# Patient Record
Sex: Female | Born: 1995 | Race: White | Hispanic: No | Marital: Married | Smoking: Former smoker
Health system: Southern US, Community
[De-identification: ages and names within clinical notes are randomized; demographics above are authoritative.]

## PROBLEM LIST (undated history)

## (undated) ENCOUNTER — Inpatient Hospital Stay (HOSPITAL_COMMUNITY): Payer: Self-pay

## (undated) DIAGNOSIS — K8020 Calculus of gallbladder without cholecystitis without obstruction: Secondary | ICD-10-CM

## (undated) DIAGNOSIS — Z8489 Family history of other specified conditions: Secondary | ICD-10-CM

## (undated) DIAGNOSIS — J40 Bronchitis, not specified as acute or chronic: Secondary | ICD-10-CM

## (undated) DIAGNOSIS — J189 Pneumonia, unspecified organism: Secondary | ICD-10-CM

---
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
## 2013-01-11 ENCOUNTER — Ambulatory Visit (INDEPENDENT_AMBULATORY_CARE_PROVIDER_SITE_OTHER): Payer: BC Managed Care – PPO | Admitting: Obstetrics & Gynecology

## 2013-01-11 VITALS — BP 126/78 | Wt 204.0 lb

## 2013-01-11 DIAGNOSIS — Z3402 Encounter for supervision of normal first pregnancy, second trimester: Secondary | ICD-10-CM

## 2013-01-11 DIAGNOSIS — Z3400 Encounter for supervision of normal first pregnancy, unspecified trimester: Secondary | ICD-10-CM

## 2013-01-11 DIAGNOSIS — O4442 Low lying placenta NOS or without hemorrhage, second trimester: Secondary | ICD-10-CM | POA: Insufficient documentation

## 2013-01-11 DIAGNOSIS — O4410 Placenta previa with hemorrhage, unspecified trimester: Secondary | ICD-10-CM

## 2013-01-11 NOTE — Patient Instructions (Signed)
Second Trimester of Pregnancy The second trimester is from week 13 through week 28, months 4 through 6. The second trimester is often a time when you feel your best. Your body has also adjusted to being pregnant, and you begin to feel better physically. Usually, morning sickness has lessened or quit completely, you may have more energy, and you may have an increase in appetite. The second trimester is also a time when the fetus is growing rapidly. At the end of the sixth month, the fetus is about 9 inches long and weighs about 1 pounds. You will likely begin to feel the baby move (quickening) between 18 and 20 weeks of the pregnancy. BODY CHANGES Your body goes through many changes during pregnancy. The changes vary from woman to woman.   Your weight will continue to increase. You will notice your lower abdomen bulging out.  You may begin to get stretch marks on your hips, abdomen, and breasts.  You may develop headaches that can be relieved by medicines approved by your caregiver.  You may urinate more often because the fetus is pressing on your bladder.  You may develop or continue to have heartburn as a result of your pregnancy.  You may develop constipation because certain hormones are causing the muscles that push waste through your intestines to slow down.  You may develop hemorrhoids or swollen, bulging veins (varicose veins).  You may have back pain because of the weight gain and pregnancy hormones relaxing your joints between the bones in your pelvis and as a result of a shift in weight and the muscles that support your balance.  Your breasts will continue to grow and be tender.  Your gums may bleed and may be sensitive to brushing and flossing.  Dark spots or blotches (chloasma, mask of pregnancy) may develop on your face. This will likely fade after the baby is born.  A dark line from your belly button to the pubic area (linea nigra) may appear. This will likely fade after the  baby is born. WHAT TO EXPECT AT YOUR PRENATAL VISITS During a routine prenatal visit:  You will be weighed to make sure you and the fetus are growing normally.  Your blood pressure will be taken.  Your abdomen will be measured to track your baby's growth.  The fetal heartbeat will be listened to.  Any test results from the previous visit will be discussed. Your caregiver may ask you:  How you are feeling.  If you are feeling the baby move.  If you have had any abnormal symptoms, such as leaking fluid, bleeding, severe headaches, or abdominal cramping.  If you have any questions. Other tests that may be performed during your second trimester include:  Blood tests that check for:  Low iron levels (anemia).  Gestational diabetes (between 24 and 28 weeks).  Rh antibodies.  Urine tests to check for infections, diabetes, or protein in the urine.  An ultrasound to confirm the proper growth and development of the baby.  An amniocentesis to check for possible genetic problems.  Fetal screens for spina bifida and Down syndrome. HOME CARE INSTRUCTIONS   Avoid all smoking, herbs, alcohol, and unprescribed drugs. These chemicals affect the formation and growth of the baby.  Follow your caregiver's instructions regarding medicine use. There are medicines that are either safe or unsafe to take during pregnancy.  Exercise only as directed by your caregiver. Experiencing uterine cramps is a good sign to stop exercising.  Continue to eat regular,   healthy meals.  Wear a good support bra for breast tenderness.  Do not use hot tubs, steam rooms, or saunas.  Wear your seat belt at all times when driving.  Avoid raw meat, uncooked cheese, cat litter boxes, and soil used by cats. These carry germs that can cause birth defects in the baby.  Take your prenatal vitamins.  Try taking a stool softener (if your caregiver approves) if you develop constipation. Eat more high-fiber foods,  such as fresh vegetables or fruit and whole grains. Drink plenty of fluids to keep your urine clear or pale yellow.  Take warm sitz baths to soothe any pain or discomfort caused by hemorrhoids. Use hemorrhoid cream if your caregiver approves.  If you develop varicose veins, wear support hose. Elevate your feet for 15 minutes, 3 4 times a day. Limit salt in your diet.  Avoid heavy lifting, wear low heel shoes, and practice good posture.  Rest with your legs elevated if you have leg cramps or low back pain.  Visit your dentist if you have not gone yet during your pregnancy. Use a soft toothbrush to brush your teeth and be gentle when you floss.  A sexual relationship may be continued unless your caregiver directs you otherwise.  Continue to go to all your prenatal visits as directed by your caregiver. SEEK MEDICAL CARE IF:   You have dizziness.  You have mild pelvic cramps, pelvic pressure, or nagging pain in the abdominal area.  You have persistent nausea, vomiting, or diarrhea.  You have a bad smelling vaginal discharge.  You have pain with urination. SEEK IMMEDIATE MEDICAL CARE IF:   You have a fever.  You are leaking fluid from your vagina.  You have spotting or bleeding from your vagina.  You have severe abdominal cramping or pain.  You have rapid weight gain or loss.  You have shortness of breath with chest pain.  You notice sudden or extreme swelling of your face, hands, ankles, feet, or legs.  You have not felt your baby move in over an hour.  You have severe headaches that do not go away with medicine.  You have vision changes. Document Released: 01/22/2001 Document Revised: 09/30/2012 Document Reviewed: 03/31/2012 [employer] Patient Information 2014 [employer].  

## 2013-01-11 NOTE — Progress Notes (Signed)
P-89 

## 2013-01-11 NOTE — Progress Notes (Signed)
[place] in 2 weeks f/u low-lying placenta. 1 hr GTT 3 weeks.

## 2013-01-19 ENCOUNTER — Telehealth: Payer: Self-pay | Admitting: *Deleted

## 2013-01-19 NOTE — Telephone Encounter (Signed)
Pt's mother called in to adv pt had "white bumps in her throat" 2 Friday's ago and a few days after noticing the bumps the pt began having diarrhea. Pt's mother adv she didn't think she could be seen by PCP since she was pregnant. I adv pt's mother that I would call PCP to see if they would see her since Dr. Debroah Loop wouldn't be in [place] for another week. I called Nicola Girt at [facility] and they agreed to see pt for sore throat and diarrhea. I called Meriam Sprague (Pt's mother) back to adv to call [facility] to make appt. She expressed understanding.

## 2013-01-29 ENCOUNTER — Ambulatory Visit (HOSPITAL_COMMUNITY)
Admission: RE | Admit: 2013-01-29 | Discharge: 2013-01-29 | Disposition: A | Discharge status: Home / Self Care | Payer: BC Managed Care – PPO | Source: Ambulatory Visit | Attending: Obstetrics & Gynecology | Admitting: Obstetrics & Gynecology

## 2013-01-29 DIAGNOSIS — Z3689 Encounter for other specified antenatal screening: Secondary | ICD-10-CM | POA: Insufficient documentation

## 2013-01-29 DIAGNOSIS — O4400 Placenta previa specified as without hemorrhage, unspecified trimester: Secondary | ICD-10-CM | POA: Insufficient documentation

## 2013-01-29 DIAGNOSIS — Z3402 Encounter for supervision of normal first pregnancy, second trimester: Secondary | ICD-10-CM

## 2013-02-02 ENCOUNTER — Ambulatory Visit (INDEPENDENT_AMBULATORY_CARE_PROVIDER_SITE_OTHER): Payer: BC Managed Care – PPO | Admitting: Obstetrics & Gynecology

## 2013-02-02 VITALS — BP 121/76 | Wt 201.0 lb

## 2013-02-02 DIAGNOSIS — Z3482 Encounter for supervision of other normal pregnancy, second trimester: Secondary | ICD-10-CM

## 2013-02-02 DIAGNOSIS — Z3480 Encounter for supervision of other normal pregnancy, unspecified trimester: Secondary | ICD-10-CM

## 2013-02-02 DIAGNOSIS — N898 Other specified noninflammatory disorders of vagina: Secondary | ICD-10-CM

## 2013-02-02 LAB — CBC
HCT: 37 % (ref 36.0–46.0)
Hemoglobin: 12.6 g/dL (ref 12.0–15.0)
MCH: 27.8 pg (ref 26.0–34.0)
MCV: 81.5 fL (ref 78.0–100.0)
Platelets: 215 10*3/uL (ref 150–400)
RBC: 4.54 MIL/uL (ref 3.87–5.11)
RDW: 13.8 % (ref 11.5–15.5)
WBC: 7.9 10*3/uL (ref 4.0–10.5)

## 2013-02-02 NOTE — Progress Notes (Signed)
P - 92 - Pt states she has heavy discharge and some vaginal itching - 1Hr GTT today

## 2013-02-02 NOTE — Addendum Note (Signed)
Addended by: Arne Cleveland on: 02/02/2013 01:27 PM   Modules accepted: Orders

## 2013-02-02 NOTE — Progress Notes (Signed)
Itch and vaginal discharge, no odor. Nasal congestion, no fever. No discharge on exam, no ROM, wet prep done. [place] nl no previa

## 2013-02-02 NOTE — Patient Instructions (Signed)
Third Trimester of Pregnancy  The third trimester is from week 29 through week 42, months 7 through 9. The third trimester is a time when the fetus is growing rapidly. At the end of the ninth month, the fetus is about 20 inches in length and weighs 6 10 pounds.   BODY CHANGES  Your body goes through many changes during pregnancy. The changes vary from woman to woman.    Your weight will continue to increase. You can expect to gain 25 35 pounds (11 16 kg) by the end of the pregnancy.   You may begin to get stretch marks on your hips, abdomen, and breasts.   You may urinate more often because the fetus is moving lower into your pelvis and pressing on your bladder.   You may develop or continue to have heartburn as a result of your pregnancy.   You may develop constipation because certain hormones are causing the muscles that push waste through your intestines to slow down.   You may develop hemorrhoids or swollen, bulging veins (varicose veins).   You may have pelvic pain because of the weight gain and pregnancy hormones relaxing your joints between the bones in your pelvis. Back aches may result from over exertion of the muscles supporting your posture.   Your breasts will continue to grow and be tender. A yellow discharge may leak from your breasts called colostrum.   Your belly button may stick out.   You may feel short of breath because of your expanding uterus.   You may notice the fetus "dropping," or moving lower in your abdomen.   You may have a bloody mucus discharge. This usually occurs a few days to a week before labor begins.   Your cervix becomes thin and soft (effaced) near your due date.  WHAT TO EXPECT AT YOUR PRENATAL EXAMS   You will have prenatal exams every 2 weeks until week 36. Then, you will have weekly prenatal exams. During a routine prenatal visit:   You will be weighed to make sure you and the fetus are growing normally.   Your blood pressure is taken.   Your abdomen will be  measured to track your baby's growth.   The fetal heartbeat will be listened to.   Any test results from the previous visit will be discussed.   You may have a cervical check near your due date to see if you have effaced.  At around 36 weeks, your caregiver will check your cervix. At the same time, your caregiver will also perform a test on the secretions of the vaginal tissue. This test is to determine if a type of bacteria, Group B streptococcus, is present. Your caregiver will explain this further.  Your caregiver may ask you:   What your birth plan is.   How you are feeling.   If you are feeling the baby move.   If you have had any abnormal symptoms, such as leaking fluid, bleeding, severe headaches, or abdominal cramping.   If you have any questions.  Other tests or screenings that may be performed during your third trimester include:   Blood tests that check for low iron levels (anemia).   Fetal testing to check the health, activity level, and growth of the fetus. Testing is done if you have certain medical conditions or if there are problems during the pregnancy.  FALSE LABOR  You may feel small, irregular contractions that eventually go away. These are called Braxton Hicks contractions, or   false labor. Contractions may last for hours, days, or even weeks before true labor sets in. If contractions come at regular intervals, intensify, or become painful, it is best to be seen by your caregiver.   SIGNS OF LABOR    Menstrual-like cramps.   Contractions that are 5 minutes apart or less.   Contractions that start on the top of the uterus and spread down to the lower abdomen and back.   A sense of increased pelvic pressure or back pain.   A watery or bloody mucus discharge that comes from the vagina.  If you have any of these signs before the 37th week of pregnancy, call your caregiver right away. You need to go to the hospital to get checked immediately.  HOME CARE INSTRUCTIONS    Avoid all  smoking, herbs, alcohol, and unprescribed drugs. These chemicals affect the formation and growth of the baby.   Follow your caregiver's instructions regarding medicine use. There are medicines that are either safe or unsafe to take during pregnancy.   Exercise only as directed by your caregiver. Experiencing uterine cramps is a good sign to stop exercising.   Continue to eat regular, healthy meals.   Wear a good support bra for breast tenderness.   Do not use hot tubs, steam rooms, or saunas.   Wear your seat belt at all times when driving.   Avoid raw meat, uncooked cheese, cat litter boxes, and soil used by cats. These carry germs that can cause birth defects in the baby.   Take your prenatal vitamins.   Try taking a stool softener (if your caregiver approves) if you develop constipation. Eat more high-fiber foods, such as fresh vegetables or fruit and whole grains. Drink plenty of fluids to keep your urine clear or pale yellow.   Take warm sitz baths to soothe any pain or discomfort caused by hemorrhoids. Use hemorrhoid cream if your caregiver approves.   If you develop varicose veins, wear support hose. Elevate your feet for 15 minutes, 3 4 times a day. Limit salt in your diet.   Avoid heavy lifting, wear low heal shoes, and practice good posture.   Rest a lot with your legs elevated if you have leg cramps or low back pain.   Visit your dentist if you have not gone during your pregnancy. Use a soft toothbrush to brush your teeth and be gentle when you floss.   A sexual relationship may be continued unless your caregiver directs you otherwise.   Do not travel far distances unless it is absolutely necessary and only with the approval of your caregiver.   Take prenatal classes to understand, practice, and ask questions about the labor and delivery.   Make a trial run to the hospital.   Pack your hospital bag.   Prepare the baby's nursery.   Continue to go to all your prenatal visits as directed  by your caregiver.  SEEK MEDICAL CARE IF:   You are unsure if you are in labor or if your water has broken.   You have dizziness.   You have mild pelvic cramps, pelvic pressure, or nagging pain in your abdominal area.   You have persistent nausea, vomiting, or diarrhea.   You have a bad smelling vaginal discharge.   You have pain with urination.  SEEK IMMEDIATE MEDICAL CARE IF:    You have a fever.   You are leaking fluid from your vagina.   You have spotting or bleeding from your vagina.     You have severe abdominal cramping or pain.   You have rapid weight loss or gain.   You have shortness of breath with chest pain.   You notice sudden or extreme swelling of your face, hands, ankles, feet, or legs.   You have not felt your baby move in over an hour.   You have severe headaches that do not go away with medicine.   You have vision changes.  Document Released: 01/22/2001 Document Revised: 09/30/2012 Document Reviewed: 03/31/2012  [employer] Patient Information 2014 [employer].

## 2013-02-03 LAB — HIV ANTIBODY (ROUTINE TESTING W REFLEX): HIV: NONREACTIVE

## 2013-02-11 NOTE — L&D Delivery Note (Signed)
Delivery Note At 7:40 AM a healthy female was delivered via Vaginal, Spontaneous Delivery (Presentation: Left Occiput Anterior).  APGAR: 8, 9; weight .   Placenta status: Intact, Spontaneous.  Cord: 3 vessels with the following complications: None.  Cord pH: obtained and pending  Anesthesia: Epidural  Episiotomy: None Lacerations: none Suture Repair: na Est. Blood Loss (350mL):   Mom to postpartum.  Baby to Couplet care / Skin to Skin.  Wenda LowJoyner, James 04/13/2013, 7:52 AM  I have seen and examined this patient and agree with above documentation in the resident's note. Pt pushed with good maternal effort to deliver a liveborn female over intact perineum. Compound presentation of a hand noted and reduced on the perineum. Baby delivered without difficulty. Baby to maternal abdomen. Delayed cord clamping performed and cut by FOB. Placenta delivered intact with 3V cord. No tears or complications. Mom to postpartum and baby to skin to skin.   Rulon AbideKeli Lyla Jasek, M.D. [facility] 04/13/2013 8:37 AM

## 2013-02-15 ENCOUNTER — Ambulatory Visit (INDEPENDENT_AMBULATORY_CARE_PROVIDER_SITE_OTHER): Payer: BC Managed Care – PPO | Admitting: Obstetrics & Gynecology

## 2013-02-15 VITALS — BP 122/75 | Wt 204.0 lb

## 2013-02-15 DIAGNOSIS — Z3403 Encounter for supervision of normal first pregnancy, third trimester: Secondary | ICD-10-CM

## 2013-02-15 DIAGNOSIS — Z3400 Encounter for supervision of normal first pregnancy, unspecified trimester: Secondary | ICD-10-CM

## 2013-02-15 NOTE — Progress Notes (Signed)
Considering water birth. Will sign up for class

## 2013-02-15 NOTE — Patient Instructions (Signed)
Third Trimester of Pregnancy  The third trimester is from week 29 through week 42, months 7 through 9. The third trimester is a time when the fetus is growing rapidly. At the end of the ninth month, the fetus is about 20 inches in length and weighs 6 10 pounds.   BODY CHANGES  Your body goes through many changes during pregnancy. The changes vary from woman to woman.    Your weight will continue to increase. You can expect to gain 25 35 pounds (11 16 kg) by the end of the pregnancy.   You may begin to get stretch marks on your hips, abdomen, and breasts.   You may urinate more often because the fetus is moving lower into your pelvis and pressing on your bladder.   You may develop or continue to have heartburn as a result of your pregnancy.   You may develop constipation because certain hormones are causing the muscles that push waste through your intestines to slow down.   You may develop hemorrhoids or swollen, bulging veins (varicose veins).   You may have pelvic pain because of the weight gain and pregnancy hormones relaxing your joints between the bones in your pelvis. Back aches may result from over exertion of the muscles supporting your posture.   Your breasts will continue to grow and be tender. A yellow discharge may leak from your breasts called colostrum.   Your belly button may stick out.   You may feel short of breath because of your expanding uterus.   You may notice the fetus "dropping," or moving lower in your abdomen.   You may have a bloody mucus discharge. This usually occurs a few days to a week before labor begins.   Your cervix becomes thin and soft (effaced) near your due date.  WHAT TO EXPECT AT YOUR PRENATAL EXAMS   You will have prenatal exams every 2 weeks until week 36. Then, you will have weekly prenatal exams. During a routine prenatal visit:   You will be weighed to make sure you and the fetus are growing normally.   Your blood pressure is taken.   Your abdomen will be  measured to track your baby's growth.   The fetal heartbeat will be listened to.   Any test results from the previous visit will be discussed.   You may have a cervical check near your due date to see if you have effaced.  At around 36 weeks, your caregiver will check your cervix. At the same time, your caregiver will also perform a test on the secretions of the vaginal tissue. This test is to determine if a type of bacteria, Group B streptococcus, is present. Your caregiver will explain this further.  Your caregiver may ask you:   What your birth plan is.   How you are feeling.   If you are feeling the baby move.   If you have had any abnormal symptoms, such as leaking fluid, bleeding, severe headaches, or abdominal cramping.   If you have any questions.  Other tests or screenings that may be performed during your third trimester include:   Blood tests that check for low iron levels (anemia).   Fetal testing to check the health, activity level, and growth of the fetus. Testing is done if you have certain medical conditions or if there are problems during the pregnancy.  FALSE LABOR  You may feel small, irregular contractions that eventually go away. These are called Braxton Hicks contractions, or   false labor. Contractions may last for hours, days, or even weeks before true labor sets in. If contractions come at regular intervals, intensify, or become painful, it is best to be seen by your caregiver.   SIGNS OF LABOR    Menstrual-like cramps.   Contractions that are 5 minutes apart or less.   Contractions that start on the top of the uterus and spread down to the lower abdomen and back.   A sense of increased pelvic pressure or back pain.   A watery or bloody mucus discharge that comes from the vagina.  If you have any of these signs before the 37th week of pregnancy, call your caregiver right away. You need to go to the hospital to get checked immediately.  HOME CARE INSTRUCTIONS    Avoid all  smoking, herbs, alcohol, and unprescribed drugs. These chemicals affect the formation and growth of the baby.   Follow your caregiver's instructions regarding medicine use. There are medicines that are either safe or unsafe to take during pregnancy.   Exercise only as directed by your caregiver. Experiencing uterine cramps is a good sign to stop exercising.   Continue to eat regular, healthy meals.   Wear a good support bra for breast tenderness.   Do not use hot tubs, steam rooms, or saunas.   Wear your seat belt at all times when driving.   Avoid raw meat, uncooked cheese, cat litter boxes, and soil used by cats. These carry germs that can cause birth defects in the baby.   Take your prenatal vitamins.   Try taking a stool softener (if your caregiver approves) if you develop constipation. Eat more high-fiber foods, such as fresh vegetables or fruit and whole grains. Drink plenty of fluids to keep your urine clear or pale yellow.   Take warm sitz baths to soothe any pain or discomfort caused by hemorrhoids. Use hemorrhoid cream if your caregiver approves.   If you develop varicose veins, wear support hose. Elevate your feet for 15 minutes, 3 4 times a day. Limit salt in your diet.   Avoid heavy lifting, wear low heal shoes, and practice good posture.   Rest a lot with your legs elevated if you have leg cramps or low back pain.   Visit your dentist if you have not gone during your pregnancy. Use a soft toothbrush to brush your teeth and be gentle when you floss.   A sexual relationship may be continued unless your caregiver directs you otherwise.   Do not travel far distances unless it is absolutely necessary and only with the approval of your caregiver.   Take prenatal classes to understand, practice, and ask questions about the labor and delivery.   Make a trial run to the hospital.   Pack your hospital bag.   Prepare the baby's nursery.   Continue to go to all your prenatal visits as directed  by your caregiver.  SEEK MEDICAL CARE IF:   You are unsure if you are in labor or if your water has broken.   You have dizziness.   You have mild pelvic cramps, pelvic pressure, or nagging pain in your abdominal area.   You have persistent nausea, vomiting, or diarrhea.   You have a bad smelling vaginal discharge.   You have pain with urination.  SEEK IMMEDIATE MEDICAL CARE IF:    You have a fever.   You are leaking fluid from your vagina.   You have spotting or bleeding from your vagina.     You have severe abdominal cramping or pain.   You have rapid weight loss or gain.   You have shortness of breath with chest pain.   You notice sudden or extreme swelling of your face, hands, ankles, feet, or legs.   You have not felt your baby move in over an hour.   You have severe headaches that do not go away with medicine.   You have vision changes.  Document Released: 01/22/2001 Document Revised: 09/30/2012 Document Reviewed: 03/31/2012  [employer] Patient Information 2014 [employer].

## 2013-02-15 NOTE — Progress Notes (Signed)
P - 86 - Pt interested in Water birth

## 2013-03-02 ENCOUNTER — Encounter: Payer: BC Managed Care – PPO | Admitting: Obstetrics & Gynecology

## 2013-03-09 ENCOUNTER — Ambulatory Visit (INDEPENDENT_AMBULATORY_CARE_PROVIDER_SITE_OTHER): Payer: BC Managed Care – PPO | Admitting: Obstetrics & Gynecology

## 2013-03-09 VITALS — BP 130/90 | Wt 204.0 lb

## 2013-03-09 DIAGNOSIS — Z3400 Encounter for supervision of normal first pregnancy, unspecified trimester: Secondary | ICD-10-CM

## 2013-03-09 NOTE — Patient Instructions (Signed)
Preeclampsia and Eclampsia °Preeclampsia is a condition of high blood pressure during pregnancy. It can happen at 20 weeks or later in pregnancy. If high blood pressure occurs in the second half of pregnancy with no other symptoms, it is called gestational hypertension and goes away after the baby is born. If any of the symptoms listed below develop with gestational hypertension, it is then called preeclampsia. Eclampsia (convulsions) may follow preeclampsia. This is one of the reasons for regular prenatal checkups. Early diagnosis and treatment are very important to prevent eclampsia. °CAUSES  °There is no known cause of preeclampsia/eclampsia in pregnancy. There are several known conditions that may put the pregnant woman at risk, such as: °· The first pregnancy. °· Having preeclampsia in a past pregnancy. °· Having lasting (chronic) high blood pressure. °· Having multiples (twins, triplets). °· Being age 35 or older. °· African American ethnic background. °· Having kidney disease or diabetes. °· Medical conditions such as lupus or blood diseases. °· Being overweight (obese). °SYMPTOMS  °· High blood pressure. °· Headaches. °· Sudden weight gain. °· Swelling of hands, face, legs, and feet. °· Protein in the urine. °· Feeling sick to your stomach (nauseous) and throwing up (vomiting). °· Vision problems (blurred or double vision). °· Numbness in the face, arms, legs, and feet. °· Dizziness. °· Slurred speech. °· Preeclampsia can cause growth retardation in the fetus. °· Separation (abruption) of the placenta. °· Not enough fluid in the amniotic sac (oligohydramnios). °· Sensitivity to bright lights. °· Belly (abdominal) pain. °DIAGNOSIS  °If protein is found in the urine in the second half of pregnancy, this is considered preeclampsia. Other symptoms mentioned above may also be present. °TREATMENT  °It is necessary to treat this. °· Your caregiver may prescribe bed rest early in this condition. Plenty of rest and  salt restriction may be all that is needed. °· Medicines may be necessary to lower blood pressure if the condition does not respond to more conservative measures. °· In more severe cases, hospitalization may be needed: °· For treatment of blood pressure. °· To control fluid retention. °· To monitor the baby to see if the condition is causing harm to the baby. °· Hospitalization is the best way to treat the first sign of preeclampsia. This is so the mother and baby can be watched closely and blood tests can be done effectively and correctly. °· If the condition becomes severe, it may be necessary to induce labor or to remove the infant by surgical means (cesarean section). The best cure for preeclampsia/eclampsia is to deliver the baby. °Preeclampsia and eclampsia involve risks to mother and infant. Your caregiver will discuss these risks with you. Together, you can work out the best possible approach to your problems. Make sure you keep your prenatal visits as scheduled. Not keeping appointments could result in a chronic or permanent injury, pain, disability to you, and death or injury to you or your unborn baby. If there is any problem keeping the appointment, you must call to reschedule. °HOME CARE INSTRUCTIONS  °· Keep your prenatal appointments and tests as scheduled. °· Tell your caregiver if you have any of the above risk factors. °· Get plenty of rest and sleep. °· Eat a balanced diet that is low in salt, and do not add salt to your food. °· Avoid stressful situations. °· Only take over-the-counter and prescriptions medicines for pain, discomfort, or fever as directed by your caregiver. °SEEK IMMEDIATE MEDICAL CARE IF:  °· You develop severe swelling   anywhere in the body. This usually occurs in the legs. °· You gain 05 lb/2.3 kg or more in a week. °· You develop a severe headache, dizziness, problems with your vision, or confusion. °· You have abdominal pain, nausea, or vomiting. °· You have a seizure. °· You  have trouble moving any part of your body, or you develop numbness or problems speaking. °· You have bruising or abnormal bleeding from anywhere in the body. °· You develop a stiff neck. °· You pass out. °MAKE SURE YOU:  °· Understand these instructions. °· Will watch your condition. °· Will get help right away if you are not doing well or get worse. °Document Released: 01/26/2000 Document Revised: 04/22/2011 Document Reviewed: 09/11/2007 °ExitCare® Patient Information ©2014 [employer]. ° °

## 2013-03-09 NOTE — Progress Notes (Signed)
RTC 1 week. PIH precautions.

## 2013-03-09 NOTE — Progress Notes (Signed)
P - 108 BP recheck - 118//78 P- 96

## 2013-03-15 ENCOUNTER — Inpatient Hospital Stay (HOSPITAL_COMMUNITY)
Admission: AD | Admit: 2013-03-15 | Discharge: 2013-03-15 | Disposition: A | Discharge status: Home / Self Care | Payer: BC Managed Care – PPO | Source: Ambulatory Visit | Attending: Obstetrics & Gynecology | Admitting: Obstetrics & Gynecology

## 2013-03-15 ENCOUNTER — Encounter: Payer: BC Managed Care – PPO | Admitting: Obstetrics & Gynecology

## 2013-03-15 ENCOUNTER — Inpatient Hospital Stay (HOSPITAL_COMMUNITY): Payer: BC Managed Care – PPO

## 2013-03-15 ENCOUNTER — Encounter (HOSPITAL_COMMUNITY): Payer: Self-pay | Admitting: *Deleted

## 2013-03-15 DIAGNOSIS — K8020 Calculus of gallbladder without cholecystitis without obstruction: Secondary | ICD-10-CM

## 2013-03-15 DIAGNOSIS — R748 Abnormal levels of other serum enzymes: Secondary | ICD-10-CM | POA: Insufficient documentation

## 2013-03-15 DIAGNOSIS — R0789 Other chest pain: Secondary | ICD-10-CM

## 2013-03-15 DIAGNOSIS — O9989 Other specified diseases and conditions complicating pregnancy, childbirth and the puerperium: Secondary | ICD-10-CM | POA: Insufficient documentation

## 2013-03-15 DIAGNOSIS — Z87891 Personal history of nicotine dependence: Secondary | ICD-10-CM | POA: Insufficient documentation

## 2013-03-15 DIAGNOSIS — M545 Low back pain, unspecified: Secondary | ICD-10-CM | POA: Insufficient documentation

## 2013-03-15 DIAGNOSIS — R109 Unspecified abdominal pain: Secondary | ICD-10-CM | POA: Insufficient documentation

## 2013-03-15 LAB — CBC
HCT: 36.6 % (ref 36.0–46.0)
HEMOGLOBIN: 12.2 g/dL (ref 12.0–15.0)
MCH: 27.2 pg (ref 26.0–34.0)
MCHC: 33.3 g/dL (ref 30.0–36.0)
MCV: 81.7 fL (ref 78.0–100.0)
Platelets: 177 10*3/uL (ref 150–400)
RBC: 4.48 MIL/uL (ref 3.87–5.11)
RDW: 13.2 % (ref 11.5–15.5)
WBC: 7.3 10*3/uL (ref 4.0–10.5)

## 2013-03-15 LAB — COMPREHENSIVE METABOLIC PANEL
ALBUMIN: 2.7 g/dL — AB (ref 3.5–5.2)
ALK PHOS: 223 U/L — AB (ref 39–117)
ALT: 56 U/L — AB (ref 0–35)
AST: 97 U/L — AB (ref 0–37)
BUN: 3 mg/dL — ABNORMAL LOW (ref 6–23)
CALCIUM: 8.9 mg/dL (ref 8.4–10.5)
CO2: 24 mEq/L (ref 19–32)
Chloride: 102 mEq/L (ref 96–112)
Creatinine, Ser: 0.57 mg/dL (ref 0.50–1.10)
GFR calc non Af Amer: >90 mL/min (ref >90)
Glucose, Bld: 95 mg/dL (ref 70–99)
POTASSIUM: 4 meq/L (ref 3.7–5.3)
SODIUM: 137 meq/L (ref 137–147)
TOTAL PROTEIN: 7.3 g/dL (ref 6.0–8.3)
Total Bilirubin: 0.8 mg/dL (ref 0.3–1.2)

## 2013-03-15 LAB — URINALYSIS, ROUTINE W REFLEX MICROSCOPIC
BILIRUBIN URINE: NEGATIVE
Glucose, UA: NEGATIVE mg/dL
Hgb urine dipstick: NEGATIVE
KETONES UR: NEGATIVE mg/dL
LEUKOCYTES UA: NEGATIVE
NITRITE: NEGATIVE
PH: 7 (ref 5.0–8.0)
Protein, ur: NEGATIVE mg/dL
Specific Gravity, Urine: 1.015 (ref 1.005–1.030)
UROBILINOGEN UA: 2 mg/dL — AB (ref 0.0–1.0)

## 2013-03-15 MED ORDER — CYCLOBENZAPRINE HCL 5 MG PO TABS: 5.0000 mg | ORAL_TABLET | Freq: Three times a day (TID) | ORAL | Status: DC | PRN

## 2013-03-15 MED ORDER — OXYCODONE-ACETAMINOPHEN 5-325 MG PO TABS: 1.0000 | ORAL_TABLET | ORAL | Status: DC | PRN

## 2013-03-15 NOTE — MAU Provider Note (Signed)
Attestation of Attending Supervision of Advanced Practitioner (CNM/NP): Evaluation and management procedures were performed by the Advanced Practitioner under my supervision and collaboration.  I have reviewed the Advanced Practitioner's note and chart, and I agree with the management and plan.  HARRAWAY-SMITH, Janell Keeling 4:32 PM

## 2013-03-15 NOTE — Discharge Instructions (Signed)
Cholelithiasis °Cholelithiasis (also called gallstones) is a form of gallbladder disease in which gallstones form in your gallbladder. The gallbladder is an organ that stores bile made in the liver, which helps digest fats. Gallstones begin as small crystals and slowly grow into stones. Gallstone pain occurs when the gallbladder spasms and a gallstone is blocking the duct. Pain can also occur when a stone passes out of the duct.  °RISK FACTORS °· Being female.   °· Having multiple pregnancies. Health care providers sometimes advise removing diseased gallbladders before future pregnancies.   °· Being obese. °· Eating a diet heavy in fried foods and fat.   °· Being older than 60 years and increasing age.   °· Prolonged use of medicines containing female hormones.   °· Having diabetes mellitus.   °· Rapidly losing weight.   °· Having a family history of gallstones (heredity).   °SYMPTOMS °· Nausea.   °· Vomiting. °· Abdominal pain.   °· Yellowing of the skin (jaundice).   °· Sudden pain. It may persist from several minutes to several hours. °· Fever.   °· Tenderness to the touch.  °In some cases, when gallstones do not move into the bile duct, people have no pain or symptoms. These are called "silent" gallstones.  °TREATMENT °Silent gallstones do not need treatment. In severe cases, emergency surgery may be required. Options for treatment include: °· Surgery to remove the gallbladder. This is the most common treatment. °· Medicines. These do not always work and may take 6 12 months or more to work. °· Shock wave treatment (extracorporeal biliary lithotripsy). In this treatment an ultrasound machine sends shock waves to the gallbladder to break gallstones into smaller pieces that can pass into the intestines or be dissolved by medicine. °HOME CARE INSTRUCTIONS  °· Only take over-the-counter or prescription medicines for pain, discomfort, or fever as directed by your health care provider.   °· Follow a low-fat diet until  seen again by your health care provider. Fat causes the gallbladder to contract, which can result in pain.   °· Follow up with your health care provider as directed. Attacks are almost always recurrent and surgery is usually required for permanent treatment.   °SEEK IMMEDIATE MEDICAL CARE IF:  °· Your pain increases and is not controlled by medicines.   °· You have a fever or persistent symptoms for more than 2 3 days.   °· You have a fever and your symptoms suddenly get worse.   °· You have persistent nausea and vomiting.   °MAKE SURE YOU:  °· Understand these instructions. °· Will watch your condition. °· Will get help right away if you are not doing well or get worse. °Document Released: 01/24/2005 Document Revised: 09/30/2012 Document Reviewed: 07/22/2012 °ExitCare® Patient Information ©2014 [employer]. ° °

## 2013-03-15 NOTE — MAU Provider Note (Signed)
 @MAUPATCONTACT @  Chief Complaint:  Bilateral Upper quadrant pain(lower rib) pain  Sydney [name] is  18 y.o. G1P0 at 2962w6d presents complaining of Bilateral Upper quadrant(lower rib) pain.   Pt complains of bilateral upper quadrant(lower rib) pain. 1st attack was on Saturday, sever 9/10 lasted 30 mins. Spontaneous onset, no aggravating factors, relieved by laying down or sitting al the way up. Pt describes the pain as sharp and stabbing. Also complains of lower back pain. Had another episode today morning. Currently pt is pain free with no complaints. She suffered from a similar complaint in her 2nd trimester, she was told it was related to the uterus enlarging Denies any respiratory, GI or urinary symptoms Denies uterine contractions, +fetal movt (says baby moves more during pain episodes), no Vag bleeding or LOF  Obstetrical/Gynecological History: OB History   Grav Para Term Preterm Abortions TAB SAB Ect Mult Living   1              Past Medical History: Past Medical History  Diagnosis Date  . Medical history non-contributory     Past Surgical History: Past Surgical History  Procedure Laterality Date  . No past surgeries      Family History: Family History  Problem Relation Age of Onset  . Healthy Mother   . Healthy Father   . Healthy Brother   . Hypertension Maternal Grandmother   . Stroke Maternal Grandfather   . Diabetes Paternal Grandmother   . Diabetes Paternal Grandfather     Social History: History  Substance Use Topics  . Smoking status: Former Smoker -- 1.00 packs/day    Types: Cigarettes  . Smokeless tobacco: Not on file  . Alcohol Use: No    Allergies:  Allergies  Allergen Reactions  . Latex     Meds:  Prescriptions prior to admission  Medication Sig Dispense Refill  . Prenatal Vit-Fe Fumarate-FA (MULTIVITAMIN-PRENATAL) 27-0.8 MG TABS tablet Take 1 tablet by mouth daily at 12 noon.  30 each  5    Review of Systems -   Review of Systems   Constitutional: Negative for fever, chills,  HENT: Negative for , nosebleeds, congestion, sore throat, neck pain, tinnitus and ear discharge.   Eyes: Negative for blurred vision, double vision, photophobia, pain, discharge and redness.  Respiratory: Negative for cough, hemoptysis, sputum production, shortness of breath, wheezing and stridor.   Cardiovascular: Negative for chest pain, palpitations Gastrointestinal: Negative for abdominal pain heartburn, nausea, vomiting, diarrhea, constipation, blood in stool Genitourinary: Negative for dysuria, urgency, frequency, hematuria and flank pain.  Musculoskeletal: Negative for myalgias,  joint pain and falls.  positive for mid back pain  Physical Exam  Blood pressure 126/70, pulse 96, temperature 97.6 F (36.4 C), temperature source Oral, resp. rate 18, last menstrual period 07/21/2012. GENERAL: Well-developed, well-nourished female in no acute distress.  LUNGS: Clear to auscultation bilaterally.  HEART: Regular rate and rhythm. ABDOMEN: Soft, nondistended, gravid. Mild Tenderness in the left upper quadrant FHT:  Baseline rate 130 bpm   Variability moderate  Accelerations present   Decelerations none Contractions: none   Labs: No results found for this or any previous visit (from the past 24 hour(s)). Imaging Studies:  No results found.  Assessment: Sydney [name] is  18 y.o. G1P0 at 4562w6d presents with bilateral upper quadrant(lower rib) pain History and P.E suggests musculoskeletal origin  Plan: Pt reassurance Prescription for Pain Relief  Sallyanne HaversMuazu, Aisha 2/2/20159:03 AM  Evaluation and management procedures were performed by Resident physician under my supervision/collaboration. Chart reviewed,  patient examined by me and I agree with management and plan.  ADDENDUM: SUBJECTIVE: Has had the pain since 3 months gestation and this episode Had vomiting x2 this am. No pain at rest while in MAU. Marland Kitchen Also has reflux sx on occasion and has not  yet tried Tums.  OBJECTIVE: Costal margin mild TTP bilaterally. No epigastric or fundal tenderness.  Results for orders placed during the hospital encounter of 03/15/13 (from the past 24 hour(s))  URINALYSIS, ROUTINE W REFLEX MICROSCOPIC     Status: Abnormal   Collection Time    03/15/13  8:28 AM      Result Value Range   Color, Urine YELLOW  YELLOW   APPearance CLOUDY (*) CLEAR   Specific Gravity, Urine 1.015  1.005 - 1.030   pH 7.0  5.0 - 8.0   Glucose, UA NEGATIVE  NEGATIVE mg/dL   Hgb urine dipstick NEGATIVE  NEGATIVE   Bilirubin Urine NEGATIVE  NEGATIVE   Ketones, ur NEGATIVE  NEGATIVE mg/dL   Protein, ur NEGATIVE  NEGATIVE mg/dL   Urobilinogen, UA 2.0 (*) 0.0 - 1.0 mg/dL   Nitrite NEGATIVE  NEGATIVE   Leukocytes, UA NEGATIVE  NEGATIVE  CBC     Status: None   Collection Time    03/15/13 10:08 AM      Result Value Range   WBC 7.3  4.0 - 10.5 K/uL   RBC 4.48  3.87 - 5.11 MIL/uL   Hemoglobin 12.2  12.0 - 15.0 g/dL   HCT 16.1  09.6 - 04.5 %   MCV 81.7  78.0 - 100.0 fL   MCH 27.2  26.0 - 34.0 pg   MCHC 33.3  30.0 - 36.0 g/dL   RDW 40.9  81.1 - 91.4 %   Platelets 177  150 - 400 K/uL  COMPREHENSIVE METABOLIC PANEL     Status: Abnormal   Collection Time    03/15/13 10:08 AM      Result Value Range   Sodium 137  137 - 147 mEq/L   Potassium 4.0  3.7 - 5.3 mEq/L   Chloride 102  96 - 112 mEq/L   CO2 24  19 - 32 mEq/L   Glucose, Bld 95  70 - 99 mg/dL   BUN 3 (*) 6 - 23 mg/dL   Creatinine, Ser 7.82  0.50 - 1.10 mg/dL   Calcium 8.9  8.4 - 95.6 mg/dL   Total Protein 7.3  6.0 - 8.3 g/dL   Albumin 2.7 (*) 3.5 - 5.2 g/dL   AST 97 (*) 0 - 37 U/L   ALT 56 (*) 0 - 35 U/L   Alkaline Phosphatase 223 (*) 39 - 117 U/L   Total Bilirubin 0.8  0.3 - 1.2 mg/dL   GFR calc non Af Amer >90  >90 mL/min   GFR calc Af Amer >90  >90 mL/min  Will get GB US due to elevated LFTs   CLINICAL DATA: Upper abdominal pain. Elevated function tests.  EXAM:  US ABDOMEN LIMITED - RIGHT UPPER QUADRANT   COMPARISON: None.  FINDINGS:  Gallbladder:  Multiple mole wall gallstones. Gallbladder is only mildly distended.  There is no wall thickening. There is no pericholecystic fluid.  Common bile duct:  Diameter: 2.5 mm.  Liver:  No focal lesion identified. Within normal limits in parenchymal  echogenicity.  IMPRESSION:  Cholelithiasis with no evidence of acute cholecystitis. No other  abnormalities.  Electronically Signed  By: Amie Portland M.D.  On: 03/15/2013 12:23  D/W Dr. Burnice Logan Katrinka Blazing  ASSESSMENT: 1. Musculoskeletal chest pain   2. Elevated liver enzymes   3. Gallstones without obstruction of gallbladder   G1 at [redacted]w[redacted]d Category 1 FHR  PLAN: Discharge home with AVS n biliary colic and cholelithiasis Meds ordered this encounter  Medications  . cyclobenzaprine (FLEXERIL) 5 MG tablet    Sig: Take 1 tablet (5 mg total) by mouth 3 (three) times daily as needed for muscle spasms.    Dispense:  30 tablet    Refill:  0    Order Specific Question:  Supervising Provider    Answer:  Willodean Rosenthal [id number]  . oxyCODONE-acetaminophen (PERCOCET/ROXICET) 5-325 MG per tablet    Sig: Take 1 tablet by mouth every 4 (four) hours as needed.    Dispense:  20 tablet    Refill:  0    Order Specific Question:  Supervising Provider    Answer:  Willodean Rosenthal [phone]  Percocet if Tylenol ineffective Follow-up Information   Follow up with [facility].   Contact information:   [address] [place] [address] [phone]      Follow up with [facility] On 03/23/2013.   Contact information:   [address] [place] [address] [phone]    Danae Orleans, CNM 03/15/2013 1:40 PM

## 2013-03-15 NOTE — MAU Note (Signed)
Bilateral rib pain when she sits up, has been hurting since Saturday, also mid-back pain.  Denies bleeding, uc's, or LOF.

## 2013-03-23 ENCOUNTER — Ambulatory Visit (INDEPENDENT_AMBULATORY_CARE_PROVIDER_SITE_OTHER): Payer: BC Managed Care – PPO | Admitting: Obstetrics & Gynecology

## 2013-03-23 VITALS — BP 120/76 | Wt 210.0 lb

## 2013-03-23 DIAGNOSIS — Z3400 Encounter for supervision of normal first pregnancy, unspecified trimester: Secondary | ICD-10-CM

## 2013-03-23 MED ORDER — PANTOPRAZOLE SODIUM 40 MG PO TBEC: 40.0000 mg | DELAYED_RELEASE_TABLET | Freq: Every day | ORAL | Status: DC

## 2013-03-23 NOTE — Progress Notes (Signed)
Rx Protonix 40 mg daily for reflux.

## 2013-03-23 NOTE — Patient Instructions (Signed)
Heartburn During Pregnancy   Heartburn is a burning sensation in the chest caused by stomach acid backing up into the esophagus. Heartburn is common in pregnancy because a certain hormone (progesterone) is released when a woman is pregnant. The progesterone hormone may relax the valve that separates the esophagus from the stomach. This allows acid to go up into the esophagus, causing heartburn. Heartburn may also happen in pregnancy because the enlarging uterus pushes up on the stomach, which pushes more acid into the esophagus. This is especially true in the later stages of pregnancy. Heartburn problems usually go away after giving birth.  CAUSES   Heartburn is caused by stomach acid backing up into the esophagus. During pregnancy, this may result from various things, including:   · The progesterone hormone.  · Changing hormone levels.  · The growing uterus pushing stomach acid upward.  · Large meals.  · Certain foods and drinks.  · Exercise.  · Increased acid production.  SIGNS AND SYMPTOMS   · Burning pain in the chest or lower throat.  · Bitter taste in the mouth.  · Coughing.  DIAGNOSIS   Your health care provider will typically diagnose heartburn by taking a careful history of your concern. Blood tests may be done to check for a certain type of bacteria that is associated with heartburn. Sometimes, heartburn is diagnosed by prescribing a heartburn medicine to see if the symptoms improve. In some cases, a procedure called an endoscopy may be done. In this procedure, a tube with a light and a camera on the end (endoscope) is used to examine the esophagus and the stomach.  TREATMENT   Treatment will vary depending on the severity of your symptoms. Your health care provider may recommend:  · Over-the-counter medicines (antacids, acid reducers) for mild heartburn.  · Prescription medicines to decrease stomach acid or to protect your stomach lining.  · Certain changes in your diet.  · Elevating the head of your bed  by putting blocks under the legs. This helps prevent stomach acid from backing up into the esophagus when you are lying down.  HOME CARE INSTRUCTIONS   · Only take over-the-counter or prescription medicines as directed by your health care provider.  · Raise the head of your bed by putting blocks under the legs if instructed to do so by your health care provider. Sleeping with more pillows is not effective because it only changes the position of your head.  · Do not exercise right after eating.  · Avoid eating 2 3 hours before bed. Do not lie down right after eating.  · Eat small meals throughout the day instead of three large meals.  · Identify foods and beverages that make your symptoms worse and avoid them. Foods you may want to avoid include:  · Peppers.  · Chocolate.  · High-fat foods, including fried foods.  · Spicy foods.  · Garlic and onions.  · Citrus fruits, including oranges, grapefruit, lemons, and limes.  · Food containing tomatoes or tomato products.  · Mint.  · Carbonated and caffeinated drinks.  · Vinegar.  SEEK MEDICAL CARE IF:  · You have abdominal pain of any kind.  · You feel burning in your upper abdomen or chest, especially after eating or lying down.  · You have nausea and vomiting.  · Your stomach feels upset after you eat.  SEEK IMMEDIATE MEDICAL CARE IF:   · You have severe chest pain that goes down your arm or into your   jaw or neck.  · You feel sweaty, dizzy, or lightheaded.  · You become short of breath.  · You vomit blood.  · You have difficulty or pain with swallowing.  · You have bloody or black, tarry stools.  · You have episodes of heartburn more than 3 times a week, for more than 2 weeks.  MAKE SURE YOU:  · Understand these instructions.  · Will watch your condition.  · Will get help right away if you are not doing well or get worse.  Document Released: 01/26/2000 Document Revised: 11/18/2012 Document Reviewed: 09/16/2012  ExitCare® Patient Information ©2014 [employer].

## 2013-03-23 NOTE — Progress Notes (Signed)
P - 97 

## 2013-03-30 ENCOUNTER — Encounter: Payer: BC Managed Care – PPO | Admitting: Obstetrics & Gynecology

## 2013-03-31 ENCOUNTER — Encounter: Payer: Self-pay | Admitting: Nurse Practitioner

## 2013-04-06 ENCOUNTER — Encounter: Payer: BC Managed Care – PPO | Admitting: Obstetrics & Gynecology

## 2013-04-06 ENCOUNTER — Telehealth (HOSPITAL_COMMUNITY): Payer: Self-pay | Admitting: Obstetrics & Gynecology

## 2013-04-12 ENCOUNTER — Encounter (HOSPITAL_COMMUNITY): Payer: Self-pay | Admitting: *Deleted

## 2013-04-12 ENCOUNTER — Inpatient Hospital Stay (HOSPITAL_COMMUNITY)
Admission: AD | Admit: 2013-04-12 | Discharge: 2013-04-14 | DRG: 775 | Disposition: A | Discharge status: Home / Self Care | Payer: BC Managed Care – PPO | Source: Ambulatory Visit | Attending: Obstetrics & Gynecology | Admitting: Obstetrics & Gynecology

## 2013-04-12 ENCOUNTER — Inpatient Hospital Stay (HOSPITAL_COMMUNITY): Payer: BC Managed Care – PPO | Admitting: Anesthesiology

## 2013-04-12 ENCOUNTER — Encounter (HOSPITAL_COMMUNITY): Payer: BC Managed Care – PPO | Admitting: Anesthesiology

## 2013-04-12 ENCOUNTER — Encounter: Payer: BC Managed Care – PPO | Admitting: Obstetrics & Gynecology

## 2013-04-12 DIAGNOSIS — Z87891 Personal history of nicotine dependence: Secondary | ICD-10-CM

## 2013-04-12 DIAGNOSIS — IMO0001 Reserved for inherently not codable concepts without codable children: Secondary | ICD-10-CM

## 2013-04-12 LAB — TYPE AND SCREEN
ABO/RH(D): O POS
Antibody Screen: NEGATIVE

## 2013-04-12 LAB — CBC
HEMATOCRIT: 36.8 % (ref 36.0–46.0)
HEMOGLOBIN: 12.4 g/dL (ref 12.0–15.0)
MCH: 27 pg (ref 26.0–34.0)
MCHC: 33.7 g/dL (ref 30.0–36.0)
MCV: 80.2 fL (ref 78.0–100.0)
Platelets: 176 10*3/uL (ref 150–400)
RBC: 4.59 MIL/uL (ref 3.87–5.11)
RDW: 13.5 % (ref 11.5–15.5)
WBC: 9.3 10*3/uL (ref 4.0–10.5)

## 2013-04-12 LAB — OB RESULTS CONSOLE GBS: GBS: NEGATIVE

## 2013-04-12 LAB — ABO/RH: ABO/RH(D): O POS

## 2013-04-12 LAB — GROUP B STREP BY PCR: Group B strep by PCR: NEGATIVE

## 2013-04-12 MED ORDER — LACTATED RINGERS IV SOLN
500.0000 mL | Freq: Once | INTRAVENOUS | Status: AC
  Administered 2013-04-12: 500 mL via INTRAVENOUS

## 2013-04-12 MED ORDER — ACETAMINOPHEN 325 MG PO TABS: 650.0000 mg | ORAL_TABLET | ORAL | Status: DC | PRN

## 2013-04-12 MED ORDER — OXYTOCIN BOLUS FROM INFUSION
500.0000 mL | INTRAVENOUS | Status: DC
  Administered 2013-04-13: 500 mL via INTRAVENOUS

## 2013-04-12 MED ORDER — PHENYLEPHRINE 40 MCG/ML (10ML) SYRINGE FOR IV PUSH (FOR BLOOD PRESSURE SUPPORT)
80.0000 ug | PREFILLED_SYRINGE | INTRAVENOUS | Status: DC | PRN
  Administered 2013-04-12: 80 ug via INTRAVENOUS
  Filled 2013-04-12: qty 2

## 2013-04-12 MED ORDER — OXYTOCIN 40 UNITS IN LACTATED RINGERS INFUSION - SIMPLE MED
1.0000 m[IU]/min | INTRAVENOUS | Status: DC
  Administered 2013-04-13: 2 m[IU]/min via INTRAVENOUS

## 2013-04-12 MED ORDER — PHENYLEPHRINE 40 MCG/ML (10ML) SYRINGE FOR IV PUSH (FOR BLOOD PRESSURE SUPPORT)
80.0000 ug | PREFILLED_SYRINGE | INTRAVENOUS | Status: DC | PRN
  Filled 2013-04-12: qty 10
  Filled 2013-04-12: qty 2

## 2013-04-12 MED ORDER — TERBUTALINE SULFATE 1 MG/ML IJ SOLN: 0.2500 mg | Freq: Once | INTRAMUSCULAR | Status: AC | PRN

## 2013-04-12 MED ORDER — DIPHENHYDRAMINE HCL 50 MG/ML IJ SOLN: 12.5000 mg | INTRAMUSCULAR | Status: DC | PRN

## 2013-04-12 MED ORDER — IBUPROFEN 600 MG PO TABS
600.0000 mg | ORAL_TABLET | Freq: Four times a day (QID) | ORAL | Status: DC | PRN
  Administered 2013-04-13: 600 mg via ORAL
  Filled 2013-04-12: qty 1

## 2013-04-12 MED ORDER — CITRIC ACID-SODIUM CITRATE 334-500 MG/5ML PO SOLN: 30.0000 mL | ORAL | Status: DC | PRN

## 2013-04-12 MED ORDER — OXYTOCIN 40 UNITS IN LACTATED RINGERS INFUSION - SIMPLE MED
62.5000 mL/h | INTRAVENOUS | Status: DC
  Filled 2013-04-12: qty 1000

## 2013-04-12 MED ORDER — LIDOCAINE HCL (PF) 1 % IJ SOLN
INTRAMUSCULAR | Status: DC | PRN
  Administered 2013-04-12 (×2): 9 mL

## 2013-04-12 MED ORDER — LIDOCAINE HCL (PF) 1 % IJ SOLN
30.0000 mL | INTRAMUSCULAR | Status: DC | PRN
  Filled 2013-04-12: qty 30

## 2013-04-12 MED ORDER — EPHEDRINE 5 MG/ML INJ
10.0000 mg | INTRAVENOUS | Status: DC | PRN
  Filled 2013-04-12: qty 4
  Filled 2013-04-12: qty 2

## 2013-04-12 MED ORDER — FENTANYL 2.5 MCG/ML BUPIVACAINE 1/10 % EPIDURAL INFUSION (WH - ANES)
INTRAMUSCULAR | Status: DC | PRN
  Administered 2013-04-12: 14 mL/h via EPIDURAL

## 2013-04-12 MED ORDER — FENTANYL 2.5 MCG/ML BUPIVACAINE 1/10 % EPIDURAL INFUSION (WH - ANES)
14.0000 mL/h | INTRAMUSCULAR | Status: DC | PRN
  Administered 2013-04-13: 14 mL/h via EPIDURAL
  Filled 2013-04-12 (×3): qty 125

## 2013-04-12 MED ORDER — OXYCODONE-ACETAMINOPHEN 5-325 MG PO TABS: 1.0000 | ORAL_TABLET | ORAL | Status: DC | PRN

## 2013-04-12 MED ORDER — LACTATED RINGERS IV SOLN
INTRAVENOUS | Status: DC
  Administered 2013-04-12 (×3): via INTRAVENOUS

## 2013-04-12 MED ORDER — ONDANSETRON HCL 4 MG/2ML IJ SOLN
4.0000 mg | Freq: Four times a day (QID) | INTRAMUSCULAR | Status: DC | PRN
  Administered 2013-04-13: 4 mg via INTRAVENOUS
  Filled 2013-04-12 (×2): qty 2

## 2013-04-12 MED ORDER — LACTATED RINGERS IV SOLN: 500.0000 mL | INTRAVENOUS | Status: DC | PRN

## 2013-04-12 MED ORDER — EPHEDRINE 5 MG/ML INJ
10.0000 mg | INTRAVENOUS | Status: DC | PRN
  Filled 2013-04-12: qty 2

## 2013-04-12 MED ORDER — BUTORPHANOL TARTRATE 1 MG/ML IJ SOLN
1.0000 mg | INTRAMUSCULAR | Status: DC | PRN
  Administered 2013-04-12: 1 mg via INTRAVENOUS
  Filled 2013-04-12: qty 1

## 2013-04-12 NOTE — Progress Notes (Signed)
Ferman HammingKayla Ziglar is a 18 y.o. G1P0 at 1849w6d admitted for active labor  Subjective: Comfortable. No pressure. +FM.   Objective: BP 119/69  Pulse 104  Temp(Src) 98.4 F (36.9 C) (Oral)  Resp 18  Ht 5\' 4"  (1.626 m)  Wt 93.441 kg (206 lb)  BMI 35.34 kg/m2  SpO2 100%  LMP 07/21/2012      FHT:  FHR: 140 bpm, variability: moderate,  accelerations:  Present,  decelerations:  Absent UC:   Every 4 min SVE:   Dilation: 8 Effacement (%): 80 Station: 0 Exam by:: dr. Erin Fullingharraway smith  Labs: Lab Results  Component Value Date   WBC 9.3 04/12/2013   HGB 12.4 04/12/2013   HCT 36.8 04/12/2013   MCV 80.2 04/12/2013   PLT 176 04/12/2013    Assessment / Plan: Protracted active phase  Labor: will start pitocin 2x2 now Fetal Wellbeing:  Category I Pain Control:  Epidural I/D:  n/a Anticipated MOD:  NSVD  Sadik Piascik L 04/12/2013, 11:56 PM

## 2013-04-12 NOTE — Progress Notes (Signed)
Sydney [name] is a 18 y.o. G1P0 at 4254w6d by ultrasound admitted for active labor  Subjective:  Pt s/p epidural.  She denies pain.  Objective: BP 115/71  Pulse 104  Temp(Src) 97.7 F (36.5 C) (Oral)  Resp 18  Ht 5\' 4"  (1.626 m)  Wt 206 lb (93.441 kg)  BMI 35.34 kg/m2  SpO2 100%  LMP 07/21/2012      FHT:  FHR: 140's bpm, variability: moderate,  accelerations:  Present,  decelerations:  Absent UC:   regular, every 2-3 minutes SVE:   Dilation: 8 Effacement (%): 90 Station: -1;-2 Exam by:: dr.harraway  smith  Labs: Lab Results  Component Value Date   WBC 9.3 04/12/2013   HGB 12.4 04/12/2013   HCT 36.8 04/12/2013   MCV 80.2 04/12/2013   PLT 176 04/12/2013    Assessment / Plan: Spontaneous labor, progressing normally  Labor: Progressing normally Fetal Wellbeing:  Category I Pain Control:  Epidural I/D:  n/a Anticipated MOD:  NSVD  HARRAWAY-SMITH, Deno Sida 04/12/2013, 9:51 PM

## 2013-04-12 NOTE — H&P (Signed)
Attestation of Attending Supervision of Advanced Practitioner (CNM/NP): Evaluation and management procedures were performed by the Advanced Practitioner under my supervision and collaboration.  I have reviewed the Advanced Practitioner's note and chart, and I agree with the management and plan.  HARRAWAY-SMITH, Dent Plantz 2:48 PM     

## 2013-04-12 NOTE — MAU Provider Note (Signed)
Attestation of Attending Supervision of Advanced Practitioner (CNM/NP): Evaluation and management procedures were performed by the Advanced Practitioner under my supervision and collaboration.  I have reviewed the Advanced Practitioner's note and chart, and I agree with the management and plan.  HARRAWAY-SMITH, Chaylee Ehrsam 2:48 PM

## 2013-04-12 NOTE — H&P (Signed)
Sydney [name] is a 18 y.o. female presenting for painful UCs about every 5 minutes since awakening today at 0630. Mucuy discharge. No LOF. Good FM.    Maternal Medical History:  Reason for admission: Contractions.  Nausea.  Contractions: Onset was 3-5 hours ago.   Frequency: regular.   Duration is approximately 50 seconds.   Perceived severity is moderate.    Fetal activity: Perceived fetal activity is normal.   Last perceived fetal movement was within the past hour.    Prenatal complications: no prenatal complications Bloody show  Prenatal Complications - Diabetes: none.    OB History   Grav Para Term Preterm Abortions TAB SAB Ect Mult Living   1              Past Medical History  Diagnosis Date  . Medical history non-contributory    Past Surgical History  Procedure Laterality Date  . No past surgeries     Family History: family history includes Diabetes in her paternal grandfather and paternal grandmother; Healthy in her brother, father, and mother; Hypertension in her maternal grandmother; Stroke in her maternal grandfather. Social History:  reports that she has quit smoking. Her smoking use included Cigarettes. She smoked 1.00 pack per day. She does not have any smokeless tobacco history on file. She reports that she does not drink alcohol or use illicit drugs.   Prenatal Transfer Tool  Maternal Diabetes: No Genetic Screening: Normal Maternal Ultrasounds/Referrals: Normal Fetal Ultrasounds or other Referrals:  None Maternal Substance Abuse:  No Significant Maternal Medications:  None Significant Maternal Lab Results:  Lab values include: Other: GBS by PCR pending Other Comments:  None  Review of Systems  Constitutional: Negative for fever.  Eyes: Negative for blurred vision.  Cardiovascular: Negative for chest pain.  Gastrointestinal: Positive for heartburn and abdominal pain. Negative for nausea, diarrhea and constipation.  Genitourinary: Negative for dysuria.   Neurological: Negative for headaches.  Psychiatric/Behavioral: Negative for depression.    Dilation: 4 Effacement (%): 80 Station: -2 Exam by:: D. Dylynn Ketner CNM Blood pressure 133/67, pulse 87, temperature 98 F (36.7 C), temperature source Oral, resp. rate 16, height 5' 3.5" (1.613 m), weight 93.804 kg (206 lb 12.8 oz), last menstrual period 07/21/2012, SpO2 99.00%. Cx midposition, bulging membranes, bloody show  Maternal Exam:  Uterine Assessment: Contraction strength is moderate.  Abdomen: Estimated fetal weight is 7#.    Introitus: Normal vulva. Normal vagina.  Vagina is negative for discharge.  Ferning test: not done.  Nitrazine test: not done. Amniotic fluid character: not assessed.  Pelvis: adequate for delivery.   Cervix: Cervix evaluated by digital exam.     Fetal Exam Fetal Monitor Review: Mode: ultrasound.   Baseline rate: 140-145.  Variability: moderate (6-25 bpm).   Pattern: accelerations present and no decelerations.    Fetal State Assessment: Category I - tracings are normal.     Physical Exam  Constitutional: She is oriented to person, place, and time. She appears well-developed and well-nourished. No distress.  HENT:  Head: Normocephalic.  Neck: Normal range of motion. No thyromegaly present.  Cardiovascular: Normal rate, regular rhythm and normal heart sounds.   Respiratory: Effort normal and breath sounds normal.  GI: Soft. There is no tenderness.  Genitourinary: Vagina normal. No vaginal discharge found.  Bloody show  Neurological: She is alert and oriented to person, place, and time. She has normal reflexes.  Skin: Skin is warm and dry.  Psychiatric: She has a normal mood and affect. Her behavior is normal.  Judgment and thought content normal.    Prenatal labs: ABO, Rh: O/Positive/-- (08/02 0000) Antibody: Negative (08/02 0000) Rubella: Immune (08/02 0000) RPR: NON REAC (12/23 1020)  HBsAg: Negative (08/02 0000)  HIV: NON REACTIVE (12/23 1020)   GBS:   pending 1 hr glucola 76  Assessment/Plan: Teen G1 at 419w6d in early labor> admit, expectant management  Category 1 FHR   Minh Roanhorse 04/12/2013, 2:31 PM

## 2013-04-12 NOTE — MAU Provider Note (Signed)
Chief Complaint:  Labor Eval   First Provider Initiated Contact with Patient 04/12/13 0957      HPI: Sydney [name] is a 18 y.o. G1P0 at [redacted]w[redacted]d who presents to maternity admissions reporting onset UCs this am 4 hr PTA about q83min. Denies leakage of fluid or vaginal bleeding. Good fetal movement.   Pregnancy Course: PNC at [facility] complicated by cholelithiasis. Had elevated LFTs at MAU visit @34  wks. Last PN visit 3 wks ago (missed appointments due to inclement weather).   Past Medical History: Past Medical History  Diagnosis Date  . Medical history non-contributory     Past obstetric history: OB History  Gravida Para Term Preterm AB SAB TAB Ectopic Multiple Living  1             # Outcome Date GA Lbr Len/2nd Weight Sex Delivery Anes PTL Lv  1 CUR               Past Surgical History: Past Surgical History  Procedure Laterality Date  . No past surgeries       Family History:  Noncontributory  Social History: History  Substance Use Topics  . Smoking status: Former Smoker -- 1.00 packs/day    Types: Cigarettes  . Smokeless tobacco: Not on file  . Alcohol Use: No    Allergies:  Allergies  Allergen Reactions  . Latex Rash    Meds:  Prescriptions prior to admission  Medication Sig Dispense Refill  . acetaminophen (TYLENOL) 500 MG tablet Take 500 mg by mouth every 6 (six) hours as needed for moderate pain.      Marland Kitchen oxyCODONE-acetaminophen (PERCOCET/ROXICET) 5-325 MG per tablet Take 1 tablet by mouth every 4 (four) hours as needed for moderate pain or severe pain.      . pantoprazole (PROTONIX) 40 MG tablet Take 1 tablet (40 mg total) by mouth daily.  30 tablet  1  . Prenatal Vit-Fe Fumarate-FA (MULTIVITAMIN-PRENATAL) 27-0.8 MG TABS tablet Take 1 tablet by mouth daily at 12 noon.  30 each  5  . cyclobenzaprine (FLEXERIL) 5 MG tablet Take 5 mg by mouth 3 (three) times daily as needed for muscle spasms.      . [DISCONTINUED] cyclobenzaprine (FLEXERIL) 5 MG tablet Take 1  tablet (5 mg total) by mouth 3 (three) times daily as needed for muscle spasms.  30 tablet  0    ROS: Pertinent findings in history of present illness.  Physical Exam  Blood pressure 133/67, pulse 87, temperature 98 F (36.7 C), temperature source Oral, resp. rate 16, height 5' 3.5" (1.613 m), weight 93.804 kg (206 lb 12.8 oz), last menstrual period 07/21/2012, SpO2 99.00%. GENERAL: Well-developed, well-nourished female in no acute distress.  HEENT: normocephalic HEART: normal rate RESP: normal effort ABDOMEN: Soft, non-tender, gravid appropriate for gestational age. UCs palpatae mild EXTREMITIES: Nontender, no edema NEURO: alert and oriented  Dilation: 3 Effacement (%): 70 Station: -2 Presentation: Vertex Exam by:: D. Poe CNM Cx posterior, soft, vtx -2/-3   FHT:  Baseline 120-125 , moderate variability, accelerations present, no decelerations Contractions: q 3-5 mins   Labs: No results found for this or any previous visit (from the past 24 hour(s)).  Imaging:  US Abdomen Limited  03/15/2013   CLINICAL DATA:  Upper abdominal pain.  Elevated function tests.  EXAM: US ABDOMEN LIMITED - RIGHT UPPER QUADRANT  COMPARISON:  None.  FINDINGS: Gallbladder:  Multiple mole wall gallstones. Gallbladder is only mildly distended. There is no wall thickening. There is no pericholecystic fluid.  Common bile duct:  Diameter: 2.5 mm.  Liver:  No focal lesion identified. Within normal limits in parenchymal echogenicity.  IMPRESSION: Cholelithiasis with no evidence of acute cholecystitis. No other abnormalities.   Electronically Signed   By: Amie Portlandavid  Ormond M.D.   On: 03/15/2013 12:23   MAU Course: Vaginal-rectal GBS done 1000: Will ambulate and recheck 0145: 4/80/-2/bulging, bloody show  Assessment: 1. Active labor at term   Early active labor G1@ 6462w6d Lapsed PNC [place] 1 FHR  Plan: Admit GBS by PCR sent [name], CNM 04/12/2013 10:02 AM

## 2013-04-12 NOTE — Progress Notes (Signed)
Patient ID: Sydney HammingKayla [name], female   DOB: 02/10/1996, 18 y.o.   MRN: [id number] Sydney HammingKayla [name] is a 18 y.o. G1P0 at 10987w6d admitted for early labor  Subjective: CTSP  -- requests pain med. Feels LBP with contractions.   Objective: BP 122/58  Pulse 104  Temp(Src) 98.2 F (36.8 C) (Oral)  Resp 20  Ht 5\' 4"  (1.626 m)  Wt 93.441 kg (206 lb)  BMI 35.34 kg/m2  SpO2 99%  LMP 07/21/2012  Fetal Heart FHR: 140 bpm, variability: moderate,  accelerations:  Present,  decelerations:  Absent   Contractions: irregular q 3-6  SVE:   Dilation: 4 Effacement (%): 80;90 Station: -2 Exam by:: D.  Kjersti Dittmer CNM  Assessment / Plan:  Labor: Early active> continue expectant management.  Fetal Wellbeing: Category 1 Pain Control:  WIll medicate with IV Stadol Expected mode of delivery: NSVD  Sydney [name] 04/12/2013, 4:46 PM

## 2013-04-12 NOTE — Anesthesia Procedure Notes (Signed)
Epidural Patient location during procedure: OB Start time: 04/12/2013 6:45 PM End time: 04/12/2013 6:49 PM  Staffing Anesthesiologist: Leilani AbleHATCHETT, Dehlia Kilner Performed by: anesthesiologist   Preanesthetic Checklist Completed: patient identified, surgical consent, pre-op evaluation, timeout performed, IV checked, risks and benefits discussed and monitors and equipment checked  Epidural Patient position: sitting Prep: site prepped and draped and DuraPrep Patient monitoring: continuous pulse ox and blood pressure Approach: midline Injection technique: LOR air  Needle:  Needle type: Tuohy  Needle gauge: 17 G Needle length: 9 cm and 9 Needle insertion depth: 6 cm Catheter type: closed end flexible Catheter size: 19 Gauge Catheter at skin depth: 11 cm Test dose: negative and Other  Assessment Sensory level: T9 Events: blood not aspirated, injection not painful, no injection resistance, negative IV test and no paresthesia  Additional Notes Reason for block:procedure for pain

## 2013-04-12 NOTE — MAU Note (Signed)
Patient states she has been having contractions every 5 minutes with mucus discharge. Denies bleeding or leaking and reports good fetal movement.

## 2013-04-12 NOTE — Anesthesia Preprocedure Evaluation (Signed)
Anesthesia Evaluation  Patient identified by MRN, date of birth, ID band Patient awake    Reviewed: Allergy & Precautions, H&P , NPO status , Patient's Chart, lab work & pertinent test results  Airway Mallampati: II TM Distance: >3 FB Neck ROM: full    Dental no notable dental hx.    Pulmonary neg pulmonary ROS, former smoker,    Pulmonary exam normal       Cardiovascular negative cardio ROS      Neuro/Psych negative neurological ROS  negative psych ROS   GI/Hepatic negative GI ROS, Neg liver ROS,   Endo/Other  negative endocrine ROS  Renal/GU negative Renal ROS     Musculoskeletal   Abdominal (+) + obese,   Peds  Hematology negative hematology ROS (+)   Anesthesia Other Findings   Reproductive/Obstetrics (+) Pregnancy                           Anesthesia Physical Anesthesia Plan  ASA: II  Anesthesia Plan: Epidural   Post-op Pain Management:    Induction:   Airway Management Planned:   Additional Equipment:   Intra-op Plan:   Post-operative Plan:   Informed Consent: I have reviewed the patients History and Physical, chart, labs and discussed the procedure including the risks, benefits and alternatives for the proposed anesthesia with the patient or authorized representative who has indicated his/her understanding and acceptance.     Plan Discussed with:   Anesthesia Plan Comments:         Anesthesia Quick Evaluation

## 2013-04-13 ENCOUNTER — Encounter (HOSPITAL_COMMUNITY): Payer: Self-pay

## 2013-04-13 DIAGNOSIS — Z87891 Personal history of nicotine dependence: Secondary | ICD-10-CM

## 2013-04-13 LAB — RPR: RPR Ser Ql: NONREACTIVE

## 2013-04-13 MED ORDER — WITCH HAZEL-GLYCERIN EX PADS: 1.0000 "application " | MEDICATED_PAD | CUTANEOUS | Status: DC | PRN

## 2013-04-13 MED ORDER — OXYCODONE-ACETAMINOPHEN 5-325 MG PO TABS: 1.0000 | ORAL_TABLET | ORAL | Status: DC | PRN

## 2013-04-13 MED ORDER — DIBUCAINE 1 % RE OINT: 1.0000 "application " | TOPICAL_OINTMENT | RECTAL | Status: DC | PRN

## 2013-04-13 MED ORDER — PRENATAL MULTIVITAMIN CH
1.0000 | ORAL_TABLET | Freq: Every day | ORAL | Status: DC
  Administered 2013-04-14: 1 via ORAL
  Filled 2013-04-13: qty 1

## 2013-04-13 MED ORDER — ZOLPIDEM TARTRATE 5 MG PO TABS: 5.0000 mg | ORAL_TABLET | Freq: Every evening | ORAL | Status: DC | PRN

## 2013-04-13 MED ORDER — IBUPROFEN 600 MG PO TABS
600.0000 mg | ORAL_TABLET | Freq: Four times a day (QID) | ORAL | Status: DC
  Administered 2013-04-13 – 2013-04-14 (×4): 600 mg via ORAL
  Filled 2013-04-13 (×4): qty 1

## 2013-04-13 MED ORDER — SENNOSIDES-DOCUSATE SODIUM 8.6-50 MG PO TABS
2.0000 | ORAL_TABLET | ORAL | Status: DC
  Administered 2013-04-14: 2 via ORAL
  Filled 2013-04-13: qty 2

## 2013-04-13 MED ORDER — ONDANSETRON HCL 4 MG/2ML IJ SOLN: 4.0000 mg | INTRAMUSCULAR | Status: DC | PRN

## 2013-04-13 MED ORDER — DIPHENHYDRAMINE HCL 25 MG PO CAPS: 25.0000 mg | ORAL_CAPSULE | Freq: Four times a day (QID) | ORAL | Status: DC | PRN

## 2013-04-13 MED ORDER — OXYTOCIN 40 UNITS IN LACTATED RINGERS INFUSION - SIMPLE MED
62.5000 mL/h | Freq: Once | INTRAVENOUS | Status: AC
  Administered 2013-04-13: 62.5 mL/h via INTRAVENOUS
  Filled 2013-04-13: qty 1000

## 2013-04-13 MED ORDER — LANOLIN HYDROUS EX OINT: TOPICAL_OINTMENT | CUTANEOUS | Status: DC | PRN

## 2013-04-13 MED ORDER — ONDANSETRON HCL 4 MG PO TABS: 4.0000 mg | ORAL_TABLET | ORAL | Status: DC | PRN

## 2013-04-13 MED ORDER — SIMETHICONE 80 MG PO CHEW: 80.0000 mg | CHEWABLE_TABLET | ORAL | Status: DC | PRN

## 2013-04-13 MED ORDER — TETANUS-DIPHTH-ACELL PERTUSSIS 5-2.5-18.5 LF-MCG/0.5 IM SUSP
0.5000 mL | Freq: Once | INTRAMUSCULAR | Status: AC
  Administered 2013-04-14: 0.5 mL via INTRAMUSCULAR
  Filled 2013-04-13: qty 0.5

## 2013-04-13 MED ORDER — BENZOCAINE-MENTHOL 20-0.5 % EX AERO
1.0000 "application " | INHALATION_SPRAY | CUTANEOUS | Status: DC | PRN
  Administered 2013-04-13: 1 via TOPICAL
  Filled 2013-04-13: qty 56

## 2013-04-13 NOTE — Anesthesia Postprocedure Evaluation (Signed)
  Anesthesia Post-op Note  Patient: Sydney [name]  Procedure(s) Performed: * No procedures listed *  Patient Location: Mother/Baby  Anesthesia Type:Epidural  Level of Consciousness: awake, alert , oriented and patient cooperative  Airway and Oxygen Therapy: Patient Spontanous Breathing  Post-op Pain: mild  Post-op Assessment: Patient's Cardiovascular Status Stable, Respiratory Function Stable, Patent Airway, No signs of Nausea or vomiting, Adequate PO intake and Pain level controlled  Post-op Vital Signs: Reviewed and stable  Complications: No apparent anesthesia complications

## 2013-04-13 NOTE — Progress Notes (Signed)
Sydney [name] is a 18 y.o. G1P0 at 7993w0d admitted for active labor  Subjective:  Pt sleeping comfortably. Not feeling pressure yet.  +FM.   Objective: BP 124/74  Pulse 113  Temp(Src) 97.8 F (36.6 C) (Oral)  Resp 18  Ht 5\' 4"  (1.626 m)  Wt 93.441 kg (206 lb)  BMI 35.34 kg/m2  SpO2 100%  LMP 07/21/2012      FHT:  FHR: 145 bpm, variability: moderate,  accelerations:  Present,  decelerations:  Present early and variables UC:   regular, every 2-3 minutes, MVU ~275 SVE:   Dilation: 8 Effacement (%): 80 Station: 0 Exam by:: a. white rn  Labs: Lab Results  Component Value Date   WBC 9.3 04/12/2013   HGB 12.4 04/12/2013   HCT 36.8 04/12/2013   MCV 80.2 04/12/2013   PLT 176 04/12/2013    Assessment / Plan: Protracted active phase  Labor: on pitocin. adequate MVUs since 0100. Not feeling pressure. Will wait to check until 5am-6am Fetal Wellbeing:  Category I Pain Control:  Epidural I/D:  n/a Anticipated MOD:  NSVD  [name] 04/13/2013, 4:09 AM

## 2013-04-13 NOTE — Progress Notes (Deleted)
Post Partum Day 1 Subjective: no complaints, up ad lib, voiding, tolerating PO and + flatus  Objective: Blood pressure 139/45, pulse 126, temperature 98.9 F (37.2 C), temperature source Oral, resp. rate 20, height 5\' 4"  (1.626 m), weight 93.441 kg (206 lb), last menstrual period 07/21/2012, SpO2 94.00%.  Physical Exam:  General: alert, cooperative and no distress Lochia: appropriate Uterine Fundus: firm Incision: NA DVT Evaluation: No evidence of DVT seen on physical exam. Negative Homan's sign. No significant calf/ankle edema.   Recent Labs  04/12/13 1431  HGB 12.4  HCT 36.8    Assessment/Plan: Plan for discharge tomorrow, Breastfeeding and Contraception Copper IUD   LOS: 1 day   Sydney [name], Sydney [name] 04/13/2013, 7:00 AM

## 2013-04-14 LAB — CBC
HEMATOCRIT: 32.4 % — AB (ref 36.0–46.0)
HEMOGLOBIN: 10.9 g/dL — AB (ref 12.0–15.0)
MCH: 27 pg (ref 26.0–34.0)
MCHC: 33.6 g/dL (ref 30.0–36.0)
MCV: 80.2 fL (ref 78.0–100.0)
Platelets: 166 10*3/uL (ref 150–400)
RBC: 4.04 MIL/uL (ref 3.87–5.11)
RDW: 13.8 % (ref 11.5–15.5)
WBC: 13.6 10*3/uL — ABNORMAL HIGH (ref 4.0–10.5)

## 2013-04-14 LAB — CULTURE, BETA STREP (GROUP B ONLY)

## 2013-04-14 MED ORDER — IBUPROFEN 600 MG PO TABS: 600.0000 mg | ORAL_TABLET | Freq: Four times a day (QID) | ORAL | Status: DC

## 2013-04-14 NOTE — Discharge Instructions (Signed)

## 2013-04-14 NOTE — Discharge Summary (Signed)
Obstetric Discharge Summary Reason for Admission: onset of labor Prenatal Procedures: ultrasound Intrapartum Procedures: spontaneous vaginal delivery Postpartum Procedures: none Complications-Operative and Postpartum: none Hemoglobin  Date Value Ref Range Status  04/14/2013 10.9* 12.0 - 15.0 g/dL Final     HCT  Date Value Ref Range Status  04/14/2013 32.4* 36.0 - 46.0 % Final    Discharge Diagnoses: Term Pregnancy-delivered  [name] Course:  Sydney [name] is a 18 y.o. G1P1001 who presented with onset of labor.  She had a uncomplicated SVD. She was able to ambulate, tolerate PO and void normally. She was discharged home with instructions for postpartum care.  [name]  Delivery Note At 7:40 AM a healthy female was delivered via Vaginal, Spontaneous Delivery (Presentation: Left Occiput Anterior).  APGAR: 8, 9; weight .   Placenta status: Intact, Spontaneous.  Cord: 3 vessels with the following complications: None.  Cord pH: obtained and pending  Anesthesia: Epidural  Episiotomy: None Lacerations: none Suture Repair: na Est. Blood Loss (350mL):   Mom to postpartum.  Baby to Couplet care / Skin to Skin.  Sydney [name], Sydney [name] 04/13/2013, 7:52 AM  I have seen and examined this patient and agree with above documentation in the resident's note. Pt pushed with good maternal effort to deliver a liveborn female over intact perineum. Compound presentation of a hand noted and reduced on the perineum. Baby delivered without difficulty. Baby to maternal abdomen. Delayed cord clamping performed and cut by FOB. Placenta delivered intact with 3V cord. No tears or complications. Mom to postpartum and baby to skin to skin.   Sydney [name], M.D. Sydney [name] 04/13/2013 8:37 AM  Physical Exam:  General: alert, cooperative and no distress Lochia: appropriate Uterine Fundus: firm DVT Evaluation: No evidence of DVT seen on physical exam.  Discharge Information: Date:  04/14/2013 Activity: pelvic rest Diet: routine Medications: Ibuprofen Baby feeding: plans to bottle feed Contraception: nexplanon Condition: stable Instructions: refer to practice specific booklet Discharge to: home   Newborn Data: Live born female  Birth Weight: 6 lb 14.4 oz (3130 g) APGAR: 8, 9  Home with mother.  Sydney ScaleMichael Ryan Montie Gelardi, MD OB Fellow

## 2013-04-24 DIAGNOSIS — Z791 Long term (current) use of non-steroidal anti-inflammatories (NSAID): Secondary | ICD-10-CM | POA: Insufficient documentation

## 2013-04-24 DIAGNOSIS — K8020 Calculus of gallbladder without cholecystitis without obstruction: Secondary | ICD-10-CM | POA: Insufficient documentation

## 2013-04-24 DIAGNOSIS — Z79899 Other long term (current) drug therapy: Secondary | ICD-10-CM | POA: Insufficient documentation

## 2013-04-24 DIAGNOSIS — Z87891 Personal history of nicotine dependence: Secondary | ICD-10-CM | POA: Insufficient documentation

## 2013-04-24 DIAGNOSIS — Z91040 Latex allergy status: Secondary | ICD-10-CM | POA: Insufficient documentation

## 2013-04-24 NOTE — ED Notes (Addendum)
Pt. reports progressing RUQ pain for several weeks , denies nausea or vomitting , diarrhea yesterday . Pt. stated history of gallstones / [facility]

## 2013-04-25 ENCOUNTER — Emergency Department (HOSPITAL_COMMUNITY)
Admission: EM | Admit: 2013-04-25 | Discharge: 2013-04-25 | Disposition: A | Discharge status: Home / Self Care | Payer: BC Managed Care – PPO | Attending: Emergency Medicine | Admitting: Emergency Medicine

## 2013-04-25 ENCOUNTER — Encounter (HOSPITAL_COMMUNITY): Payer: Self-pay | Admitting: Emergency Medicine

## 2013-04-25 DIAGNOSIS — K8020 Calculus of gallbladder without cholecystitis without obstruction: Secondary | ICD-10-CM

## 2013-04-25 HISTORY — DX: Calculus of gallbladder without cholecystitis without obstruction: K80.20

## 2013-04-25 LAB — URINALYSIS, ROUTINE W REFLEX MICROSCOPIC
Bilirubin Urine: NEGATIVE
GLUCOSE, UA: NEGATIVE mg/dL
KETONES UR: NEGATIVE mg/dL
Nitrite: NEGATIVE
PH: 5.5 (ref 5.0–8.0)
Protein, ur: NEGATIVE mg/dL
Specific Gravity, Urine: 1.025 (ref 1.005–1.030)
Urobilinogen, UA: 0.2 mg/dL (ref 0.0–1.0)

## 2013-04-25 LAB — COMPREHENSIVE METABOLIC PANEL
ALK PHOS: 183 U/L — AB (ref 39–117)
ALT: 37 U/L — ABNORMAL HIGH (ref 0–35)
AST: 78 U/L — AB (ref 0–37)
Albumin: 3.4 g/dL — ABNORMAL LOW (ref 3.5–5.2)
BILIRUBIN TOTAL: 0.3 mg/dL (ref 0.3–1.2)
BUN: 12 mg/dL (ref 6–23)
CHLORIDE: 101 meq/L (ref 96–112)
CO2: 26 meq/L (ref 19–32)
CREATININE: 0.83 mg/dL (ref 0.50–1.10)
Calcium: 9.8 mg/dL (ref 8.4–10.5)
GFR calc Af Amer: >90 mL/min (ref >90)
GFR calc non Af Amer: >90 mL/min (ref >90)
Glucose, Bld: 78 mg/dL (ref 70–99)
POTASSIUM: 5 meq/L (ref 3.7–5.3)
Sodium: 141 mEq/L (ref 137–147)
Total Protein: 7.8 g/dL (ref 6.0–8.3)

## 2013-04-25 LAB — CBC WITH DIFFERENTIAL/PLATELET
BASOS ABS: 0 10*3/uL (ref 0.0–0.1)
BASOS PCT: 1 % (ref 0–1)
EOS PCT: 2 % (ref 0–5)
Eosinophils Absolute: 0.1 10*3/uL (ref 0.0–0.7)
HCT: 44.6 % (ref 36.0–46.0)
Hemoglobin: 14.4 g/dL (ref 12.0–15.0)
LYMPHS ABS: 2.6 10*3/uL (ref 0.7–4.0)
Lymphocytes Relative: 36 % (ref 12–46)
MCH: 26.6 pg (ref 26.0–34.0)
MCHC: 32.3 g/dL (ref 30.0–36.0)
MCV: 82.4 fL (ref 78.0–100.0)
Monocytes Absolute: 0.6 10*3/uL (ref 0.1–1.0)
Monocytes Relative: 8 % (ref 3–12)
Neutro Abs: 4 10*3/uL (ref 1.7–7.7)
Neutrophils Relative %: 54 % (ref 43–77)
Platelets: 314 10*3/uL (ref 150–400)
RBC: 5.41 MIL/uL — AB (ref 3.87–5.11)
RDW: 13.5 % (ref 11.5–15.5)
WBC: 7.3 10*3/uL (ref 4.0–10.5)

## 2013-04-25 LAB — URINE MICROSCOPIC-ADD ON

## 2013-04-25 LAB — LIPASE, BLOOD: Lipase: 22 U/L (ref 11–59)

## 2013-04-25 MED ORDER — PROMETHAZINE HCL 25 MG PO TABS: 25.0000 mg | ORAL_TABLET | Freq: Four times a day (QID) | ORAL | Status: DC | PRN

## 2013-04-25 MED ORDER — ONDANSETRON 8 MG PO TBDP: 8.0000 mg | ORAL_TABLET | Freq: Three times a day (TID) | ORAL | Status: DC | PRN

## 2013-04-25 NOTE — ED Provider Notes (Addendum)
CSN: [id number]     Arrival date & time 04/24/13  2351 History   First MD Initiated Contact with Patient 04/25/13 0034     Chief Complaint  Patient presents with  . Abdominal Pain     (Consider location/radiation/quality/duration/timing/severity/associated sxs/prior Treatment) HPI Comments: Pt with hx of multiple gall stones comes in with cc of abd pain. Abd pain is located in the RUQ, and is intermittent, but fairly regular, and is provoked by food. Pt has mild pain currently, but pain was severe prior to the ER arrival. There is no n/v/f/c. Pt wasn't provided with any surgery f/u when gall stones were discovered, as she was pregnant at that time, but really feels that she needs resolution of her symptoms.  Patient is a 18 y.o. female presenting with abdominal pain. The history is provided by the patient.  Abdominal Pain Associated symptoms: no chest pain, no dysuria, no nausea, no shortness of breath and no vomiting     Past Medical History  Diagnosis Date  . Medical history non-contributory   . Gallstones    Past Surgical History  Procedure Laterality Date  . No past surgeries     Family History  Problem Relation Age of Onset  . Healthy Mother   . Healthy Father   . Healthy Brother   . Hypertension Maternal Grandmother   . Stroke Maternal Grandfather   . Diabetes Paternal Grandmother   . Diabetes Paternal Grandfather    History  Substance Use Topics  . Smoking status: Former Smoker -- 1.00 packs/day    Types: Cigarettes  . Smokeless tobacco: Not on file  . Alcohol Use: No   OB History   Grav Para Term Preterm Abortions TAB SAB Ect Mult Living   1 1 1       1      Review of Systems  Constitutional: Negative for activity change.  Respiratory: Negative for shortness of breath.   Cardiovascular: Negative for chest pain.  Gastrointestinal: Positive for abdominal pain. Negative for nausea and vomiting.  Genitourinary: Negative for dysuria.  Musculoskeletal: Negative  for neck pain.  Neurological: Negative for headaches.  All other systems reviewed and are negative.      Allergies  Latex  Home Medications   Current Outpatient Rx  Name  Route  Sig  Dispense  Refill  . acetaminophen (TYLENOL) 500 MG tablet   Oral   Take 500 mg by mouth every 6 (six) hours as needed for moderate pain.         Marland Kitchen ibuprofen (ADVIL,MOTRIN) 600 MG tablet   Oral   Take 1 tablet (600 mg total) by mouth every 6 (six) hours.   30 tablet   0   . oxyCODONE-acetaminophen (PERCOCET/ROXICET) 5-325 MG per tablet   Oral   Take 1 tablet by mouth every 4 (four) hours as needed for moderate pain or severe pain.         . pantoprazole (PROTONIX) 40 MG tablet   Oral   Take 1 tablet (40 mg total) by mouth daily.   30 tablet   1   . Prenatal Vit-Fe Fumarate-FA (MULTIVITAMIN-PRENATAL) 27-0.8 MG TABS tablet   Oral   Take 1 tablet by mouth daily at 12 noon.   30 each   5   . ondansetron (ZOFRAN ODT) 8 MG disintegrating tablet   Oral   Take 1 tablet (8 mg total) by mouth every 8 (eight) hours as needed for nausea.   20 tablet   0   .  promethazine (PHENERGAN) 25 MG tablet   Oral   Take 1 tablet (25 mg total) by mouth every 6 (six) hours as needed for nausea.   30 tablet   0    BP 128/78  Pulse 76  Temp(Src) 97.5 F (36.4 C) (Oral)  Resp 18  Ht 5\' 4"  (1.626 m)  Wt 186 lb (84.369 kg)  BMI 31.91 kg/m2  SpO2 98%  LMP 07/21/2012 Physical Exam  Nursing note and vitals reviewed. Constitutional: She is oriented to person, place, and time. She appears well-developed and well-nourished.  HENT:  Head: Normocephalic and atraumatic.  Eyes: EOM are normal. Pupils are equal, round, and reactive to light.  Neck: Neck supple.  Cardiovascular: Normal rate, regular rhythm and normal heart sounds.   No murmur heard. Pulmonary/Chest: Effort normal. No respiratory distress.  Abdominal: Soft. She exhibits no distension. There is tenderness. There is no rebound and no  guarding.  ruq tenderness, no peritoneal signs  Neurological: She is alert and oriented to person, place, and time.  Skin: Skin is warm and dry.    ED Course  Procedures (including critical care time) Labs Review Labs Reviewed  CBC WITH DIFFERENTIAL - Abnormal; Notable for the following:    RBC 5.41 (*)    All other components within normal limits  COMPREHENSIVE METABOLIC PANEL - Abnormal; Notable for the following:    Albumin 3.4 (*)    AST 78 (*)    ALT 37 (*)    Alkaline Phosphatase 183 (*)    All other components within normal limits  URINALYSIS, ROUTINE W REFLEX MICROSCOPIC - Abnormal; Notable for the following:    APPearance CLOUDY (*)    Hgb urine dipstick LARGE (*)    Leukocytes, UA LARGE (*)    All other components within normal limits  URINE MICROSCOPIC-ADD ON - Abnormal; Notable for the following:    Bacteria, UA FEW (*)    All other components within normal limits  URINE CULTURE  LIPASE, BLOOD   Imaging Review No results found.   EKG Interpretation None      MDM   Final diagnoses:  Cholelithiasis    Pt comes in wit RUQ pain, and has mild tenderness on exam, no peritoneal signs, normal vitals. She does have slightly elevated LFTS. She has known cholelithiasis - will give Gen Surg f/u. Informed her to return to the ER if her symptoms get worse, or are persistent.  Derwood KaplanAnkit Dorri Ozturk, MD 04/25/13 0218  2:18 AM +UA - but asymptomatic. WE will get urine cultures, just in case, but doesn't appear to be pyelo right now clinically.  Derwood KaplanAnkit Burlin Mcnair, MD 04/25/13 [phone]

## 2013-04-25 NOTE — ED Notes (Signed)
Pt discharged.Vital signs stable and GCS 15 

## 2013-04-25 NOTE — Discharge Instructions (Signed)
Cholelithiasis °Cholelithiasis (also called gallstones) is a form of gallbladder disease in which gallstones form in your gallbladder. The gallbladder is an organ that stores bile made in the liver, which helps digest fats. Gallstones begin as small crystals and slowly grow into stones. Gallstone pain occurs when the gallbladder spasms and a gallstone is blocking the duct. Pain can also occur when a stone passes out of the duct.  °RISK FACTORS °· Being female.   °· Having multiple pregnancies. Health care providers sometimes advise removing diseased gallbladders before future pregnancies.   °· Being obese. °· Eating a diet heavy in fried foods and fat.   °· Being older than 60 years and increasing age.   °· Prolonged use of medicines containing female hormones.   °· Having diabetes mellitus.   °· Rapidly losing weight.   °· Having a family history of gallstones (heredity).   °SYMPTOMS °· Nausea.   °· Vomiting. °· Abdominal pain.   °· Yellowing of the skin (jaundice).   °· Sudden pain. It may persist from several minutes to several hours. °· Fever.   °· Tenderness to the touch.  °In some cases, when gallstones do not move into the bile duct, people have no pain or symptoms. These are called "silent" gallstones.  °TREATMENT °Silent gallstones do not need treatment. In severe cases, emergency surgery may be required. Options for treatment include: °· Surgery to remove the gallbladder. This is the most common treatment. °· Medicines. These do not always work and may take 6 12 months or more to work. °· Shock wave treatment (extracorporeal biliary lithotripsy). In this treatment an ultrasound machine sends shock waves to the gallbladder to break gallstones into smaller pieces that can pass into the intestines or be dissolved by medicine. °HOME CARE INSTRUCTIONS  °· Only take over-the-counter or prescription medicines for pain, discomfort, or fever as directed by your health care provider.   °· Follow a low-fat diet until  seen again by your health care provider. Fat causes the gallbladder to contract, which can result in pain.   °· Follow up with your health care provider as directed. Attacks are almost always recurrent and surgery is usually required for permanent treatment.   °SEEK IMMEDIATE MEDICAL CARE IF:  °· Your pain increases and is not controlled by medicines.   °· You have a fever or persistent symptoms for more than 2 3 days.   °· You have a fever and your symptoms suddenly get worse.   °· You have persistent nausea and vomiting.   °MAKE SURE YOU:  °· Understand these instructions. °· Will watch your condition. °· Will get help right away if you are not doing well or get worse. °Document Released: 01/24/2005 Document Revised: 09/30/2012 Document Reviewed: 07/22/2012 °ExitCare® Patient Information ©2014 [employer]. ° °

## 2013-04-26 LAB — URINE CULTURE

## 2013-05-04 ENCOUNTER — Encounter: Payer: BC Managed Care – PPO | Admitting: Obstetrics & Gynecology

## 2013-05-04 ENCOUNTER — Ambulatory Visit (INDEPENDENT_AMBULATORY_CARE_PROVIDER_SITE_OTHER): Payer: BC Managed Care – PPO | Admitting: Obstetrics & Gynecology

## 2013-05-04 ENCOUNTER — Encounter: Payer: Self-pay | Admitting: Obstetrics & Gynecology

## 2013-05-04 VITALS — BP 123/76 | HR 83 | Resp 16 | Ht 64.0 in | Wt 185.0 lb

## 2013-05-04 DIAGNOSIS — Z01812 Encounter for preprocedural laboratory examination: Secondary | ICD-10-CM

## 2013-05-04 DIAGNOSIS — IMO0001 Reserved for inherently not codable concepts without codable children: Secondary | ICD-10-CM

## 2013-05-04 DIAGNOSIS — Z30017 Encounter for initial prescription of implantable subdermal contraceptive: Secondary | ICD-10-CM

## 2013-05-04 LAB — POCT URINE PREGNANCY: Preg Test, Ur: NEGATIVE

## 2013-05-04 MED ORDER — ETONOGESTREL 68 MG ~~LOC~~ IMPL
68.0000 mg | DRUG_IMPLANT | Freq: Once | SUBCUTANEOUS | Status: AC
  Administered 2013-05-04: 68 mg via SUBCUTANEOUS

## 2013-05-04 NOTE — Progress Notes (Signed)
Patient ID: Ferman HammingKayla Ziglar, female   DOB: [birth year], 18 y.o.   MRN: [id number] G1P1001 No LMP recorded. 3weeks postpartum from SVD, abstaining currently, requests nexplanon. The procedure and risk of complication and side effects discussed.  Patient given informed consent, signed copy in the chart, time out was performed. Pregnancy test was negative. Appropriate time out taken.  Patient's left arm was prepped and draped in the usual sterile fashion.. The ruler used to measure and mark insertion area.  Pt was prepped with alcohol swab and then injected with 5 cc of 1% lidocaine .  Pt was prepped with betadine, Nexplanon removed form packaging,  Device confirmed in needle, then inserted full length of needle and withdrawn per handbook instructions.  Pt insertion site covered with gauze.   Minimal blood loss.  Pt tolerated the procedure well.   Adam PhenixJames G Dimond Crotty, MD 05/04/2013

## 2013-05-04 NOTE — Patient Instructions (Signed)
Etonogestrel implant What is this medicine? ETONOGESTREL (et oh noe JES trel) is a contraceptive (birth control) device. It is used to prevent pregnancy. It can be used for up to 3 years. This medicine may be used for other purposes; ask your health care provider or pharmacist if you have questions. COMMON BRAND NAME(S): Implanon, Nexplanon  What should I tell my health care provider before I take this medicine? They need to know if you have any of these conditions: -abnormal vaginal bleeding -blood vessel disease or blood clots -cancer of the breast, cervix, or liver -depression -diabetes -gallbladder disease -headaches -heart disease or recent heart attack -high blood pressure -high cholesterol -kidney disease -liver disease -renal disease -seizures -tobacco smoker -an unusual or allergic reaction to etonogestrel, other hormones, anesthetics or antiseptics, medicines, foods, dyes, or preservatives -pregnant or trying to get pregnant -breast-feeding How should I use this medicine? This device is inserted just under the skin on the inner side of your upper arm by a health care professional. Talk to your pediatrician regarding the use of this medicine in children. Special care may be needed. Overdosage: If you think you've taken too much of this medicine contact a poison control center or emergency room at once. Overdosage: If you think you have taken too much of this medicine contact a poison control center or emergency room at once. NOTE: This medicine is only for you. Do not share this medicine with others. What if I miss a dose? This does not apply. What may interact with this medicine? Do not take this medicine with any of the following medications: -amprenavir -bosentan -fosamprenavir This medicine may also interact with the following medications: -barbiturate medicines for inducing sleep or treating seizures -certain medicines for fungal infections like ketoconazole and  itraconazole -griseofulvin -medicines to treat seizures like carbamazepine, felbamate, oxcarbazepine, phenytoin, topiramate -modafinil -phenylbutazone -rifampin -some medicines to treat HIV infection like atazanavir, indinavir, lopinavir, nelfinavir, tipranavir, ritonavir -St. John's wort This list may not describe all possible interactions. Give your health care provider a list of all the medicines, herbs, non-prescription drugs, or dietary supplements you use. Also tell them if you smoke, drink alcohol, or use illegal drugs. Some items may interact with your medicine. What should I watch for while using this medicine? This product does not protect you against HIV infection (AIDS) or other sexually transmitted diseases. You should be able to feel the implant by pressing your fingertips over the skin where it was inserted. Tell your doctor if you cannot feel the implant. What side effects may I notice from receiving this medicine? Side effects that you should report to your doctor or health care professional as soon as possible: -allergic reactions like skin rash, itching or hives, swelling of the face, lips, or tongue -breast lumps -changes in vision -confusion, trouble speaking or understanding -dark urine -depressed mood -general ill feeling or flu-like symptoms -light-colored stools -loss of appetite, nausea -right upper belly pain -severe headaches -severe pain, swelling, or tenderness in the abdomen -shortness of breath, chest pain, swelling in a leg -signs of pregnancy -sudden numbness or weakness of the face, arm or leg -trouble walking, dizziness, loss of balance or coordination -unusual vaginal bleeding, discharge -unusually weak or tired -yellowing of the eyes or skin Side effects that usually do not require medical attention (Report these to your doctor or health care professional if they continue or are bothersome.): -acne -breast pain -changes in  weight -cough -fever or chills -headache -irregular menstrual bleeding -itching, burning,   and vaginal discharge -pain or difficulty passing urine -sore throat This list may not describe all possible side effects. Call your doctor for medical advice about side effects. You may report side effects to FDA at 1-800-FDA-1088. Where should I keep my medicine? This drug is given in a hospital or clinic and will not be stored at home. NOTE: This sheet is a summary. It may not cover all possible information. If you have questions about this medicine, talk to your doctor, pharmacist, or health care provider.  2014, Elsevier/Gold Standard. (2011-08-05 15:37:45)  

## 2013-05-05 ENCOUNTER — Encounter: Payer: Self-pay | Admitting: *Deleted

## 2013-05-06 ENCOUNTER — Ambulatory Visit (INDEPENDENT_AMBULATORY_CARE_PROVIDER_SITE_OTHER): Payer: BC Managed Care – PPO | Admitting: General Surgery

## 2013-05-06 ENCOUNTER — Encounter (INDEPENDENT_AMBULATORY_CARE_PROVIDER_SITE_OTHER): Payer: Self-pay | Admitting: General Surgery

## 2013-05-06 VITALS — BP 124/78 | HR 75 | Temp 97.8°F | Resp 16 | Ht 64.0 in | Wt 186.0 lb

## 2013-05-06 DIAGNOSIS — K8020 Calculus of gallbladder without cholecystitis without obstruction: Secondary | ICD-10-CM

## 2013-05-06 NOTE — Progress Notes (Signed)
Patient ID: Sydney [name], female   DOB: 1995-12-21, 18 y.o.   MRN: [id number]  Chief Complaint  Patient presents with  . Abdominal Pain    HPI Sydney [name] is a 18 y.o. female.  The patient is a 18 year old female with a proximally nine-month history of right upper quadrant pain. The patient recently had a baby and the pain began during this time. The patient underwent ultrasound which revealed gallstones. She continued having her quadrant pain more so with fatty, spicy meals. Patient does have associated nausea and vomiting.  HPI  Past Medical History  Diagnosis Date  . Medical history non-contributory   . Gallstones     Past Surgical History  Procedure Laterality Date  . No past surgeries      Family History  Problem Relation Age of Onset  . Healthy Mother   . Healthy Father   . Healthy Brother   . Hypertension Maternal Grandmother   . Stroke Maternal Grandfather   . Diabetes Paternal Grandmother   . Diabetes Paternal Grandfather     Social History History  Substance Use Topics  . Smoking status: Former Smoker -- 1.00 packs/day    Types: Cigarettes  . Smokeless tobacco: Not on file  . Alcohol Use: No    Allergies  Allergen Reactions  . Latex Rash    Current Outpatient Prescriptions  Medication Sig Dispense Refill  . acetaminophen (TYLENOL) 500 MG tablet Take 500 mg by mouth every 6 (six) hours as needed for moderate pain.      Marland Kitchen. ibuprofen (ADVIL,MOTRIN) 600 MG tablet Take 1 tablet (600 mg total) by mouth every 6 (six) hours.  30 tablet  0  . ondansetron (ZOFRAN ODT) 8 MG disintegrating tablet Take 1 tablet (8 mg total) by mouth every 8 (eight) hours as needed for nausea.  20 tablet  0  . oxyCODONE-acetaminophen (PERCOCET/ROXICET) 5-325 MG per tablet Take 1 tablet by mouth every 4 (four) hours as needed for moderate pain or severe pain.      . pantoprazole (PROTONIX) 40 MG tablet Take 1 tablet (40 mg total) by mouth daily.  30 tablet  1   No current  facility-administered medications for this visit.    Review of Systems Review of Systems  Constitutional: Negative.   HENT: Negative.   Respiratory: Negative.   Cardiovascular: Negative.   Gastrointestinal: Negative.   Neurological: Negative.   All other systems reviewed and are negative.    Blood pressure 124/78, pulse 75, temperature 97.8 F (36.6 C), temperature source Oral, resp. rate 16, height 5\' 4"  (1.626 m), weight 186 lb (84.369 kg), not currently breastfeeding.  Physical Exam Physical Exam  Constitutional: She is oriented to person, place, and time. She appears well-developed and well-nourished.  HENT:  Head: Normocephalic and atraumatic.  Eyes: Conjunctivae and EOM are normal. Pupils are equal, round, and reactive to light.  Neck: Normal range of motion. Neck supple.  Cardiovascular: Normal rate, regular rhythm and normal heart sounds.   Pulmonary/Chest: Effort normal and breath sounds normal.  Abdominal: Soft. Bowel sounds are normal. She exhibits no distension and no mass. There is no tenderness. There is no rebound and no guarding.  Musculoskeletal: Normal range of motion.  Neurological: She is alert and oriented to person, place, and time.  Skin: Skin is warm and dry.  Psychiatric: She has a normal mood and affect.    Data Reviewed Ultrasound reveals multiple gallstones LFTs within normal limits  Assessment    18 year old female with symptomatic cholelithiasis  Plan    1. We'll proceed to the operating room for laparoscopic cholecystectomy 2. All risks and benefits were discussed with the patient to generally include: infection, bleeding, possible need for post op ERCP, damage to the bile ducts, and bile leak. Alternatives were offered and described.  All questions were answered and the patient voiced understanding of the procedure and wishes to proceed at this point with a laparoscopic cholecystectomy         Marigene Ehlers., Jed Limerick 05/06/2013,  10:39 AM

## 2013-05-19 ENCOUNTER — Encounter (HOSPITAL_COMMUNITY): Payer: Self-pay | Admitting: Emergency Medicine

## 2013-05-19 DIAGNOSIS — O9089 Other complications of the puerperium, not elsewhere classified: Principal | ICD-10-CM | POA: Diagnosis present

## 2013-05-19 DIAGNOSIS — K859 Acute pancreatitis without necrosis or infection, unspecified: Secondary | ICD-10-CM | POA: Diagnosis present

## 2013-05-19 DIAGNOSIS — Z87891 Personal history of nicotine dependence: Secondary | ICD-10-CM

## 2013-05-19 DIAGNOSIS — K8066 Calculus of gallbladder and bile duct with acute and chronic cholecystitis without obstruction: Secondary | ICD-10-CM | POA: Diagnosis present

## 2013-05-19 DIAGNOSIS — R109 Unspecified abdominal pain: Secondary | ICD-10-CM | POA: Diagnosis not present

## 2013-05-19 LAB — CBC WITH DIFFERENTIAL/PLATELET
BASOS ABS: 0 10*3/uL (ref 0.0–0.1)
Basophils Relative: 0 % (ref 0–1)
EOS ABS: 0.1 10*3/uL (ref 0.0–0.7)
Eosinophils Relative: 1 % (ref 0–5)
HCT: 41.7 % (ref 36.0–46.0)
Hemoglobin: 13.9 g/dL (ref 12.0–15.0)
Lymphocytes Relative: 20 % (ref 12–46)
Lymphs Abs: 1.4 10*3/uL (ref 0.7–4.0)
MCH: 27.1 pg (ref 26.0–34.0)
MCHC: 33.3 g/dL (ref 30.0–36.0)
MCV: 81.3 fL (ref 78.0–100.0)
Monocytes Absolute: 0.5 10*3/uL (ref 0.1–1.0)
Monocytes Relative: 7 % (ref 3–12)
NEUTROS PCT: 72 % (ref 43–77)
Neutro Abs: 4.9 10*3/uL (ref 1.7–7.7)
PLATELETS: 238 10*3/uL (ref 150–400)
RBC: 5.13 MIL/uL — ABNORMAL HIGH (ref 3.87–5.11)
RDW: 13.9 % (ref 11.5–15.5)
WBC: 6.8 10*3/uL (ref 4.0–10.5)

## 2013-05-19 LAB — COMPREHENSIVE METABOLIC PANEL
ALBUMIN: 4.2 g/dL (ref 3.5–5.2)
ALK PHOS: 179 U/L — AB (ref 39–117)
ALT: 268 U/L — ABNORMAL HIGH (ref 0–35)
AST: 318 U/L — ABNORMAL HIGH (ref 0–37)
BILIRUBIN TOTAL: 2.1 mg/dL — AB (ref 0.3–1.2)
BUN: 8 mg/dL (ref 6–23)
CHLORIDE: 101 meq/L (ref 96–112)
CO2: 25 mEq/L (ref 19–32)
Calcium: 10 mg/dL (ref 8.4–10.5)
Creatinine, Ser: 0.81 mg/dL (ref 0.50–1.10)
GFR calc Af Amer: >90 mL/min (ref >90)
Glucose, Bld: 110 mg/dL — ABNORMAL HIGH (ref 70–99)
POTASSIUM: 4.1 meq/L (ref 3.7–5.3)
Sodium: 143 mEq/L (ref 137–147)
Total Protein: 8.2 g/dL (ref 6.0–8.3)

## 2013-05-19 LAB — LACTIC ACID, PLASMA: LACTIC ACID, VENOUS: 0.9 mmol/L (ref 0.5–2.2)

## 2013-05-19 NOTE — ED Notes (Signed)
Pt states right upper quadrant pain. Recently diagnosed with gall stones. Pt states feels like gall bladder exacerbation.

## 2013-05-20 ENCOUNTER — Encounter (HOSPITAL_COMMUNITY): Payer: Self-pay | Admitting: Internal Medicine

## 2013-05-20 ENCOUNTER — Inpatient Hospital Stay (HOSPITAL_COMMUNITY)
Admission: EM | Admit: 2013-05-20 | Discharge: 2013-05-23 | DRG: 769 | Disposition: A | Discharge status: Home / Self Care | Payer: BC Managed Care – PPO | Attending: Internal Medicine | Admitting: Internal Medicine

## 2013-05-20 ENCOUNTER — Emergency Department (HOSPITAL_COMMUNITY): Payer: BC Managed Care – PPO

## 2013-05-20 DIAGNOSIS — K8050 Calculus of bile duct without cholangitis or cholecystitis without obstruction: Secondary | ICD-10-CM | POA: Diagnosis present

## 2013-05-20 DIAGNOSIS — K8042 Calculus of bile duct with acute cholecystitis without obstruction: Secondary | ICD-10-CM | POA: Diagnosis present

## 2013-05-20 DIAGNOSIS — Z87891 Personal history of nicotine dependence: Secondary | ICD-10-CM | POA: Diagnosis not present

## 2013-05-20 DIAGNOSIS — K8040 Calculus of bile duct with cholecystitis, unspecified, without obstruction: Secondary | ICD-10-CM

## 2013-05-20 DIAGNOSIS — O9089 Other complications of the puerperium, not elsewhere classified: Secondary | ICD-10-CM | POA: Diagnosis present

## 2013-05-20 DIAGNOSIS — R109 Unspecified abdominal pain: Secondary | ICD-10-CM | POA: Diagnosis present

## 2013-05-20 DIAGNOSIS — K859 Acute pancreatitis without necrosis or infection, unspecified: Secondary | ICD-10-CM | POA: Diagnosis present

## 2013-05-20 DIAGNOSIS — K8020 Calculus of gallbladder without cholecystitis without obstruction: Secondary | ICD-10-CM

## 2013-05-20 DIAGNOSIS — K8066 Calculus of gallbladder and bile duct with acute and chronic cholecystitis without obstruction: Secondary | ICD-10-CM | POA: Diagnosis present

## 2013-05-20 HISTORY — DX: Family history of other specified conditions: Z84.89

## 2013-05-20 LAB — BASIC METABOLIC PANEL
BUN: 6 mg/dL (ref 6–23)
CALCIUM: 8.7 mg/dL (ref 8.4–10.5)
CO2: 23 meq/L (ref 19–32)
CREATININE: 0.82 mg/dL (ref 0.50–1.10)
Chloride: 109 mEq/L (ref 96–112)
GFR calc Af Amer: >90 mL/min (ref >90)
GFR calc non Af Amer: >90 mL/min (ref >90)
Glucose, Bld: 91 mg/dL (ref 70–99)
Potassium: 3.7 mEq/L (ref 3.7–5.3)
Sodium: 146 mEq/L (ref 137–147)

## 2013-05-20 LAB — CBC WITH DIFFERENTIAL/PLATELET
BASOS ABS: 0 10*3/uL (ref 0.0–0.1)
BASOS PCT: 0 % (ref 0–1)
EOS ABS: 0.1 10*3/uL (ref 0.0–0.7)
EOS PCT: 1 % (ref 0–5)
HCT: 36.6 % (ref 36.0–46.0)
Hemoglobin: 12 g/dL (ref 12.0–15.0)
LYMPHS PCT: 30 % (ref 12–46)
Lymphs Abs: 1.5 10*3/uL (ref 0.7–4.0)
MCH: 26.8 pg (ref 26.0–34.0)
MCHC: 32.8 g/dL (ref 30.0–36.0)
MCV: 81.9 fL (ref 78.0–100.0)
MONO ABS: 0.6 10*3/uL (ref 0.1–1.0)
Monocytes Relative: 12 % (ref 3–12)
Neutro Abs: 2.9 10*3/uL (ref 1.7–7.7)
Neutrophils Relative %: 56 % (ref 43–77)
PLATELETS: 179 10*3/uL (ref 150–400)
RBC: 4.47 MIL/uL (ref 3.87–5.11)
RDW: 14.3 % (ref 11.5–15.5)
WBC: 5.2 10*3/uL (ref 4.0–10.5)

## 2013-05-20 LAB — CBG MONITORING, ED
Glucose-Capillary: 91 mg/dL (ref 70–99)
Glucose-Capillary: 79 mg/dL (ref 70–99)

## 2013-05-20 LAB — GLUCOSE, CAPILLARY
GLUCOSE-CAPILLARY: 77 mg/dL (ref 70–99)
Glucose-Capillary: 82 mg/dL (ref 70–99)

## 2013-05-20 LAB — SURGICAL PCR SCREEN
MRSA, PCR: NEGATIVE
Staphylococcus aureus: NEGATIVE

## 2013-05-20 LAB — LIPASE, BLOOD: Lipase: >3000 U/L — ABNORMAL HIGH (ref 11–59)

## 2013-05-20 LAB — PROTIME-INR
INR: 1.07 (ref 0.00–1.49)
PROTHROMBIN TIME: 13.7 s (ref 11.6–15.2)

## 2013-05-20 LAB — PREGNANCY, URINE: Preg Test, Ur: NEGATIVE

## 2013-05-20 MED ORDER — ACETAMINOPHEN 325 MG PO TABS: 650.0000 mg | ORAL_TABLET | Freq: Four times a day (QID) | ORAL | Status: DC | PRN

## 2013-05-20 MED ORDER — HYDROMORPHONE HCL PF 1 MG/ML IJ SOLN
1.0000 mg | INTRAMUSCULAR | Status: DC | PRN
  Administered 2013-05-20 – 2013-05-22 (×7): 1 mg via INTRAVENOUS
  Filled 2013-05-20 (×7): qty 1

## 2013-05-20 MED ORDER — SODIUM CHLORIDE 0.9 % IV SOLN
3.0000 g | Freq: Four times a day (QID) | INTRAVENOUS | Status: DC
  Administered 2013-05-20: 3 g via INTRAVENOUS
  Filled 2013-05-20: qty 3

## 2013-05-20 MED ORDER — HYDROMORPHONE HCL PF 1 MG/ML IJ SOLN
1.0000 mg | Freq: Once | INTRAMUSCULAR | Status: AC
  Administered 2013-05-20: 1 mg via INTRAVENOUS
  Filled 2013-05-20: qty 1

## 2013-05-20 MED ORDER — ONDANSETRON HCL 4 MG/2ML IJ SOLN
4.0000 mg | Freq: Once | INTRAMUSCULAR | Status: AC
  Administered 2013-05-20: 4 mg via INTRAVENOUS
  Filled 2013-05-20: qty 2

## 2013-05-20 MED ORDER — SODIUM CHLORIDE 0.9 % IV SOLN
INTRAVENOUS | Status: DC
Start: 2013-05-20 — End: 2013-05-21

## 2013-05-20 MED ORDER — ONDANSETRON HCL 4 MG PO TABS: 4.0000 mg | ORAL_TABLET | Freq: Four times a day (QID) | ORAL | Status: DC | PRN

## 2013-05-20 MED ORDER — SODIUM CHLORIDE 0.9 % IV SOLN
INTRAVENOUS | Status: DC
  Administered 2013-05-20 (×2): via INTRAVENOUS

## 2013-05-20 MED ORDER — FENTANYL CITRATE 0.05 MG/ML IJ SOLN: 50.0000 ug | INTRAMUSCULAR | Status: DC | PRN

## 2013-05-20 MED ORDER — ACETAMINOPHEN 650 MG RE SUPP: 650.0000 mg | Freq: Four times a day (QID) | RECTAL | Status: DC | PRN

## 2013-05-20 MED ORDER — DEXTROSE 5 % IV SOLN
1.0000 g | Freq: Once | INTRAVENOUS | Status: AC
  Administered 2013-05-20: 1 g via INTRAVENOUS
  Filled 2013-05-20: qty 1

## 2013-05-20 MED ORDER — PIPERACILLIN-TAZOBACTAM 3.375 G IVPB
3.3750 g | Freq: Three times a day (TID) | INTRAVENOUS | Status: DC
  Administered 2013-05-20 – 2013-05-22 (×7): 3.375 g via INTRAVENOUS
  Filled 2013-05-20 (×10): qty 50

## 2013-05-20 MED ORDER — ONDANSETRON HCL 4 MG/2ML IJ SOLN
4.0000 mg | Freq: Four times a day (QID) | INTRAMUSCULAR | Status: DC | PRN
  Administered 2013-05-22: 4 mg via INTRAVENOUS
  Filled 2013-05-20: qty 2

## 2013-05-20 MED ORDER — FENTANYL CITRATE 0.05 MG/ML IJ SOLN
50.0000 ug | Freq: Once | INTRAMUSCULAR | Status: AC
  Administered 2013-05-20: 50 ug via INTRAVENOUS
  Filled 2013-05-20: qty 2

## 2013-05-20 MED ORDER — MORPHINE SULFATE 2 MG/ML IJ SOLN: 1.0000 mg | INTRAMUSCULAR | Status: DC | PRN

## 2013-05-20 MED ORDER — SODIUM CHLORIDE 0.9 % IV SOLN
INTRAVENOUS | Status: AC
  Administered 2013-05-20: 12:00:00 via INTRAVENOUS

## 2013-05-20 NOTE — ED Notes (Signed)
Patient requests another pain medication since fentanyl was not helping at all.  Called Dr. Cameron Aliptiz's phone and spoke to [place], [place] about patient's pain management. No new orders received at this time.

## 2013-05-20 NOTE — Progress Notes (Signed)
[facility] Progress Note [place] TEAM 1 - Stepdown/ICU TEAM   Sydney [name] ZOX:[id number] DOB: 1995-05-25 DOA: 05/20/2013 PCP: Bennie PieriniMARTIN,MARY MARGARET, FNP  Brief narrative: Sydney [name] is a 18 y.o. female presenting on 05/19/2013 who about 4 wks postpartum and presents with upper abdominal pain and found to have choledocholithiasis and acute pancreatitis   Subjective: Pain in upper abdomen not too bad. No nausea. One episode of vomiting yesterday and none since.   Assessment/Plan: Principal Problem:   Choledocholithiasis with cholecystitis  - ERCP in AM - surgery has evaluated and will do cholecystectomy at a later date - cont Zosyn  Active Problems:   Acute gallstone pancreatitis - NPO, aggressive IVF- increase rate on IVF    Code Status: Full code Family Communication: with parents Disposition Plan: likely home   Consultants: Gi Surgery  Procedures: none  Antibiotics: Anti-infectives   Start     Dose/Rate Route Frequency Ordered Stop   05/20/13 1500  piperacillin-tazobactam (ZOSYN) IVPB 3.375 g     3.375 g 12.5 mL/hr over 240 Minutes Intravenous 3 times per day 05/20/13 1113     05/20/13 0900  Ampicillin-Sulbactam (UNASYN) 3 g in sodium chloride 0.9 % 100 mL IVPB  Status:  Discontinued     3 g 100 mL/hr over 60 Minutes Intravenous Every 6 hours 05/20/13 0806 05/20/13 1105   05/20/13 0615  cefoTAXime (CLAFORAN) 1 g in dextrose 5 % 50 mL IVPB     1 g 100 mL/hr over 30 Minutes Intravenous  Once 05/20/13 0611 05/20/13 0720        DVT prophylaxis: SCDs  Objective: Filed Weights   05/19/13 2209  Weight: 84.823 kg (187 lb)   Blood pressure 115/68, pulse 86, temperature 98.5 F (36.9 C), temperature source Oral, resp. rate 18, height 5\' 4"  (1.626 m), weight 84.823 kg (187 lb), SpO2 100.00%, not currently breastfeeding. No intake or output data in the 24 hours ending 05/20/13 1553   Exam: General: No acute respiratory distress Lungs: Clear to  auscultation bilaterally without wheezes or crackles Cardiovascular: Regular rate and rhythm without murmur gallop or rub normal S1 and S2 Abdomen: Nontender, nondistended, soft, bowel sounds positive, no rebound, no ascites, no appreciable mass Extremities: No significant cyanosis, clubbing, or edema bilateral lower extremities  Data Reviewed: Basic Metabolic Panel:  Recent Labs Lab 05/19/13 2225 05/20/13 1157  NA 143 146  K 4.1 3.7  CL 101 109  CO2 25 23  GLUCOSE 110* 91  BUN 8 6  CREATININE 0.81 0.82  CALCIUM 10.0 8.7   Liver Function Tests:  Recent Labs Lab 05/19/13 2225  AST 318*  ALT 268*  ALKPHOS 179*  BILITOT 2.1*  PROT 8.2  ALBUMIN 4.2    Recent Labs Lab 05/20/13 0617  LIPASE >3000*   No results found for this basename: AMMONIA,  in the last 168 hours CBC:  Recent Labs Lab 05/19/13 2225 05/20/13 1157  WBC 6.8 5.2  NEUTROABS 4.9 2.9  HGB 13.9 12.0  HCT 41.7 36.6  MCV 81.3 81.9  PLT 238 179   Cardiac Enzymes: No results found for this basename: CKTOTAL, CKMB, CKMBINDEX, TROPONINI,  in the last 168 hours BNP (last 3 results) No results found for this basename: PROBNP,  in the last 8760 hours CBG:  Recent Labs Lab 05/20/13 1156  GLUCAP 91    No results found for this or any previous visit (from the past 240 hour(s)).   Studies:  Recent x-ray studies have been reviewed in detail by  the Attending Physician  Scheduled Meds:  Scheduled Meds:  Continuous Infusions: . sodium chloride 100 mL/hr at 05/20/13 1211  . piperacillin-tazobactam (ZOSYN)  IV      Time spent on care of this patient: 25 min   Calvert Cantor, MD 05/20/2013, 3:53 PM  LOS: 0 days   [facility] Office  [phone] Pager - Text Page per Loretha Stapler   If 7PM-7AM, please contact night-coverage [email or web address]

## 2013-05-20 NOTE — ED Notes (Signed)
Discussed with Dr. Dierdre Highmanpitz that patient has fentanyl 50mcg ordered under protocol.  MD allows, and will also order zofran. Preparing patient for ultrasound.

## 2013-05-20 NOTE — H&P (Signed)
[facility] History and Physical  Sydney [name] ZOX:[id number] DOB: Mar 11, 1995 DOA: 05/20/2013  Referring physician: ER physician. PCP: Bennie PieriniMARTIN,MARY MARGARET, FNP   Chief Complaint: Abdominal pain.  HPI: Sydney [name] is a 18 y.o. female presents to the ER with worsening abdominal pain. Patient has been having abdominal pain off and on for the last one year. Patient was diagnosed with gallstones. Yesterday patient's pain was more persistent than before with nausea vomiting and decided to come to the ER later. In the ER patient's LFTs were elevated and sonogram of abdomen shows possible CBD stones with acute cholecystitis. At this time on call surgeon Dr. Donell BeersByerly was consulted and they have also requested patient may need gastroenterology consult as patient has several CBD stones. Patient otherwise denies any chest pain short of breath diarrhea. Patient had a normal vaginal delivery last month.   Review of Systems: As presented in the history of presenting illness, rest negative.  Past Medical History  Diagnosis Date  . Medical history non-contributory   . Gallstones    Past Surgical History  Procedure Laterality Date  . No past surgeries     Social History:  reports that she has quit smoking. Her smoking use included Cigarettes. She smoked 1.00 pack per day. She has never used smokeless tobacco. She reports that she does not drink alcohol or use illicit drugs. Where does patient live home. Can patient participate in ADLs? Yes.  Allergies  Allergen Reactions  . Latex Rash    Family History:  Family History  Problem Relation Age of Onset  . Healthy Mother   . Healthy Father   . Healthy Brother   . Hypertension Maternal Grandmother   . Stroke Maternal Grandfather   . Diabetes Paternal Grandmother   . Diabetes Paternal Grandfather       Prior to Admission medications   Medication Sig Start Date End Date Taking? Authorizing Provider  acetaminophen (TYLENOL) 500 MG tablet  Take 1,000 mg by mouth every 6 (six) hours as needed for moderate pain.   Yes Historical Provider, MD  ibuprofen (ADVIL,MOTRIN) 600 MG tablet Take 1 tablet (600 mg total) by mouth every 6 (six) hours. 04/14/13  Yes Minta BalsamMichael R Odom, MD  ondansetron (ZOFRAN ODT) 8 MG disintegrating tablet Take 1 tablet (8 mg total) by mouth every 8 (eight) hours as needed for nausea. 04/25/13  Yes Derwood KaplanAnkit Nanavati, MD    Physical Exam: Filed Vitals:   05/19/13 2209 05/20/13 0400 05/20/13 0445 05/20/13 0545  BP: 135/87 130/69 131/73 105/56  Pulse: 74 71 83 83  Temp: 97.4 F (36.3 C) 97.1 F (36.2 C)    TempSrc: Oral Oral    Resp: 18 18    Height: 5\' 4"  (1.626 m)     Weight: 84.823 kg (187 lb)     SpO2: 99% 100% 100% 97%     General:  Well-developed and nourished.  Eyes: Anicteric no pallor.  ENT: No discharge from the ears eyes nose mouth.  Neck: No mass felt.  Cardiovascular: S1-S2 heard.  Respiratory: No rhonchi or crepitations.  Abdomen: Soft mild tenderness in the right upper quadrant. No guarding or rigidity.  Skin: No rash.  Musculoskeletal: No edema.  Psychiatric: Appears normal.  Neurologic: Alert. Oriented to time place and person. Moves all extremities.  Labs on Admission:  Basic Metabolic Panel:  Recent Labs Lab 05/19/13 2225  NA 143  K 4.1  CL 101  CO2 25  GLUCOSE 110*  BUN 8  CREATININE 0.81  CALCIUM 10.0  Liver Function Tests:  Recent Labs Lab 05/19/13 2225  AST 318*  ALT 268*  ALKPHOS 179*  BILITOT 2.1*  PROT 8.2  ALBUMIN 4.2   No results found for this basename: LIPASE, AMYLASE,  in the last 168 hours No results found for this basename: AMMONIA,  in the last 168 hours CBC:  Recent Labs Lab 05/19/13 2225  WBC 6.8  NEUTROABS 4.9  HGB 13.9  HCT 41.7  MCV 81.3  PLT 238   Cardiac Enzymes: No results found for this basename: CKTOTAL, CKMB, CKMBINDEX, TROPONINI,  in the last 168 hours  BNP (last 3 results) No results found for this basename:  PROBNP,  in the last 8760 hours CBG: No results found for this basename: GLUCAP,  in the last 168 hours  Radiological Exams on Admission: US Abdomen Limited Ruq  05/20/2013   CLINICAL DATA:  Right upper quadrant pain.  EXAM: US ABDOMEN LIMITED - RIGHT UPPER QUADRANT  COMPARISON:  03/15/2013  FINDINGS: Gallbladder:  There are small shadowing stones within the gallbladder. Borderline wall thickening at 3-4 mm. There is focal tenderness per sonographer exam. No pericholecystic edema.  Common bile duct:  Diameter: 10 mm, newly dilated from previous.  No documented stone.  Liver:  No focal lesion identified. Within normal limits in parenchymal echogenicity. Antegrade flow in the imaged portal venous system.  IMPRESSION: 1. New biliary dilation/obstruction. Although not demonstrated sonographically, this is likely from choledocholithiasis. 2. Gallbladder tenderness with cholelithiasis and mild wall thickening - possible early acute cholecystitis.   Electronically Signed   By: Tiburcio Pea M.D.   On: 05/20/2013 05:24    Assessment/Plan Principal Problem:   Choledocholithiasis with cholecystitis Active Problems:   Choledocholithiasis with acute cholecystitis   1. Choledocholithiasis with acute cholecystitis - I have discussed with on-call surgeon Dr. Donell Beers and gastroenterologist Dr. Loreta Ave. Patient at this time will be kept n.p.o. in anticipation of GI procedure ERCP/surgery. Patient is on Zosyn. Follow liver function tests. 2. Elevated LFTs - probably from #1.    Code Status: Full code.  Family Communication: Patient's parents at the bedside. Disposition Plan: Admit to inpatient.    Eduard Clos [facility] Pager [phone].  If 7PM-7AM, please contact night-coverage [email or web address] Password [facility] 05/20/2013, 6:48 AM

## 2013-05-20 NOTE — Consult Note (Signed)
Unassigned Consult  Reason for Consult: Choledocholithiasis and gallstone pancreatitis Referring Physician: [facility]  Kathia Ziglar HPI: This is a 18 year old female G1P1 with a recent diagnosis of symptomatic gallstones admitted for gallstone pancreatitis.  She is currently scheduled to undergo a lap chole with Dr. Derrell Lollingamirez on 05/26/2013.  During the third trimester she was noted to have symptomatic gallstones and the plan was to wait until she was post partum for the lap chole.  Last evening she started to have acute abdominal pain and this time her pain was unremitting.  Further evaluation upon her arrival to the ER revealed a lipase >3000, elevated liver enzymes, and a CBD dilated at 10 mm.  As a result of the findings a GI consultation was requested.  Past Medical History  Diagnosis Date  . Medical history non-contributory   . Gallstones     Past Surgical History  Procedure Laterality Date  . No past surgeries      Family History  Problem Relation Age of Onset  . Healthy Mother   . Healthy Father   . Healthy Brother   . Hypertension Maternal Grandmother   . Stroke Maternal Grandfather   . Diabetes Paternal Grandmother   . Diabetes Paternal Grandfather     Social History:  reports that she has quit smoking. Her smoking use included Cigarettes. She smoked 1.00 pack per day. She has never used smokeless tobacco. She reports that she does not drink alcohol or use illicit drugs.  Allergies:  Allergies  Allergen Reactions  . Latex Rash    Medications:  Scheduled:  Continuous: . sodium chloride 100 mL/hr at 05/20/13 1211  . piperacillin-tazobactam (ZOSYN)  IV      Results for orders placed during the hospital encounter of 05/20/13 (from the past 24 hour(s))  COMPREHENSIVE METABOLIC PANEL     Status: Abnormal   Collection Time    05/19/13 10:25 PM      Result Value Ref Range   Sodium 143  137 - 147 mEq/L   Potassium 4.1  3.7 - 5.3 mEq/L   Chloride 101  96 - 112  mEq/L   CO2 25  19 - 32 mEq/L   Glucose, Bld 110 (*) 70 - 99 mg/dL   BUN 8  6 - 23 mg/dL   Creatinine, Ser 9.600.81  0.50 - 1.10 mg/dL   Calcium 45.410.0  8.4 - 09.810.5 mg/dL   Total Protein 8.2  6.0 - 8.3 g/dL   Albumin 4.2  3.5 - 5.2 g/dL   AST 119318 (*) 0 - 37 U/L   ALT 268 (*) 0 - 35 U/L   Alkaline Phosphatase 179 (*) 39 - 117 U/L   Total Bilirubin 2.1 (*) 0.3 - 1.2 mg/dL   GFR calc non Af Amer >90  >90 mL/min   GFR calc Af Amer >90  >90 mL/min  CBC WITH DIFFERENTIAL     Status: Abnormal   Collection Time    05/19/13 10:25 PM      Result Value Ref Range   WBC 6.8  4.0 - 10.5 K/uL   RBC 5.13 (*) 3.87 - 5.11 MIL/uL   Hemoglobin 13.9  12.0 - 15.0 g/dL   HCT 14.741.7  82.936.0 - 56.246.0 %   MCV 81.3  78.0 - 100.0 fL   MCH 27.1  26.0 - 34.0 pg   MCHC 33.3  30.0 - 36.0 g/dL   RDW 13.013.9  86.511.5 - 78.415.5 %   Platelets 238  150 - 400  K/uL   Neutrophils Relative % 72  43 - 77 %   Neutro Abs 4.9  1.7 - 7.7 K/uL   Lymphocytes Relative 20  12 - 46 %   Lymphs Abs 1.4  0.7 - 4.0 K/uL   Monocytes Relative 7  3 - 12 %   Monocytes Absolute 0.5  0.1 - 1.0 K/uL   Eosinophils Relative 1  0 - 5 %   Eosinophils Absolute 0.1  0.0 - 0.7 K/uL   Basophils Relative 0  0 - 1 %   Basophils Absolute 0.0  0.0 - 0.1 K/uL  LACTIC ACID, PLASMA     Status: None   Collection Time    05/19/13 10:25 PM      Result Value Ref Range   Lactic Acid, Venous 0.9  0.5 - 2.2 mmol/L  LIPASE, BLOOD     Status: Abnormal   Collection Time    05/20/13  6:17 AM      Result Value Ref Range   Lipase >3000 (*) 11 - 59 U/L  PREGNANCY, URINE     Status: None   Collection Time    05/20/13  7:36 AM      Result Value Ref Range   Preg Test, Ur NEGATIVE  NEGATIVE  CBG MONITORING, ED     Status: None   Collection Time    05/20/13 11:56 AM      Result Value Ref Range   Glucose-Capillary 91  70 - 99 mg/dL  BASIC METABOLIC PANEL     Status: None   Collection Time    05/20/13 11:57 AM      Result Value Ref Range   Sodium 146  137 - 147 mEq/L    Potassium 3.7  3.7 - 5.3 mEq/L   Chloride 109  96 - 112 mEq/L   CO2 23  19 - 32 mEq/L   Glucose, Bld 91  70 - 99 mg/dL   BUN 6  6 - 23 mg/dL   Creatinine, Ser 1.61  0.50 - 1.10 mg/dL   Calcium 8.7  8.4 - 09.6 mg/dL   GFR calc non Af Amer >90  >90 mL/min   GFR calc Af Amer >90  >90 mL/min  CBC WITH DIFFERENTIAL     Status: None   Collection Time    05/20/13 11:57 AM      Result Value Ref Range   WBC 5.2  4.0 - 10.5 K/uL   RBC 4.47  3.87 - 5.11 MIL/uL   Hemoglobin 12.0  12.0 - 15.0 g/dL   HCT 04.5  40.9 - 81.1 %   MCV 81.9  78.0 - 100.0 fL   MCH 26.8  26.0 - 34.0 pg   MCHC 32.8  30.0 - 36.0 g/dL   RDW 91.4  78.2 - 95.6 %   Platelets 179  150 - 400 K/uL   Neutrophils Relative % 56  43 - 77 %   Neutro Abs 2.9  1.7 - 7.7 K/uL   Lymphocytes Relative 30  12 - 46 %   Lymphs Abs 1.5  0.7 - 4.0 K/uL   Monocytes Relative 12  3 - 12 %   Monocytes Absolute 0.6  0.1 - 1.0 K/uL   Eosinophils Relative 1  0 - 5 %   Eosinophils Absolute 0.1  0.0 - 0.7 K/uL   Basophils Relative 0  0 - 1 %   Basophils Absolute 0.0  0.0 - 0.1 K/uL  PROTIME-INR     Status: None  Collection Time    05/20/13 11:57 AM      Result Value Ref Range   Prothrombin Time 13.7  11.6 - 15.2 seconds   INR 1.07  0.00 - 1.49     US Abdomen Limited Ruq  05/20/2013   CLINICAL DATA:  Right upper quadrant pain.  EXAM: US ABDOMEN LIMITED - RIGHT UPPER QUADRANT  COMPARISON:  03/15/2013  FINDINGS: Gallbladder:  There are small shadowing stones within the gallbladder. Borderline wall thickening at 3-4 mm. There is focal tenderness per sonographer exam. No pericholecystic edema.  Common bile duct:  Diameter: 10 mm, newly dilated from previous.  No documented stone.  Liver:  No focal lesion identified. Within normal limits in parenchymal echogenicity. Antegrade flow in the imaged portal venous system.  IMPRESSION: 1. New biliary dilation/obstruction. Although not demonstrated sonographically, this is likely from choledocholithiasis. 2.  Gallbladder tenderness with cholelithiasis and mild wall thickening - possible early acute cholecystitis.   Electronically Signed   By: Tiburcio Pea M.D.   On: 05/20/2013 05:24    ROS:  As stated above in the HPI otherwise negative.  Blood pressure 103/57, pulse 74, temperature 98.5 F (36.9 C), temperature source Oral, resp. rate 18, height 5\' 4"  (1.626 m), weight 187 lb (84.823 kg), SpO2 99.00%, not currently breastfeeding.    PE: Gen: NAD, Alert and Oriented HEENT:  [place]/AT, EOMI Neck: Supple, no LAD Lungs: CTA Bilaterally CV: RRR without M/G/R ABM: Soft, NTND, +BS Ext: No C/C/E  Assessment/Plan: 1) Choledocholithiasis. 2) Gallstone pancreatitis. 3) Cholelithiasis.   I will perform an ERCP.  I explained to her the procedure and the possibility of worsening her pancreatitis.  He understands and wishes to proceed with the ERCP.  Plan: 1) ERCP tomorrow AM.   Theda Belfast 05/20/2013, 2:15 PM

## 2013-05-20 NOTE — ED Notes (Signed)
ATTEMPTED TO CALL REPORT 

## 2013-05-20 NOTE — ED Notes (Signed)
Surgeon at bedside.  

## 2013-05-20 NOTE — ED Notes (Signed)
Pharmacy called for unasyn 

## 2013-05-20 NOTE — Progress Notes (Signed)
NURSING PROGRESS NOTE  Sydney [name] [id number] Admission Data: 05/20/2013 8:14 PM Attending Provider: Eduard ClosArshad N Kakrakandy, MD WUJ:WJXBJY,NWGNPCP:MARTIN,MARY MARGARET, FNP Code Status: Full  Sydney [name] is a 18 y.o. female patient admitted from ED:  -No acute distress noted.  -No complaints of shortness of breath.  -No complaints of chest pain.   Cardiac Monitoring:N/A   Blood pressure 113/60, pulse 79, temperature 97.8 F (36.6 C), temperature source Oral, resp. rate 20, height 5\' 4"  (1.626 m), weight 84.823 kg (187 lb), SpO2 100.00%, not currently breastfeeding.   IV Fluids:  IV in place, occlusive dsg intact without redness, IV cath antecubital right, condition patent and no redness normal saline.   Allergies:  Latex  Past Medical History:   has a past medical history of Medical history non-contributory and Gallstones.  Past Surgical History:   has past surgical history that includes No past surgeries.  Social History:   reports that she has quit smoking. Her smoking use included Cigarettes. She smoked 1.00 pack per day. She has never used smokeless tobacco. She reports that she does not drink alcohol or use illicit drugs.  Skin: intact   Patient/Family orientated to room. Information packet given to patient/family. Admission inpatient armband information verified with patient/family to include name and date of birth and placed on patient arm. Side rails up x 2, fall assessment and education completed with patient/family. Patient/family able to verbalize understanding of risk associated with falls and verbalized understanding to call for assistance before getting out of bed. Call light within reach. Patient/family able to voice and demonstrate understanding of unit orientation instructions.    Will continue to evaluate and treat per MD orders.

## 2013-05-20 NOTE — ED Notes (Signed)
Pt to be seen by GI MD.

## 2013-05-20 NOTE — ED Notes (Signed)
Pt preparing to provide urine sample.

## 2013-05-20 NOTE — Consult Note (Signed)
Reason for Consult: choledocholithiasis, gallstone pancreatitis  Referring Physician: Dr. Gean Birchwood   HPI: Sydney [name] is a healthy 18 year old female 1 month post partum who presents to [facility] with abdominal pain.  Duration of symptoms is 2 years.  Symptoms were intermittent, typically after she eats subway or high fat foods.  She was seen at [facility] in February and found to have abnormal LTFs and gallstones.  She was asked to wait until after delivery in March and was seen by Dr. Rosendo Gros on the 26th of March.  She was scheduled for a laparoscopic appendectomy with IOC on the 15th.  Her pain worsened yesterday and became more constant.  Associated with nausea and vomiting.  She denies fever, chills or sweats.  Denies melena or hematochezia.  Last oral intake was yesterday at 3PM.  Modifying factors include; zofran and ibuprofen with some help.  Aggravated by food.  No alleviating factors.  Location of pain is band like in the epigastric region.  Severe in severity.  Labs today reveal elevated LFTs with bilirubin of 2.1.  Lipase >3000. Normal white count.  US showed a 32m CBD, gallbladder thickening.  GI is on board.  We have been asked to evaluate the patient for a cholecystectomy.    Past Medical History  Diagnosis Date  . Medical history non-contributory   . Gallstones     Past Surgical History  Procedure Laterality Date  . No past surgeries      Family History  Problem Relation Age of Onset  . Healthy Mother   . Healthy Father   . Healthy Brother   . Hypertension Maternal Grandmother   . Stroke Maternal Grandfather   . Diabetes Paternal Grandmother   . Diabetes Paternal Grandfather     Social History:  reports that she has quit smoking. Her smoking use included Cigarettes. She smoked 1.00 pack per day. She has never used smokeless tobacco. She reports that she does not drink alcohol or use illicit drugs.  Allergies:  Allergies  Allergen Reactions  . Latex Rash     Medications:  Scheduled Meds:  Continuous Infusions: . sodium chloride 125 mL/hr at 05/20/13 0538   PRN Meds:.fentaNYL, HYDROmorphone (DILAUDID) injection  Results for orders placed during the hospital encounter of 05/20/13 (from the past 48 hour(s))  COMPREHENSIVE METABOLIC PANEL     Status: Abnormal   Collection Time    05/19/13 10:25 PM      Result Value Ref Range   Sodium 143  137 - 147 mEq/L   Potassium 4.1  3.7 - 5.3 mEq/L   Chloride 101  96 - 112 mEq/L   CO2 25  19 - 32 mEq/L   Glucose, Bld 110 (*) 70 - 99 mg/dL   BUN 8  6 - 23 mg/dL   Creatinine, Ser 0.81  0.50 - 1.10 mg/dL   Calcium 10.0  8.4 - 10.5 mg/dL   Total Protein 8.2  6.0 - 8.3 g/dL   Albumin 4.2  3.5 - 5.2 g/dL   AST 318 (*) 0 - 37 U/L   ALT 268 (*) 0 - 35 U/L   Alkaline Phosphatase 179 (*) 39 - 117 U/L   Total Bilirubin 2.1 (*) 0.3 - 1.2 mg/dL   GFR calc non Af Amer >90  >90 mL/min   GFR calc Af Amer >90  >90 mL/min   Comment: (NOTE)     The eGFR has been calculated using the CKD EPI equation.     This calculation  has not been validated in all clinical situations.     eGFR's persistently <90 mL/min signify possible Chronic Kidney     Disease.  CBC WITH DIFFERENTIAL     Status: Abnormal   Collection Time    05/19/13 10:25 PM      Result Value Ref Range   WBC 6.8  4.0 - 10.5 K/uL   RBC 5.13 (*) 3.87 - 5.11 MIL/uL   Hemoglobin 13.9  12.0 - 15.0 g/dL   HCT 41.7  36.0 - 46.0 %   MCV 81.3  78.0 - 100.0 fL   MCH 27.1  26.0 - 34.0 pg   MCHC 33.3  30.0 - 36.0 g/dL   RDW 13.9  11.5 - 15.5 %   Platelets 238  150 - 400 K/uL   Neutrophils Relative % 72  43 - 77 %   Neutro Abs 4.9  1.7 - 7.7 K/uL   Lymphocytes Relative 20  12 - 46 %   Lymphs Abs 1.4  0.7 - 4.0 K/uL   Monocytes Relative 7  3 - 12 %   Monocytes Absolute 0.5  0.1 - 1.0 K/uL   Eosinophils Relative 1  0 - 5 %   Eosinophils Absolute 0.1  0.0 - 0.7 K/uL   Basophils Relative 0  0 - 1 %   Basophils Absolute 0.0  0.0 - 0.1 K/uL  LACTIC ACID,  PLASMA     Status: None   Collection Time    05/19/13 10:25 PM      Result Value Ref Range   Lactic Acid, Venous 0.9  0.5 - 2.2 mmol/L  LIPASE, BLOOD     Status: Abnormal   Collection Time    05/20/13  6:17 AM      Result Value Ref Range   Lipase >3000 (*) 11 - 59 U/L   Comment: RESULT CONFIRMED BY AUTOMATED DILUTION    US Abdomen Limited Ruq  05/20/2013   CLINICAL DATA:  Right upper quadrant pain.  EXAM: US ABDOMEN LIMITED - RIGHT UPPER QUADRANT  COMPARISON:  03/15/2013  FINDINGS: Gallbladder:  There are small shadowing stones within the gallbladder. Borderline wall thickening at 3-4 mm. There is focal tenderness per sonographer exam. No pericholecystic edema.  Common bile duct:  Diameter: 10 mm, newly dilated from previous.  No documented stone.  Liver:  No focal lesion identified. Within normal limits in parenchymal echogenicity. Antegrade flow in the imaged portal venous system.  IMPRESSION: 1. New biliary dilation/obstruction. Although not demonstrated sonographically, this is likely from choledocholithiasis. 2. Gallbladder tenderness with cholelithiasis and mild wall thickening - possible early acute cholecystitis.   Electronically Signed   By: Jorje Guild M.D.   On: 05/20/2013 05:24    Review of Systems  All other systems reviewed and are negative.  Blood pressure 97/53, pulse 76, temperature 97.1 F (36.2 C), temperature source Oral, resp. rate 18, height 5' 4"  (1.626 m), weight 187 lb (84.823 kg), SpO2 97.00%, not currently breastfeeding. Physical Exam  Constitutional: She is oriented to person, place, and time. She appears well-developed and well-nourished. No distress.  HENT:  Head: Normocephalic and atraumatic.  Neck: Normal range of motion. Neck supple.  Cardiovascular: Normal rate, regular rhythm, normal heart sounds and intact distal pulses.  Exam reveals no gallop and no friction rub.   No murmur heard. Respiratory: Effort normal and breath sounds normal. No  respiratory distress. She has no wheezes. She has no rales. She exhibits no tenderness.  GI: Soft. Bowel sounds are  normal. She exhibits no distension and no mass. There is no rebound and no guarding.  Mild ttp to RUQ, moderate ttp to LUQ  Musculoskeletal: Normal range of motion. She exhibits no edema and no tenderness.  Lymphadenopathy:    She has no cervical adenopathy.  Neurological: She is alert and oriented to person, place, and time.  Skin: Skin is warm and dry. No rash noted. She is not diaphoretic. No erythema. No pallor.  Psychiatric: She has a normal mood and affect. Her behavior is normal. Judgment and thought content normal.    Assessment/Plan: Choledocholithiasis Gallstone pancreatitis  Cholelithiasis with possible early cholecystitis   Internal medicine to admit IV hydration pain control NPO Add Unasyn given pain and gallbladder wall thickening Repeat LFTs and lipase in AM We will plan to do a laparoscopic cholecystectomy once pancreatitis improves and GI work up is complete.  I discussed the plan of care with the patient and her mother.  Surgery will continue to follow.    Roland Lipke ANP-BC Pager [phone] 05/20/2013, 7:56 AM

## 2013-05-20 NOTE — Consult Note (Signed)
Patient with gallstone pancreatitis and possibly CBD stone.  Probably to have surgery after clinically the pancreatitis has resolved.  Marta LamasJames O. Gae BonWyatt, III, MD, FACS [phone]--pager [phone]--office [facility]

## 2013-05-20 NOTE — ED Notes (Signed)
Called ultrasound to see if patient is in need of pain medication at this time.  Testing is almost finished, and they plan on bringing patient back to room soon.

## 2013-05-20 NOTE — ED Notes (Signed)
Delay in bed explained to family; family understanding; do not need anything at this time; pt resting.

## 2013-05-20 NOTE — Progress Notes (Signed)
ANTIBIOTIC CONSULT NOTE - INITIAL  Pharmacy Consult for Zosyn Indication: Intra-abdominal infection  Allergies  Allergen Reactions  . Latex Rash    Patient Measurements: Height: 5\' 4"  (162.6 cm) Weight: 187 lb (84.823 kg) IBW/kg (Calculated) : 54.7 Adjusted Body Weight: n/a  Vital Signs: Temp: 98.5 F (36.9 C) (04/09 0940) Temp src: Oral (04/09 0940) BP: 103/57 mmHg (04/09 1030) Pulse Rate: 74 (04/09 1030) Intake/Output from previous day:   Intake/Output from this shift:    Labs:  Recent Labs  05/19/13 2225  WBC 6.8  HGB 13.9  PLT 238  CREATININE 0.81   Estimated Creatinine Clearance: 117.6 ml/min (by C-G formula based on Cr of 0.81). No results found for this basename: Rolm GalaVANCOTROUGH, VANCOPEAK, VANCORANDOM, GENTTROUGH, GENTPEAK, GENTRANDOM, TOBRATROUGH, TOBRAPEAK, TOBRARND, AMIKACINPEAK, AMIKACINTROU, AMIKACIN,  in the last 72 hours   Microbiology: Recent Results (from the past 720 hour(s))  URINE CULTURE     Status: None   Collection Time    04/25/13 12:18 AM      Result Value Ref Range Status   Specimen Description URINE, CLEAN CATCH   Final   Special Requests ADDED 0224   Final   Culture  Setup Time     Final   Value: 04/25/2013 14:48     Performed at [facility]   Colony Count     Final   Value: 60,000 COLONIES/ML     Performed at [facility]   Culture     Final   Value: Multiple bacterial morphotypes present, none predominant. Suggest appropriate recollection if clinically indicated.     Performed at [facility]   Report Status 04/26/2013 FINAL   Final    Medical History: Past Medical History  Diagnosis Date  . Medical history non-contributory   . Gallstones     Medications:   (Not in a hospital admission) Assessment: [age] YOF who [facility], nausea and vomiting. She was found to have abnormal LFTs and gallstones in February. US showed biliary dilation/obstruction and gallbladder thickening  with possible early cholecystitis. Pharmacy has been consulted to start Zosyn. Plan is to do a laparoscopic cholecystectomy once pancreatitis improves and GI work up is complete. Currently on Unasyn which will be d/c'ed. WBC wnl, CrCl > 100 mL/min. Afebrile.  Goal of Therapy:  Resolution of infection   Plan:  1) Zosyn 3.375 gm IV q 8 hours  2) Monitor CBC, renal fx, and patient's clinical progression  Vinnie LevelBenjamin Huxley Shurley, PharmD.  Clinical Pharmacist Pager [phone]

## 2013-05-20 NOTE — ED Provider Notes (Signed)
CSN: [id number]     Arrival date & time 05/19/13  2200 History   First MD Initiated Contact with Patient 05/20/13 [phone]     Chief Complaint  Patient presents with  . Abdominal Pain     (Consider location/radiation/quality/duration/timing/severity/associated sxs/prior Treatment) HPI History provided by patient. Has known gallstones, diagnosed when she was pregnant. She did not get followup or surgery. Since yesterday having right upper quadrant pain, constant with associated nausea but no vomiting. No fevers or chills. Pain is sharp in quality and not radiating, feels like previous episodes associated with gallstones.  No known alleviating factors.   Past Medical History  Diagnosis Date  . Medical history non-contributory   . Gallstones    Past Surgical History  Procedure Laterality Date  . No past surgeries     Family History  Problem Relation Age of Onset  . Healthy Mother   . Healthy Father   . Healthy Brother   . Hypertension Maternal Grandmother   . Stroke Maternal Grandfather   . Diabetes Paternal Grandmother   . Diabetes Paternal Grandfather    History  Substance Use Topics  . Smoking status: Former Smoker -- 1.00 packs/day    Types: Cigarettes  . Smokeless tobacco: Never Used  . Alcohol Use: No   OB History   Grav Para Term Preterm Abortions TAB SAB Ect Mult Living   1 1 1       1      Review of Systems  Constitutional: Negative for fever and chills.  Respiratory: Negative for shortness of breath.   Cardiovascular: Negative for chest pain.  Gastrointestinal: Positive for nausea and abdominal pain.  Genitourinary: Negative for dysuria.  Musculoskeletal: Negative for back pain, neck pain and neck stiffness.  Skin: Negative for rash.  Neurological: Negative for weakness and headaches.  All other systems reviewed and are negative.     Allergies  Latex  Home Medications   Current Outpatient Rx  Name  Route  Sig  Dispense  Refill  . acetaminophen  (TYLENOL) 500 MG tablet   Oral   Take 1,000 mg by mouth every 6 (six) hours as needed for moderate pain.         Marland Kitchen ibuprofen (ADVIL,MOTRIN) 600 MG tablet   Oral   Take 1 tablet (600 mg total) by mouth every 6 (six) hours.   30 tablet   0   . ondansetron (ZOFRAN ODT) 8 MG disintegrating tablet   Oral   Take 1 tablet (8 mg total) by mouth every 8 (eight) hours as needed for nausea.   20 tablet   0    BP 130/69  Pulse 71  Temp(Src) 97.1 F (36.2 C) (Oral)  Resp 18  Ht 5\' 4"  (1.626 m)  Wt 187 lb (84.823 kg)  BMI 32.08 kg/m2  SpO2 100% Physical Exam  Constitutional: She is oriented to person, place, and time. She appears well-developed and well-nourished.  HENT:  Head: Normocephalic and atraumatic.  Eyes: EOM are normal. Pupils are equal, round, and reactive to light.  Neck: Neck supple.  Cardiovascular: Normal rate, regular rhythm and intact distal pulses.   Pulmonary/Chest: Effort normal and breath sounds normal. No respiratory distress.  Abdominal: Soft. Bowel sounds are normal. She exhibits no distension.  Tender right upper quadrant. No CVA tenderness. No abdominal tenderness otherwise.  Musculoskeletal: Normal range of motion. She exhibits no edema.  Neurological: She is alert and oriented to person, place, and time. No cranial nerve deficit.  Skin: Skin is  warm and dry.    ED Course  Procedures (including critical care time) Labs Review Labs Reviewed  COMPREHENSIVE METABOLIC PANEL - Abnormal; Notable for the following:    Glucose, Bld 110 (*)    AST 318 (*)    ALT 268 (*)    Alkaline Phosphatase 179 (*)    Total Bilirubin 2.1 (*)    All other components within normal limits  CBC WITH DIFFERENTIAL - Abnormal; Notable for the following:    RBC 5.13 (*)    All other components within normal limits  LACTIC ACID, PLASMA   Imaging Review [place] Abdomen Limited Ruq  05/20/2013   CLINICAL DATA:  Right upper quadrant pain.  EXAM: US ABDOMEN LIMITED - RIGHT UPPER  QUADRANT  COMPARISON:  03/15/2013  FINDINGS: Gallbladder:  There are small shadowing stones within the gallbladder. Borderline wall thickening at 3-4 mm. There is focal tenderness per sonographer exam. No pericholecystic edema.  Common bile duct:  Diameter: 10 mm, newly dilated from previous.  No documented stone.  Liver:  No focal lesion identified. Within normal limits in parenchymal echogenicity. Antegrade flow in the imaged portal venous system.  IMPRESSION: 1. New biliary dilation/obstruction. Although not demonstrated sonographically, this is likely from choledocholithiasis. 2. Gallbladder tenderness with cholelithiasis and mild wall thickening - possible early acute cholecystitis.   Electronically Signed   By: Tiburcio PeaJonathan  Watts M.D.   On: 05/20/2013 05:24    IV fluids. IV fentanyl. IV Zofran. NPO  5:50 AM d/w [facility] DR Donell BeersByerly. [facility] will evaluate. Dr Toniann FailKakrakandy to admit. IV ABx initiated.  MDM   Dx: Cholelithiasis, biliary dilation on ultrasound, elevated liver enzymes, possible cholecystitis  History of gallstones presents with persistent pain, elevated liver enzymes on labs reviewed as above. Ultrasound reviewed as above and discussed with general surgery. Plan medical admission. [facility] will evaluate in ER  Sunnie NielsenBrian Evvie Behrmann, MD 05/21/13 [phone]

## 2013-05-20 NOTE — ED Notes (Signed)
CBG 91 

## 2013-05-20 NOTE — ED Notes (Signed)
Patient transported to Ultrasound 

## 2013-05-20 NOTE — ED Notes (Signed)
Patient returned from ultrasound. Will adminster pain medication.

## 2013-05-20 NOTE — ED Notes (Signed)
Pt resting; family updated on POC; waiting for GI consulting MD. Beverages offered.

## 2013-05-20 NOTE — ED Notes (Signed)
Discussed removing jewelry and sending home with parents.  Patient removes and sends jewelry with mom in plastic ziploc bag.

## 2013-05-21 ENCOUNTER — Inpatient Hospital Stay (HOSPITAL_COMMUNITY): Payer: BC Managed Care – PPO | Admitting: Anesthesiology

## 2013-05-21 ENCOUNTER — Inpatient Hospital Stay (HOSPITAL_COMMUNITY): Payer: BC Managed Care – PPO

## 2013-05-21 ENCOUNTER — Encounter (HOSPITAL_COMMUNITY): Payer: Self-pay | Admitting: Critical Care Medicine

## 2013-05-21 ENCOUNTER — Encounter (HOSPITAL_COMMUNITY)
Admission: EM | Disposition: A | Discharge status: Home / Self Care | Payer: Self-pay | Source: Home / Self Care | Attending: Internal Medicine

## 2013-05-21 ENCOUNTER — Encounter (HOSPITAL_COMMUNITY): Payer: BC Managed Care – PPO | Admitting: Anesthesiology

## 2013-05-21 DIAGNOSIS — K859 Acute pancreatitis without necrosis or infection, unspecified: Secondary | ICD-10-CM

## 2013-05-21 HISTORY — PX: ERCP: SHX5425

## 2013-05-21 LAB — BASIC METABOLIC PANEL
BUN: 6 mg/dL (ref 6–23)
CALCIUM: 9.1 mg/dL (ref 8.4–10.5)
CO2: 23 mEq/L (ref 19–32)
CREATININE: 0.93 mg/dL (ref 0.50–1.10)
Chloride: 104 mEq/L (ref 96–112)
GFR calc Af Amer: >90 mL/min (ref >90)
GFR calc non Af Amer: 89 mL/min — ABNORMAL LOW (ref >90)
GLUCOSE: 74 mg/dL (ref 70–99)
Potassium: 3.4 mEq/L — ABNORMAL LOW (ref 3.7–5.3)
Sodium: 142 mEq/L (ref 137–147)

## 2013-05-21 LAB — HEPATIC FUNCTION PANEL
ALK PHOS: 209 U/L — AB (ref 39–117)
ALT: 286 U/L — ABNORMAL HIGH (ref 0–35)
AST: 179 U/L — AB (ref 0–37)
Albumin: 3.1 g/dL — ABNORMAL LOW (ref 3.5–5.2)
BILIRUBIN TOTAL: 1 mg/dL (ref 0.3–1.2)
Bilirubin, Direct: 0.3 mg/dL (ref 0.0–0.3)
Indirect Bilirubin: 0.7 mg/dL (ref 0.3–0.9)
Total Protein: 6.5 g/dL (ref 6.0–8.3)

## 2013-05-21 LAB — CBC
HCT: 36.2 % (ref 36.0–46.0)
Hemoglobin: 11.8 g/dL — ABNORMAL LOW (ref 12.0–15.0)
MCH: 26.9 pg (ref 26.0–34.0)
MCHC: 32.6 g/dL (ref 30.0–36.0)
MCV: 82.6 fL (ref 78.0–100.0)
PLATELETS: 187 10*3/uL (ref 150–400)
RBC: 4.38 MIL/uL (ref 3.87–5.11)
RDW: 14.5 % (ref 11.5–15.5)
WBC: 5.5 10*3/uL (ref 4.0–10.5)

## 2013-05-21 LAB — LIPASE, BLOOD: Lipase: 124 U/L — ABNORMAL HIGH (ref 11–59)

## 2013-05-21 LAB — GLUCOSE, CAPILLARY
GLUCOSE-CAPILLARY: 81 mg/dL (ref 70–99)
Glucose-Capillary: 78 mg/dL (ref 70–99)
Glucose-Capillary: 66 mg/dL — ABNORMAL LOW (ref 70–99)

## 2013-05-21 SURGERY — ERCP, WITH INTERVENTION IF INDICATED
Anesthesia: General

## 2013-05-21 MED ORDER — POTASSIUM CHLORIDE 10 MEQ/100ML IV SOLN
10.0000 meq | INTRAVENOUS | Status: AC
  Administered 2013-05-21: 10 meq via INTRAVENOUS
  Filled 2013-05-21: qty 100

## 2013-05-21 MED ORDER — LIDOCAINE HCL (CARDIAC) 20 MG/ML IV SOLN
INTRAVENOUS | Status: DC | PRN
  Administered 2013-05-21: 70 mg via INTRAVENOUS

## 2013-05-21 MED ORDER — DEXAMETHASONE SODIUM PHOSPHATE 4 MG/ML IJ SOLN
INTRAMUSCULAR | Status: DC | PRN
  Administered 2013-05-21: 4 mg via INTRAVENOUS

## 2013-05-21 MED ORDER — FENTANYL CITRATE 0.05 MG/ML IJ SOLN
INTRAMUSCULAR | Status: DC | PRN
  Administered 2013-05-21: 50 ug via INTRAVENOUS

## 2013-05-21 MED ORDER — PROMETHAZINE HCL 25 MG/ML IJ SOLN: 6.2500 mg | INTRAMUSCULAR | Status: DC | PRN

## 2013-05-21 MED ORDER — LACTATED RINGERS IV SOLN
INTRAVENOUS | Status: DC | PRN
  Administered 2013-05-21: 09:00:00 via INTRAVENOUS

## 2013-05-21 MED ORDER — ONDANSETRON HCL 4 MG/2ML IJ SOLN
INTRAMUSCULAR | Status: DC | PRN
  Administered 2013-05-21: 4 mg via INTRAVENOUS

## 2013-05-21 MED ORDER — OXYCODONE HCL 5 MG PO TABS: 5.0000 mg | ORAL_TABLET | Freq: Once | ORAL | Status: DC | PRN

## 2013-05-21 MED ORDER — SODIUM CHLORIDE 0.9 % IV SOLN
INTRAVENOUS | Status: DC | PRN
  Administered 2013-05-21: 11:00:00

## 2013-05-21 MED ORDER — PHENYLEPHRINE HCL 10 MG/ML IJ SOLN
INTRAMUSCULAR | Status: DC | PRN
  Administered 2013-05-21 (×2): 80 ug via INTRAVENOUS
  Administered 2013-05-21: 40 ug via INTRAVENOUS

## 2013-05-21 MED ORDER — OXYCODONE HCL 5 MG/5ML PO SOLN: 5.0000 mg | Freq: Once | ORAL | Status: DC | PRN

## 2013-05-21 MED ORDER — POTASSIUM CHLORIDE 10 MEQ/100ML IV SOLN
10.0000 meq | INTRAVENOUS | Status: AC
  Administered 2013-05-21 (×3): 10 meq via INTRAVENOUS

## 2013-05-21 MED ORDER — DEXTROSE-NACL 5-0.45 % IV SOLN
INTRAVENOUS | Status: DC
  Administered 2013-05-21: 14:00:00 via INTRAVENOUS

## 2013-05-21 MED ORDER — MIDAZOLAM HCL 5 MG/5ML IJ SOLN
INTRAMUSCULAR | Status: DC | PRN
  Administered 2013-05-21: 2 mg via INTRAVENOUS

## 2013-05-21 MED ORDER — FENTANYL CITRATE 0.05 MG/ML IJ SOLN: 25.0000 ug | INTRAMUSCULAR | Status: DC | PRN

## 2013-05-21 MED ORDER — SUCCINYLCHOLINE CHLORIDE 20 MG/ML IJ SOLN
INTRAMUSCULAR | Status: DC | PRN
  Administered 2013-05-21: 100 mg via INTRAVENOUS

## 2013-05-21 MED ORDER — PROPOFOL 10 MG/ML IV BOLUS
INTRAVENOUS | Status: DC | PRN
  Administered 2013-05-21: 200 mg via INTRAVENOUS

## 2013-05-21 NOTE — Progress Notes (Signed)
No CBD stones.  Lipase down to 124.  Probably can have Lap chole tomorrow or Sunday.  Marta LamasJames O. Gae BonWyatt, III, MD, FACS [phone]--pager [phone]--office [facility]

## 2013-05-21 NOTE — Progress Notes (Signed)
Patient placed in prone position on chest pads. Elbows and knees protected.  Feet on pillow.  bair hugger placed on patient.  Post procedure patient returned to supine position.

## 2013-05-21 NOTE — Interval H&P Note (Signed)
History and Physical Interval Note:  05/21/2013 9:28 AM  Sydney [name]  has presented today for surgery, with the diagnosis of Choledocholithiasis  The various methods of treatment have been discussed with the patient and family. After consideration of risks, benefits and other options for treatment, the patient has consented to  Procedure(s): ENDOSCOPIC RETROGRADE CHOLANGIOPANCREATOGRAPHY (ERCP) (N/A) as a surgical intervention .  The patient's history has been reviewed, patient examined, no change in status, stable for surgery.  I have reviewed the patient's chart and labs.  Questions were answered to the patient's satisfaction.     Theda BelfastPatrick D Kailey Esquilin

## 2013-05-21 NOTE — Op Note (Signed)
[facility] [address] [place], [id number]   ERCP PROCEDURE REPORT  PATIENT: Sydney [name], Irie  MR# :[id number] BIRTHDATE: 03-18-1995  GENDER: Female ENDOSCOPIST: Jeani HawkingPatrick Buford Gayler, MD REFERRED BY: PROCEDURE DATE:  05/21/2013 PROCEDURE:   ERCP with removal of calculus/calculi ASA CLASS:   Class II INDICATIONS: Choledocholithiasis MEDICATIONS: MAC sedation, administered by CRNA TOPICAL ANESTHETIC: none  DESCRIPTION OF PROCEDURE:   After the risks benefits and alternatives of the procedure were thoroughly explained, informed consent was obtained.  The     endoscope was introduced through the mouth  and advanced to the second portion of the duodenum.  A brief view of the gastric fundus revealed erythema.  This may be secondary to irritation from her vomiting.  The ampulla was located in the second portion of the duodenum.  Cannulation of the CBD was easy and achived during the first attempt.  No cannulation of the PD occurred.  The guidewire was secured in the right intrahepatic ducts.  Contast injection revealed a mildly dilated CBD at 8 mm without any evidence of filling defects.  A 1 cm sphincterotomy was created and the CBD was swept three times.  There was no evidence of stones or sludge.  A final occlusion cholangiogram was negative for any evidence of retained stones. The scope was then completely withdrawn from the patient and the procedure terminated.     COMPLICATIONS: No immediate complications.  ENDOSCOPIC IMPRESSION: 1) Mildly dilated CBD without evidence of CBD stones. 2) Probable traumatic erythema in the fundus of the stomach.  RECOMMENDATIONS: 1) Lap chole per Surgery.    _______________________________ eSignedJeani Hawking:  Faustine Tates, MD 05/21/2013 10:40 AM   CC:

## 2013-05-21 NOTE — Progress Notes (Signed)
[facility] Progress Note [place] TEAM 1 - Stepdown/ICU TEAM   Jarelis Ehlert ZOX:[id number] DOB: 02-Feb-1996 DOA: 05/20/2013 PCP: Bennie Pierini, FNP  Brief narrative: Sydney [name] is a 18 y.o. female presenting on 05/19/2013 who about 4 wks postpartum and presents with upper abdominal pain and found to have choledocholithiasis and acute pancreatitis   Subjective: Mild soreness in RUQ  Assessment/Plan: Principal Problem:   Choledocholithiasis with cholecystitis  - ERCP done but no obstruction seen- likely passed the stone - surgery has evaluated and will do cholecystectomy tomorrow - cont antibiotics per surgery  Active Problems:   Acute gallstone pancreatitis - resolving      Code Status: Full code Family Communication: with parents Disposition Plan: likely home   Consultants: Gi Surgery  Procedures: none  Antibiotics: Anti-infectives   Start     Dose/Rate Route Frequency Ordered Stop   05/20/13 1500  piperacillin-tazobactam (ZOSYN) IVPB 3.375 g     3.375 g 12.5 mL/hr over 240 Minutes Intravenous 3 times per day 05/20/13 1113     05/20/13 0900  Ampicillin-Sulbactam (UNASYN) 3 g in sodium chloride 0.9 % 100 mL IVPB  Status:  Discontinued     3 g 100 mL/hr over 60 Minutes Intravenous Every 6 hours 05/20/13 0806 05/20/13 1105   05/20/13 0615  cefoTAXime (CLAFORAN) 1 g in dextrose 5 % 50 mL IVPB     1 g 100 mL/hr over 30 Minutes Intravenous  Once 05/20/13 0611 05/20/13 0720        DVT prophylaxis: SCDs  Objective: Filed Weights   05/19/13 2209 05/20/13 1702  Weight: 84.823 kg (187 lb) 84.823 kg (187 lb)   Blood pressure 119/67, pulse 91, temperature 98 F (36.7 C), temperature source Oral, resp. rate 15, height 5\' 4"  (1.626 m), weight 84.823 kg (187 lb), SpO2 100.00%, not currently breastfeeding.  Intake/Output Summary (Last 24 hours) at 05/21/13 1507 Last data filed at 05/21/13 1429  Gross per 24 hour  Intake    800 ml  Output      0 ml   Net    800 ml     Exam: General: No acute respiratory distress Lungs: Clear to auscultation bilaterally without wheezes or crackles Cardiovascular: Regular rate and rhythm without murmur gallop or rub normal S1 and S2 Abdomen: mild tenderness in LUQ, nondistended, soft, bowel sounds positive, no rebound, no ascites, no appreciable mass Extremities: No significant cyanosis, clubbing, or edema bilateral lower extremities  Data Reviewed: Basic Metabolic Panel:  Recent Labs Lab 05/19/13 2225 05/20/13 1157 05/21/13 0500  NA 143 146 142  K 4.1 3.7 3.4*  CL 101 109 104  CO2 25 23 23   GLUCOSE 110* 91 74  BUN 8 6 6   CREATININE 0.81 0.82 0.93  CALCIUM 10.0 8.7 9.1   Liver Function Tests:  Recent Labs Lab 05/19/13 2225 05/21/13 0500  AST 318* 179*  ALT 268* 286*  ALKPHOS 179* 209*  BILITOT 2.1* 1.0  PROT 8.2 6.5  ALBUMIN 4.2 3.1*    Recent Labs Lab 05/20/13 0617 05/21/13 0500  LIPASE >3000* 124*   No results found for this basename: AMMONIA,  in the last 168 hours CBC:  Recent Labs Lab 05/19/13 2225 05/20/13 1157 05/21/13 0500  WBC 6.8 5.2 5.5  NEUTROABS 4.9 2.9  --   HGB 13.9 12.0 11.8*  HCT 41.7 36.6 36.2  MCV 81.3 81.9 82.6  PLT 238 179 187   Cardiac Enzymes: No results found for this basename: CKTOTAL, CKMB, CKMBINDEX, TROPONINI,  in  the last 168 hours BNP (last 3 results) No results found for this basename: PROBNP,  in the last 8760 hours CBG:  Recent Labs Lab 05/20/13 1936 05/20/13 2333 05/21/13 0414 05/21/13 0743 05/21/13 1157  GLUCAP 77 82 81 78 66*    Recent Results (from the past 240 hour(s))  SURGICAL PCR SCREEN     Status: None   Collection Time    05/20/13  8:19 PM      Result Value Ref Range Status   MRSA, PCR NEGATIVE  NEGATIVE Final   Staphylococcus aureus NEGATIVE  NEGATIVE Final   Comment:            The Xpert SA Assay (FDA     approved for NASAL specimens     in patients over 20 years of age),     is one component of      a comprehensive surveillance     program.  Test performance has     been validated by [facility] for patients greater     than or equal to 735 year old.     It is not intended     to diagnose infection nor to     guide or monitor treatment.     Studies:  Recent x-ray studies have been reviewed in detail by the Attending Physician  Scheduled Meds:  Scheduled Meds: . piperacillin-tazobactam (ZOSYN)  IV  3.375 g Intravenous 3 times per day  . potassium chloride  10 mEq Intravenous Q1 Hr x 4   Continuous Infusions: . dextrose 5 % and 0.45% NaCl 75 mL/hr at 05/21/13 1421    Time spent on care of this patient: 25 min   Calvert CantorSaima Jayleigh Notarianni, MD 05/21/2013, 3:07 PM  LOS: 1 day   [facility] Office  [phone] Pager - Text Page per Loretha StaplerAmion   If 7PM-7AM, please contact night-coverage [email or web address]

## 2013-05-21 NOTE — Progress Notes (Signed)
Patient ID: Sydney [name], female   DOB: 1995-10-03, 18 y.o.   MRN: [id number] Day of Surgery  Subjective: Pt feels well this morning.  No complaints  Objective: Vital signs in last 24 hours: Temp:  [97.7 F (36.5 C)-98.5 F (36.9 C)] 98 F (36.7 C) (04/10 0547) Pulse Rate:  [56-94] 57 (04/10 0547) Resp:  [16-20] 16 (04/10 0547) BP: (95-119)/(42-71) 105/52 mmHg (04/10 0631) SpO2:  [97 %-100 %] 99 % (04/10 0547) Weight:  [187 lb (84.823 kg)] 187 lb (84.823 kg) (04/09 1702) Last BM Date: 05/18/13  Intake/Output from previous day:   Intake/Output this shift:    PE: Abd: soft, NT, ND, +BS  Lab Results:   Recent Labs  05/20/13 1157 05/21/13 0500  WBC 5.2 5.5  HGB 12.0 11.8*  HCT 36.6 36.2  PLT 179 187   BMET  Recent Labs  05/20/13 1157 05/21/13 0500  NA 146 142  K 3.7 3.4*  CL 109 104  CO2 23 23  GLUCOSE 91 74  BUN 6 6  CREATININE 0.82 0.93  CALCIUM 8.7 9.1   PT/INR  Recent Labs  05/20/13 1157  LABPROT 13.7  INR 1.07   CMP     Component Value Date/Time   NA 142 05/21/2013 0500   K 3.4* 05/21/2013 0500   CL 104 05/21/2013 0500   CO2 23 05/21/2013 0500   GLUCOSE 74 05/21/2013 0500   BUN 6 05/21/2013 0500   CREATININE 0.93 05/21/2013 0500   CALCIUM 9.1 05/21/2013 0500   PROT 6.5 05/21/2013 0500   ALBUMIN 3.1* 05/21/2013 0500   AST 179* 05/21/2013 0500   ALT 286* 05/21/2013 0500   ALKPHOS 209* 05/21/2013 0500   BILITOT 1.0 05/21/2013 0500   GFRNONAA 89* 05/21/2013 0500   GFRAA >90 05/21/2013 0500   Lipase     Component Value Date/Time   LIPASE 124* 05/21/2013 0500       Studies/Results: US Abdomen Limited Ruq  05/20/2013   CLINICAL DATA:  Right upper quadrant pain.  EXAM: US ABDOMEN LIMITED - RIGHT UPPER QUADRANT  COMPARISON:  03/15/2013  FINDINGS: Gallbladder:  There are small shadowing stones within the gallbladder. Borderline wall thickening at 3-4 mm. There is focal tenderness per sonographer exam. No pericholecystic edema.  Common bile duct:   Diameter: 10 mm, newly dilated from previous.  No documented stone.  Liver:  No focal lesion identified. Within normal limits in parenchymal echogenicity. Antegrade flow in the imaged portal venous system.  IMPRESSION: 1. New biliary dilation/obstruction. Although not demonstrated sonographically, this is likely from choledocholithiasis. 2. Gallbladder tenderness with cholelithiasis and mild wall thickening - possible early acute cholecystitis.   Electronically Signed   By: Tiburcio Pea M.D.   On: 05/20/2013 05:24    Anti-infectives: Anti-infectives   Start     Dose/Rate Route Frequency Ordered Stop   05/20/13 1500  [MAR Hold]  piperacillin-tazobactam (ZOSYN) IVPB 3.375 g     (On MAR Hold since 05/21/13 0837)   3.375 g 12.5 mL/hr over 240 Minutes Intravenous 3 times per day 05/20/13 1113     05/20/13 0900  Ampicillin-Sulbactam (UNASYN) 3 g in sodium chloride 0.9 % 100 mL IVPB  Status:  Discontinued     3 g 100 mL/hr over 60 Minutes Intravenous Every 6 hours 05/20/13 0806 05/20/13 1105   05/20/13 0615  cefoTAXime (CLAFORAN) 1 g in dextrose 5 % 50 mL IVPB     1 g 100 mL/hr over 30 Minutes Intravenous  Once 05/20/13 0611 05/20/13 0720  Assessment/Plan  1. Choledocholithiasis  Plan: 1. Will plan on lap chole tomorrow after ERCP today. 2. NPO p MN   LOS: 1 day    Letha CapeKelly E Kimmy [name] 05/21/2013, 8:43 AM Pager: [phone]

## 2013-05-21 NOTE — H&P (View-Only) (Signed)
Unassigned Consult  Reason for Consult: Choledocholithiasis and gallstone pancreatitis Referring Physician: [facility]  Kathia Ziglar HPI: This is a 18 year old female G1P1 with a recent diagnosis of symptomatic gallstones admitted for gallstone pancreatitis.  She is currently scheduled to undergo a lap chole with Dr. Derrell Lollingamirez on 05/26/2013.  During the third trimester she was noted to have symptomatic gallstones and the plan was to wait until she was post partum for the lap chole.  Last evening she started to have acute abdominal pain and this time her pain was unremitting.  Further evaluation upon her arrival to the ER revealed a lipase >3000, elevated liver enzymes, and a CBD dilated at 10 mm.  As a result of the findings a GI consultation was requested.  Past Medical History  Diagnosis Date  . Medical history non-contributory   . Gallstones     Past Surgical History  Procedure Laterality Date  . No past surgeries      Family History  Problem Relation Age of Onset  . Healthy Mother   . Healthy Father   . Healthy Brother   . Hypertension Maternal Grandmother   . Stroke Maternal Grandfather   . Diabetes Paternal Grandmother   . Diabetes Paternal Grandfather     Social History:  reports that she has quit smoking. Her smoking use included Cigarettes. She smoked 1.00 pack per day. She has never used smokeless tobacco. She reports that she does not drink alcohol or use illicit drugs.  Allergies:  Allergies  Allergen Reactions  . Latex Rash    Medications:  Scheduled:  Continuous: . sodium chloride 100 mL/hr at 05/20/13 1211  . piperacillin-tazobactam (ZOSYN)  IV      Results for orders placed during the hospital encounter of 05/20/13 (from the past 24 hour(s))  COMPREHENSIVE METABOLIC PANEL     Status: Abnormal   Collection Time    05/19/13 10:25 PM      Result Value Ref Range   Sodium 143  137 - 147 mEq/L   Potassium 4.1  3.7 - 5.3 mEq/L   Chloride 101  96 - 112  mEq/L   CO2 25  19 - 32 mEq/L   Glucose, Bld 110 (*) 70 - 99 mg/dL   BUN 8  6 - 23 mg/dL   Creatinine, Ser 9.600.81  0.50 - 1.10 mg/dL   Calcium 45.410.0  8.4 - 09.810.5 mg/dL   Total Protein 8.2  6.0 - 8.3 g/dL   Albumin 4.2  3.5 - 5.2 g/dL   AST 119318 (*) 0 - 37 U/L   ALT 268 (*) 0 - 35 U/L   Alkaline Phosphatase 179 (*) 39 - 117 U/L   Total Bilirubin 2.1 (*) 0.3 - 1.2 mg/dL   GFR calc non Af Amer >90  >90 mL/min   GFR calc Af Amer >90  >90 mL/min  CBC WITH DIFFERENTIAL     Status: Abnormal   Collection Time    05/19/13 10:25 PM      Result Value Ref Range   WBC 6.8  4.0 - 10.5 K/uL   RBC 5.13 (*) 3.87 - 5.11 MIL/uL   Hemoglobin 13.9  12.0 - 15.0 g/dL   HCT 14.741.7  82.936.0 - 56.246.0 %   MCV 81.3  78.0 - 100.0 fL   MCH 27.1  26.0 - 34.0 pg   MCHC 33.3  30.0 - 36.0 g/dL   RDW 13.013.9  86.511.5 - 78.415.5 %   Platelets 238  150 - 400  K/uL   Neutrophils Relative % 72  43 - 77 %   Neutro Abs 4.9  1.7 - 7.7 K/uL   Lymphocytes Relative 20  12 - 46 %   Lymphs Abs 1.4  0.7 - 4.0 K/uL   Monocytes Relative 7  3 - 12 %   Monocytes Absolute 0.5  0.1 - 1.0 K/uL   Eosinophils Relative 1  0 - 5 %   Eosinophils Absolute 0.1  0.0 - 0.7 K/uL   Basophils Relative 0  0 - 1 %   Basophils Absolute 0.0  0.0 - 0.1 K/uL  LACTIC ACID, PLASMA     Status: None   Collection Time    05/19/13 10:25 PM      Result Value Ref Range   Lactic Acid, Venous 0.9  0.5 - 2.2 mmol/L  LIPASE, BLOOD     Status: Abnormal   Collection Time    05/20/13  6:17 AM      Result Value Ref Range   Lipase >3000 (*) 11 - 59 U/L  PREGNANCY, URINE     Status: None   Collection Time    05/20/13  7:36 AM      Result Value Ref Range   Preg Test, Ur NEGATIVE  NEGATIVE  CBG MONITORING, ED     Status: None   Collection Time    05/20/13 11:56 AM      Result Value Ref Range   Glucose-Capillary 91  70 - 99 mg/dL  BASIC METABOLIC PANEL     Status: None   Collection Time    05/20/13 11:57 AM      Result Value Ref Range   Sodium 146  137 - 147 mEq/L    Potassium 3.7  3.7 - 5.3 mEq/L   Chloride 109  96 - 112 mEq/L   CO2 23  19 - 32 mEq/L   Glucose, Bld 91  70 - 99 mg/dL   BUN 6  6 - 23 mg/dL   Creatinine, Ser 1.61  0.50 - 1.10 mg/dL   Calcium 8.7  8.4 - 09.6 mg/dL   GFR calc non Af Amer >90  >90 mL/min   GFR calc Af Amer >90  >90 mL/min  CBC WITH DIFFERENTIAL     Status: None   Collection Time    05/20/13 11:57 AM      Result Value Ref Range   WBC 5.2  4.0 - 10.5 K/uL   RBC 4.47  3.87 - 5.11 MIL/uL   Hemoglobin 12.0  12.0 - 15.0 g/dL   HCT 04.5  40.9 - 81.1 %   MCV 81.9  78.0 - 100.0 fL   MCH 26.8  26.0 - 34.0 pg   MCHC 32.8  30.0 - 36.0 g/dL   RDW 91.4  78.2 - 95.6 %   Platelets 179  150 - 400 K/uL   Neutrophils Relative % 56  43 - 77 %   Neutro Abs 2.9  1.7 - 7.7 K/uL   Lymphocytes Relative 30  12 - 46 %   Lymphs Abs 1.5  0.7 - 4.0 K/uL   Monocytes Relative 12  3 - 12 %   Monocytes Absolute 0.6  0.1 - 1.0 K/uL   Eosinophils Relative 1  0 - 5 %   Eosinophils Absolute 0.1  0.0 - 0.7 K/uL   Basophils Relative 0  0 - 1 %   Basophils Absolute 0.0  0.0 - 0.1 K/uL  PROTIME-INR     Status: None  Collection Time    05/20/13 11:57 AM      Result Value Ref Range   Prothrombin Time 13.7  11.6 - 15.2 seconds   INR 1.07  0.00 - 1.49     US Abdomen Limited Ruq  05/20/2013   CLINICAL DATA:  Right upper quadrant pain.  EXAM: US ABDOMEN LIMITED - RIGHT UPPER QUADRANT  COMPARISON:  03/15/2013  FINDINGS: Gallbladder:  There are small shadowing stones within the gallbladder. Borderline wall thickening at 3-4 mm. There is focal tenderness per sonographer exam. No pericholecystic edema.  Common bile duct:  Diameter: 10 mm, newly dilated from previous.  No documented stone.  Liver:  No focal lesion identified. Within normal limits in parenchymal echogenicity. Antegrade flow in the imaged portal venous system.  IMPRESSION: 1. New biliary dilation/obstruction. Although not demonstrated sonographically, this is likely from choledocholithiasis. 2.  Gallbladder tenderness with cholelithiasis and mild wall thickening - possible early acute cholecystitis.   Electronically Signed   By: Tiburcio Pea M.D.   On: 05/20/2013 05:24    ROS:  As stated above in the HPI otherwise negative.  Blood pressure 103/57, pulse 74, temperature 98.5 F (36.9 C), temperature source Oral, resp. rate 18, height 5\' 4"  (1.626 m), weight 187 lb (84.823 kg), SpO2 99.00%, not currently breastfeeding.    PE: Gen: NAD, Alert and Oriented HEENT:  [place]/AT, EOMI Neck: Supple, no LAD Lungs: CTA Bilaterally CV: RRR without M/G/R ABM: Soft, NTND, +BS Ext: No C/C/E  Assessment/Plan: 1) Choledocholithiasis. 2) Gallstone pancreatitis. 3) Cholelithiasis.   I will perform an ERCP.  I explained to her the procedure and the possibility of worsening her pancreatitis.  He understands and wishes to proceed with the ERCP.  Plan: 1) ERCP tomorrow AM.   Theda Belfast 05/20/2013, 2:15 PM

## 2013-05-21 NOTE — Anesthesia Preprocedure Evaluation (Addendum)
Anesthesia Evaluation  Patient identified by MRN, date of birth, ID band Patient awake    Reviewed: Allergy & Precautions, H&P , NPO status , Patient's Chart, lab work & pertinent test results  Airway Mallampati: I      Dental  (+) Dental Advisory Given, Teeth Intact   Pulmonary former smoker,  breath sounds clear to auscultation        Cardiovascular Rhythm:Regular Rate:Normal     Neuro/Psych    GI/Hepatic   Endo/Other    Renal/GU      Musculoskeletal   Abdominal   Peds  Hematology   Anesthesia Other Findings   Reproductive/Obstetrics                          Anesthesia Physical Anesthesia Plan  ASA: I  Anesthesia Plan: General   Post-op Pain Management:    Induction: Intravenous  Airway Management Planned: Oral ETT  Additional Equipment:   Intra-op Plan:   Post-operative Plan: Extubation in OR  Informed Consent: I have reviewed the patients History and Physical, chart, labs and discussed the procedure including the risks, benefits and alternatives for the proposed anesthesia with the patient or authorized representative who has indicated his/her understanding and acceptance.   Dental advisory given  Plan Discussed with: Surgeon and Anesthesiologist  Anesthesia Plan Comments:         Anesthesia Quick Evaluation

## 2013-05-21 NOTE — Transfer of Care (Signed)
Immediate Anesthesia Transfer of Care Note  Patient: Sydney [name]  Procedure(s) Performed: Procedure(s) with comments: ENDOSCOPIC RETROGRADE CHOLANGIOPANCREATOGRAPHY (ERCP) (N/A) - 1006 induction  Patient Location: Endoscopy Unit  Anesthesia Type:General  Level of Consciousness: awake, alert  and oriented  Airway & Oxygen Therapy: Patient Spontanous Breathing and Patient connected to nasal cannula oxygen  Post-op Assessment: Report given to PACU RN, Post -op Vital signs reviewed and stable and Patient moving all extremities X 4  Post vital signs: Reviewed and stable  Complications: No apparent anesthesia complications

## 2013-05-21 NOTE — Anesthesia Postprocedure Evaluation (Signed)
  Anesthesia Post-op Note  Patient: Sydney [name]  Procedure(s) Performed: Procedure(s) with comments: ENDOSCOPIC RETROGRADE CHOLANGIOPANCREATOGRAPHY (ERCP) (N/A) - 1006 induction  Patient Location: Endoscopy Unit  Anesthesia Type:General  Level of Consciousness: awake and alert   Airway and Oxygen Therapy: Patient Spontanous Breathing  Post-op Pain: none  Post-op Assessment: Post-op Vital signs reviewed  Post-op Vital Signs: stable  Last Vitals:  Filed Vitals:   05/21/13 1055  BP: 103/46  Pulse: 91  Temp:   Resp: 17    Complications: No apparent anesthesia complications

## 2013-05-22 ENCOUNTER — Inpatient Hospital Stay (HOSPITAL_COMMUNITY): Payer: BC Managed Care – PPO | Admitting: Certified Registered"

## 2013-05-22 ENCOUNTER — Encounter (HOSPITAL_COMMUNITY): Payer: Self-pay | Admitting: Certified Registered"

## 2013-05-22 ENCOUNTER — Encounter (HOSPITAL_COMMUNITY): Payer: BC Managed Care – PPO | Admitting: Certified Registered"

## 2013-05-22 ENCOUNTER — Encounter (HOSPITAL_COMMUNITY)
Admission: EM | Disposition: A | Discharge status: Home / Self Care | Payer: Self-pay | Source: Home / Self Care | Attending: Internal Medicine

## 2013-05-22 DIAGNOSIS — K859 Acute pancreatitis without necrosis or infection, unspecified: Secondary | ICD-10-CM

## 2013-05-22 DIAGNOSIS — K8010 Calculus of gallbladder with chronic cholecystitis without obstruction: Secondary | ICD-10-CM

## 2013-05-22 DIAGNOSIS — K8050 Calculus of bile duct without cholangitis or cholecystitis without obstruction: Secondary | ICD-10-CM | POA: Diagnosis present

## 2013-05-22 HISTORY — PX: CHOLECYSTECTOMY: SHX55

## 2013-05-22 LAB — GLUCOSE, CAPILLARY
Glucose-Capillary: 76 mg/dL (ref 70–99)
Glucose-Capillary: 74 mg/dL (ref 70–99)
Glucose-Capillary: 73 mg/dL (ref 70–99)
Glucose-Capillary: 94 mg/dL (ref 70–99)

## 2013-05-22 LAB — COMPREHENSIVE METABOLIC PANEL
ALK PHOS: 190 U/L — AB (ref 39–117)
ALT: 187 U/L — AB (ref 0–35)
AST: 73 U/L — ABNORMAL HIGH (ref 0–37)
Albumin: 3.3 g/dL — ABNORMAL LOW (ref 3.5–5.2)
BUN: 5 mg/dL — AB (ref 6–23)
CALCIUM: 9.4 mg/dL (ref 8.4–10.5)
CO2: 22 mEq/L (ref 19–32)
Chloride: 102 mEq/L (ref 96–112)
Creatinine, Ser: 0.76 mg/dL (ref 0.50–1.10)
GLUCOSE: 82 mg/dL (ref 70–99)
POTASSIUM: 3.8 meq/L (ref 3.7–5.3)
Sodium: 140 mEq/L (ref 137–147)
Total Bilirubin: 0.7 mg/dL (ref 0.3–1.2)
Total Protein: 6.8 g/dL (ref 6.0–8.3)

## 2013-05-22 LAB — CBC
HCT: 39 % (ref 36.0–46.0)
Hemoglobin: 12.8 g/dL (ref 12.0–15.0)
MCH: 26.9 pg (ref 26.0–34.0)
MCHC: 32.8 g/dL (ref 30.0–36.0)
MCV: 82.1 fL (ref 78.0–100.0)
PLATELETS: 222 10*3/uL (ref 150–400)
RBC: 4.75 MIL/uL (ref 3.87–5.11)
RDW: 14.2 % (ref 11.5–15.5)
WBC: 8.4 10*3/uL (ref 4.0–10.5)

## 2013-05-22 LAB — CREATININE, SERUM
CREATININE: 0.77 mg/dL (ref 0.50–1.10)
GFR calc Af Amer: >90 mL/min (ref >90)
GFR calc non Af Amer: >90 mL/min (ref >90)

## 2013-05-22 LAB — LIPASE, BLOOD: Lipase: 21 U/L (ref 11–59)

## 2013-05-22 SURGERY — LAPAROSCOPIC CHOLECYSTECTOMY WITH INTRAOPERATIVE CHOLANGIOGRAM
Anesthesia: General | Site: Abdomen

## 2013-05-22 MED ORDER — GLYCOPYRROLATE 0.2 MG/ML IJ SOLN
INTRAMUSCULAR | Status: DC | PRN
  Administered 2013-05-22: 0.4 mg via INTRAVENOUS

## 2013-05-22 MED ORDER — POTASSIUM CHLORIDE IN NACL 20-0.9 MEQ/L-% IV SOLN
INTRAVENOUS | Status: DC
  Administered 2013-05-22: 14:00:00 via INTRAVENOUS
  Filled 2013-05-22 (×4): qty 1000

## 2013-05-22 MED ORDER — ROCURONIUM BROMIDE 100 MG/10ML IV SOLN
INTRAVENOUS | Status: DC | PRN
  Administered 2013-05-22: 35 mg via INTRAVENOUS
  Administered 2013-05-22: 5 mg via INTRAVENOUS

## 2013-05-22 MED ORDER — FENTANYL CITRATE 0.05 MG/ML IJ SOLN
INTRAMUSCULAR | Status: AC
  Filled 2013-05-22: qty 5

## 2013-05-22 MED ORDER — ONDANSETRON HCL 4 MG/2ML IJ SOLN
INTRAMUSCULAR | Status: DC | PRN
  Administered 2013-05-22: 4 mg via INTRAVENOUS

## 2013-05-22 MED ORDER — OXYCODONE HCL 5 MG PO TABS: 5.0000 mg | ORAL_TABLET | Freq: Once | ORAL | Status: DC | PRN

## 2013-05-22 MED ORDER — BUPIVACAINE-EPINEPHRINE (PF) 0.25% -1:200000 IJ SOLN
INTRAMUSCULAR | Status: AC
  Filled 2013-05-22: qty 30

## 2013-05-22 MED ORDER — HYDROMORPHONE HCL PF 1 MG/ML IJ SOLN
1.0000 mg | INTRAMUSCULAR | Status: DC | PRN
  Administered 2013-05-22: 0.5 mg via INTRAVENOUS
  Filled 2013-05-22: qty 1

## 2013-05-22 MED ORDER — HYDROMORPHONE HCL PF 1 MG/ML IJ SOLN
0.2500 mg | INTRAMUSCULAR | Status: DC | PRN
  Administered 2013-05-22: 0.5 mg via INTRAVENOUS

## 2013-05-22 MED ORDER — MIDAZOLAM HCL 2 MG/2ML IJ SOLN: 0.5000 mg | Freq: Once | INTRAMUSCULAR | Status: DC | PRN

## 2013-05-22 MED ORDER — ONDANSETRON HCL 4 MG PO TABS: 4.0000 mg | ORAL_TABLET | Freq: Four times a day (QID) | ORAL | Status: DC | PRN

## 2013-05-22 MED ORDER — HYDROMORPHONE HCL PF 1 MG/ML IJ SOLN
INTRAMUSCULAR | Status: AC
  Filled 2013-05-22: qty 1

## 2013-05-22 MED ORDER — OXYCODONE HCL 5 MG/5ML PO SOLN: 5.0000 mg | Freq: Once | ORAL | Status: DC | PRN

## 2013-05-22 MED ORDER — FENTANYL CITRATE 0.05 MG/ML IJ SOLN
INTRAMUSCULAR | Status: DC | PRN
  Administered 2013-05-22: 50 ug via INTRAVENOUS
  Administered 2013-05-22: 100 ug via INTRAVENOUS
  Administered 2013-05-22: 150 ug via INTRAVENOUS
  Administered 2013-05-22: 100 ug via INTRAVENOUS

## 2013-05-22 MED ORDER — BUPIVACAINE-EPINEPHRINE 0.25% -1:200000 IJ SOLN
INTRAMUSCULAR | Status: DC | PRN
  Administered 2013-05-22: 10 mL

## 2013-05-22 MED ORDER — 0.9 % SODIUM CHLORIDE (POUR BTL) OPTIME
TOPICAL | Status: DC | PRN
  Administered 2013-05-22: 1000 mL

## 2013-05-22 MED ORDER — LIDOCAINE HCL (CARDIAC) 20 MG/ML IV SOLN
INTRAVENOUS | Status: AC
  Filled 2013-05-22: qty 5

## 2013-05-22 MED ORDER — MIDAZOLAM HCL 5 MG/5ML IJ SOLN
INTRAMUSCULAR | Status: DC | PRN
  Administered 2013-05-22: 2 mg via INTRAVENOUS

## 2013-05-22 MED ORDER — PROPOFOL 10 MG/ML IV BOLUS
INTRAVENOUS | Status: AC
  Filled 2013-05-22: qty 20

## 2013-05-22 MED ORDER — MEPERIDINE HCL 25 MG/ML IJ SOLN: 6.2500 mg | INTRAMUSCULAR | Status: DC | PRN

## 2013-05-22 MED ORDER — ONDANSETRON HCL 4 MG/2ML IJ SOLN: 4.0000 mg | Freq: Four times a day (QID) | INTRAMUSCULAR | Status: DC | PRN

## 2013-05-22 MED ORDER — ROCURONIUM BROMIDE 50 MG/5ML IV SOLN
INTRAVENOUS | Status: AC
  Filled 2013-05-22: qty 1

## 2013-05-22 MED ORDER — SODIUM CHLORIDE 0.9 % IR SOLN
Status: DC | PRN
  Administered 2013-05-22: 1000 mL

## 2013-05-22 MED ORDER — PROPOFOL 10 MG/ML IV BOLUS
INTRAVENOUS | Status: DC | PRN
  Administered 2013-05-22: 200 mg via INTRAVENOUS

## 2013-05-22 MED ORDER — ENOXAPARIN SODIUM 40 MG/0.4ML ~~LOC~~ SOLN
40.0000 mg | SUBCUTANEOUS | Status: DC
  Administered 2013-05-23: 40 mg via SUBCUTANEOUS
  Filled 2013-05-22 (×2): qty 0.4

## 2013-05-22 MED ORDER — SUCCINYLCHOLINE CHLORIDE 20 MG/ML IJ SOLN
INTRAMUSCULAR | Status: AC
Start: 2013-05-22 — End: 2013-05-22
  Filled 2013-05-22: qty 1

## 2013-05-22 MED ORDER — LIDOCAINE HCL (CARDIAC) 20 MG/ML IV SOLN
INTRAVENOUS | Status: DC | PRN
  Administered 2013-05-22: 25 mg via INTRAVENOUS

## 2013-05-22 MED ORDER — PROMETHAZINE HCL 25 MG/ML IJ SOLN: 6.2500 mg | INTRAMUSCULAR | Status: DC | PRN

## 2013-05-22 MED ORDER — MIDAZOLAM HCL 2 MG/2ML IJ SOLN
INTRAMUSCULAR | Status: AC
  Filled 2013-05-22: qty 2

## 2013-05-22 MED ORDER — NEOSTIGMINE METHYLSULFATE 1 MG/ML IJ SOLN
INTRAMUSCULAR | Status: DC | PRN
  Administered 2013-05-22: 3 mg via INTRAVENOUS

## 2013-05-22 MED ORDER — HYDROCODONE-ACETAMINOPHEN 5-325 MG PO TABS
1.0000 | ORAL_TABLET | ORAL | Status: DC | PRN
  Administered 2013-05-22 – 2013-05-23 (×3): 2 via ORAL
  Filled 2013-05-22 (×3): qty 2

## 2013-05-22 MED ORDER — LACTATED RINGERS IV SOLN
INTRAVENOUS | Status: DC
  Administered 2013-05-22 (×2): via INTRAVENOUS

## 2013-05-22 MED ORDER — WHITE PETROLATUM GEL
Status: AC
  Administered 2013-05-22: 14:00:00
  Filled 2013-05-22: qty 5

## 2013-05-22 SURGICAL SUPPLY — 44 items
APPLIER CLIP ROT 10 11.4 M/L (STAPLE) ×3
BLADE SURG ROTATE 9660 (MISCELLANEOUS) IMPLANT
CANISTER SUCTION 2500CC (MISCELLANEOUS) ×3 IMPLANT
CHLORAPREP W/TINT 26ML (MISCELLANEOUS) ×3 IMPLANT
CLIP APPLIE ROT 10 11.4 M/L (STAPLE) ×1 IMPLANT
COVER MAYO STAND STRL (DRAPES) ×3 IMPLANT
COVER SURGICAL LIGHT HANDLE (MISCELLANEOUS) ×3 IMPLANT
DECANTER SPIKE VIAL GLASS SM (MISCELLANEOUS) ×6 IMPLANT
DERMABOND ADHESIVE PROPEN (GAUZE/BANDAGES/DRESSINGS) ×2
DERMABOND ADVANCED (GAUZE/BANDAGES/DRESSINGS) ×2
DERMABOND ADVANCED .7 DNX12 (GAUZE/BANDAGES/DRESSINGS) ×1 IMPLANT
DERMABOND ADVANCED .7 DNX6 (GAUZE/BANDAGES/DRESSINGS) ×1 IMPLANT
DRAPE C-ARM 42X72 X-RAY (DRAPES) ×3 IMPLANT
DRAPE UTILITY 15X26 W/TAPE STR (DRAPE) ×6 IMPLANT
ELECT REM PT RETURN 9FT ADLT (ELECTROSURGICAL) ×3
ELECTRODE REM PT RTRN 9FT ADLT (ELECTROSURGICAL) ×1 IMPLANT
GLOVE BIOGEL PI IND STRL 6.5 (GLOVE) ×1 IMPLANT
GLOVE BIOGEL PI IND STRL 7.5 (GLOVE) ×2 IMPLANT
GLOVE BIOGEL PI INDICATOR 6.5 (GLOVE) ×2
GLOVE BIOGEL PI INDICATOR 7.5 (GLOVE) ×4
GLOVE EUDERMIC 7 POWDERFREE (GLOVE) ×3 IMPLANT
GOWN STRL REUS W/ TWL LRG LVL3 (GOWN DISPOSABLE) ×3 IMPLANT
GOWN STRL REUS W/ TWL XL LVL3 (GOWN DISPOSABLE) ×1 IMPLANT
GOWN STRL REUS W/TWL LRG LVL3 (GOWN DISPOSABLE) ×6
GOWN STRL REUS W/TWL XL LVL3 (GOWN DISPOSABLE) ×2
KIT BASIN OR (CUSTOM PROCEDURE TRAY) ×3 IMPLANT
KIT ROOM TURNOVER OR (KITS) ×3 IMPLANT
NS IRRIG 1000ML POUR BTL (IV SOLUTION) ×3 IMPLANT
PAD ARMBOARD 7.5X6 YLW CONV (MISCELLANEOUS) ×3 IMPLANT
POUCH RETRIEVAL ECOSAC 10 (ENDOMECHANICALS) ×1 IMPLANT
POUCH RETRIEVAL ECOSAC 10MM (ENDOMECHANICALS) ×2
POUCH SPECIMEN RETRIEVAL 10MM (ENDOMECHANICALS) ×3 IMPLANT
SCISSORS LAP 5X35 DISP (ENDOMECHANICALS) ×3 IMPLANT
SET CHOLANGIOGRAPH 5 50 .035 (SET/KITS/TRAYS/PACK) ×3 IMPLANT
SET IRRIG TUBING LAPAROSCOPIC (IRRIGATION / IRRIGATOR) ×3 IMPLANT
SLEEVE ENDOPATH XCEL 5M (ENDOMECHANICALS) ×3 IMPLANT
SPECIMEN JAR SMALL (MISCELLANEOUS) ×3 IMPLANT
SUT MNCRL AB 4-0 PS2 18 (SUTURE) ×3 IMPLANT
TOWEL OR 17X24 6PK STRL BLUE (TOWEL DISPOSABLE) ×3 IMPLANT
TOWEL OR 17X26 10 PK STRL BLUE (TOWEL DISPOSABLE) ×3 IMPLANT
TRAY LAPAROSCOPIC (CUSTOM PROCEDURE TRAY) ×3 IMPLANT
TROCAR XCEL BLUNT TIP 100MML (ENDOMECHANICALS) ×3 IMPLANT
TROCAR XCEL NON-BLD 11X100MML (ENDOMECHANICALS) ×3 IMPLANT
TROCAR XCEL NON-BLD 5MMX100MML (ENDOMECHANICALS) ×3 IMPLANT

## 2013-05-22 NOTE — Op Note (Signed)
Patient Name:           Sydney [name]   Date of Surgery:        05/22/2013  Pre op Diagnosis:      Biliary pancreatitis Chronic cholecystitis with cholelithiasis 6 weeks postpartum  Post op Diagnosis:    Same  Procedure:                 Laparoscopic cholecystectomy  Surgeon:                     Angelia MouldHaywood M. Derrell LollingIngram, M.D., FACS  Assistant:                      Chevis PrettyPaul Toth, M.D.  Operative Indications:   Sydney [name] is a healthy 18 year old female 1 month post partum admitted 05/20/2013 with abdominal pain. Duration of symptoms is 2 years. Symptoms were intermittent, typically after she eats subway or high fat foods. [facility] and found to have abnormal LTFs and gallstones. She was asked to wait until after delivery in March and was seen by Dr. Derrell Lollingamirez on the 26th of March. She was scheduled for a laparoscopic cholecystectomy with IOC on the 15th. Her pain worsened on 05/19/2913  and became more constant. Associated with nausea and vomiting. She denies fever, chills or sweats. Denies melena or hematochezia. Last oral intake was yesterday at 3PM.  Aggravated by food. No alleviating factors. Location of pain is band like in the epigastric region. Severe in severity. Labs today reveal elevated LFTs with bilirubin of 2.1. Lipase >3000. Normal white count. US showed a 10mm CBD, gallbladder thickening. GI is on board. She was admitted and treated for pancreatitis which subsided. Yesterday, she underwent ERCP and sphincterotomy by Dr. Jeani HawkingPatrick Hung. He did not find a common duct stone and the occlusion cholangiogram was normal. This morning she was feeling fine and we offered to proceed with cholecystectomy today, which she agreed to.   Operative Findings:       The gallbladder was slightly thickwalled and slightly edematous but not severely inflamed. There were extensive omental adhesions to the body and infundibulum of the gallbladder. She had an extremely long chronically thickened cystic  duct which had some stones in it that we milked back up into the gallbladder. The liver appeared healthy. The stomach and duodenum stomach, small intestine and large intestine were grossly normal to inspection. We chose not to do a cholangiogram since the anatomy was clear and she had had an ERCP and cholangiogram yesterday.  Procedure in Detail:          Following the induction of general endotracheal anesthesia the patient's abdomen was prepped and draped in a sterile fashion. Another dose of antibiotics were given. Surgical time out was performed. 0.5% Marcaine with epinephrine was used as a local infiltration anesthetic. A vertical incision was made in the lower rim of the umbilicus. The fascia was incised in the midline and the abdominal cavity entered under direct vision. An 11 mm Hassan trocar was inserted and secured with a  pursestring suture of 0 Vicryl. Pneumoperitoneum was created and videocamera was inserted. 11 mm trocar was placed in the subxiphoid region and two 5 mm trocars placed in the right upper quadrant. The gallbladder fundus was identified, grasped and elevated. We spent some time taking down the adhesions off of the gallbladder. We isolated the cystic artery as it went onto the wall of the gallbladder,  secured it with metal clips and divided it.   This facilitated a fairly extensive dissection of the back wall of the gallbladder and a very long, thick  cystic duct. This was an excellent critical view. There were a few small stones in the upper cystic duct which were milked back up into the gallbladder. We secured the cystic duct with 3 metal clips and then divided it a safe area. The gallbladder was dissected from its bed with electrocautery placed in a specimen bag and removed. The operative field was copiously irrigated. There was no bleeding and no bile leak. Irrigation fluid became completely clear and was removed. The trocars were removed and there was no bleeding from the trocar  sites. The pneumoperitoneum was released. The fascia at the umbilicus was closed with 0 Vicryl sutures. The skin incisions were closed with subcuticular sutures of 4-0 Monocryl and Dermabond. The patient tolerated the procedure very well was taken to PACU in stable condition. EBL 10-20 cc. Counts correct. Complications none.     Angelia Mould. Derrell Lolling, M.D., FACS General and Minimally Invasive Surgery Breast and Colorectal Surgery  05/22/2013 12:05 PM

## 2013-05-22 NOTE — Anesthesia Preprocedure Evaluation (Addendum)
Anesthesia Evaluation  Patient identified by MRN, date of birth, ID band Patient awake    Reviewed: Allergy & Precautions, H&P , NPO status , Patient's Chart, lab work & pertinent test results  History of Anesthesia Complications Negative for: history of anesthetic complications  Airway Mallampati: II TM Distance: >3 FB Neck ROM: Full    Dental  (+) Teeth Intact, Dental Advisory Given   Pulmonary former smoker (quit '14),  breath sounds clear to auscultation  Pulmonary exam normal       Cardiovascular negative cardio ROS  Rhythm:Regular Rate:Normal     Neuro/Psych    GI/Hepatic Elevated LFTs with acute cholecystitis Pancreatitis with cholelithiasis   Endo/Other  Morbid obesity  Renal/GU negative Renal ROS     Musculoskeletal   Abdominal (+) + obese,   Peds  Hematology negative hematology ROS (+)   Anesthesia Other Findings   Reproductive/Obstetrics 6 weeks post-partum, not nursing 05/20/13 preg test: neg                         Anesthesia Physical Anesthesia Plan  ASA: II  Anesthesia Plan: General   Post-op Pain Management:    Induction: Intravenous, Rapid sequence and Cricoid pressure planned  Airway Management Planned: Oral ETT  Additional Equipment:   Intra-op Plan:   Post-operative Plan: Extubation in OR  Informed Consent: I have reviewed the patients History and Physical, chart, labs and discussed the procedure including the risks, benefits and alternatives for the proposed anesthesia with the patient or authorized representative who has indicated his/her understanding and acceptance.   Dental advisory given  Plan Discussed with: CRNA, Surgeon and Anesthesiologist  Anesthesia Plan Comments: (Plan routine monitors, GETA)       Anesthesia Quick Evaluation

## 2013-05-22 NOTE — Anesthesia Postprocedure Evaluation (Signed)
  Anesthesia Post-op Note  Patient: Sydney [name]  Procedure(s) Performed: Procedure(s): LAPAROSCOPIC CHOLECYSTECTOMY WITH INTRAOPERATIVE CHOLANGIOGRAM (N/A)  Patient Location: PACU  Anesthesia Type:General  Level of Consciousness: awake, alert , oriented and patient cooperative  Airway and Oxygen Therapy: Patient Spontanous Breathing  Post-op Pain: mild  Post-op Assessment: Post-op Vital signs reviewed, Patient's Cardiovascular Status Stable, Respiratory Function Stable, Patent Airway, No signs of Nausea or vomiting and Pain level controlled  Post-op Vital Signs: Reviewed and stable  Last Vitals:  Filed Vitals:   05/22/13 1245  BP:   Pulse: 79  Temp:   Resp: 18    Complications: No apparent anesthesia complications

## 2013-05-22 NOTE — Anesthesia Procedure Notes (Signed)
Procedure Name: Intubation Date/Time: 05/22/2013 10:59 AM Performed by: Charm BargesBUTLER, Jayvian Escoe R Pre-anesthesia Checklist: Patient identified, Emergency Drugs available, Suction available, Patient being monitored and Timeout performed Patient Re-evaluated:Patient Re-evaluated prior to inductionOxygen Delivery Method: Circle system utilized Preoxygenation: Pre-oxygenation with 100% oxygen Intubation Type: IV induction and Cricoid Pressure applied Ventilation: Mask ventilation without difficulty Laryngoscope Size: Mac and 3 Grade View: Grade I Tube type: Oral Tube size: 7.5 mm Number of attempts: 1 Airway Equipment and Method: Stylet Placement Confirmation: ETT inserted through vocal cords under direct vision,  positive ETCO2 and breath sounds checked- equal and bilateral Secured at: 21 cm Tube secured with: Tape Dental Injury: Teeth and Oropharynx as per pre-operative assessment

## 2013-05-22 NOTE — Progress Notes (Signed)
[facility] Progress Note [place] TEAM 1 - Stepdown/ICU TEAM   Sydney [name] UJW:[id number] DOB: 02-21-1995 DOA: 05/20/2013 PCP: Bennie PieriniMARTIN,MARY MARGARET, FNP  Brief narrative: Sydney [name] is a 18 y.o. female presenting on 05/19/2013 who about 4 wks postpartum and presents with upper abdominal pain and found to have choledocholithiasis and acute pancreatitis   Subjective: Pain at surgical site  Assessment/Plan: Principal Problem:   Choledocholithiasis with cholecystitis  - ERCP done but no obstruction seen- likely passed the stone - management per surgery  Active Problems:   Acute gallstone pancreatitis - resolved     Code Status: Full code Family Communication: with parents Disposition Plan: per surgical service  Consultants: Gi Surgery  Procedures: none  Antibiotics: Anti-infectives   Start     Dose/Rate Route Frequency Ordered Stop   05/20/13 1500  [MAR Hold]  piperacillin-tazobactam (ZOSYN) IVPB 3.375 g  Status:  Discontinued     (On MAR Hold since 05/22/13 0945)   3.375 g 12.5 mL/hr over 240 Minutes Intravenous 3 times per day 05/20/13 1113 05/22/13 1331   05/20/13 0900  Ampicillin-Sulbactam (UNASYN) 3 g in sodium chloride 0.9 % 100 mL IVPB  Status:  Discontinued     3 g 100 mL/hr over 60 Minutes Intravenous Every 6 hours 05/20/13 0806 05/20/13 1105   05/20/13 0615  cefoTAXime (CLAFORAN) 1 g in dextrose 5 % 50 mL IVPB     1 g 100 mL/hr over 30 Minutes Intravenous  Once 05/20/13 0611 05/20/13 0720        DVT prophylaxis: SCDs  Objective: Filed Weights   05/19/13 2209 05/20/13 1702  Weight: 84.823 kg (187 lb) 84.823 kg (187 lb)   Blood pressure 135/80, pulse 77, temperature 98.1 F (36.7 C), temperature source Oral, resp. rate 16, height 5\' 4"  (1.626 m), weight 84.823 kg (187 lb), SpO2 100.00%, not currently breastfeeding.  Intake/Output Summary (Last 24 hours) at 05/22/13 1706 Last data filed at 05/22/13 1500  Gross per 24 hour  Intake  3355.42 ml  Output      0 ml  Net 3355.42 ml     Exam: General: No acute respiratory distress Lungs: Clear to auscultation bilaterally without wheezes or crackles Cardiovascular: Regular rate and rhythm without murmur gallop or rub normal S1 and S2 Abdomen: mild tenderness in LUQ, nondistended, soft, bowel sounds positive, no rebound, no ascites, no appreciable mass Extremities: No significant cyanosis, clubbing, or edema bilateral lower extremities  Data Reviewed: Basic Metabolic Panel:  Recent Labs Lab 05/19/13 2225 05/20/13 1157 05/21/13 0500 05/22/13 0500  NA 143 146 142 140  K 4.1 3.7 3.4* 3.8  CL 101 109 104 102  CO2 25 23 23 22   GLUCOSE 110* 91 74 82  BUN 8 6 6  5*  CREATININE 0.81 0.82 0.93 0.76  CALCIUM 10.0 8.7 9.1 9.4   Liver Function Tests:  Recent Labs Lab 05/19/13 2225 05/21/13 0500 05/22/13 0500  AST 318* 179* 73*  ALT 268* 286* 187*  ALKPHOS 179* 209* 190*  BILITOT 2.1* 1.0 0.7  PROT 8.2 6.5 6.8  ALBUMIN 4.2 3.1* 3.3*    Recent Labs Lab 05/20/13 0617 05/21/13 0500 05/22/13 0500  LIPASE >3000* 124* 21   No results found for this basename: AMMONIA,  in the last 168 hours CBC:  Recent Labs Lab 05/19/13 2225 05/20/13 1157 05/21/13 0500  WBC 6.8 5.2 5.5  NEUTROABS 4.9 2.9  --   HGB 13.9 12.0 11.8*  HCT 41.7 36.6 36.2  MCV 81.3 81.9 82.6  PLT  238 179 187   Cardiac Enzymes: No results found for this basename: CKTOTAL, CKMB, CKMBINDEX, TROPONINI,  in the last 168 hours BNP (last 3 results) No results found for this basename: PROBNP,  in the last 8760 hours CBG:  Recent Labs Lab 05/21/13 0743 05/21/13 1157 05/22/13 0826 05/22/13 1358 05/22/13 1608  GLUCAP 78 66* 76 74 73    Recent Results (from the past 240 hour(s))  SURGICAL PCR SCREEN     Status: None   Collection Time    05/20/13  8:19 PM      Result Value Ref Range Status   MRSA, PCR NEGATIVE  NEGATIVE Final   Staphylococcus aureus NEGATIVE  NEGATIVE Final   Comment:             The Xpert SA Assay (FDA     approved for NASAL specimens     in patients over 72 years of age),     is one component of     a comprehensive surveillance     program.  Test performance has     been validated by [employer] for patients greater     than or equal to 63 year old.     It is not intended     to diagnose infection nor to     guide or monitor treatment.     Studies:  Recent x-ray studies have been reviewed in detail by the Attending Physician  Scheduled Meds:  Scheduled Meds: . [START ON 05/23/2013] enoxaparin (LOVENOX) injection  40 mg Subcutaneous Q24H  . HYDROmorphone       Continuous Infusions: . 0.9 % NaCl with KCl 20 mEq / L 100 mL/hr at 05/22/13 1426    Time spent on care of this patient: 25 min   Calvert Cantor, MD 05/22/2013, 5:06 PM  LOS: 2 days   [facility] Office  [phone] Pager - Text Page per Loretha Stapler   If 7PM-7AM, please contact night-coverage [email or web address]

## 2013-05-22 NOTE — Transfer of Care (Signed)
Immediate Anesthesia Transfer of Care Note  Patient: Sydney [name]  Procedure(s) Performed: Procedure(s): LAPAROSCOPIC CHOLECYSTECTOMY WITH INTRAOPERATIVE CHOLANGIOGRAM (N/A)  Patient Location: PACU  Anesthesia Type:General  Level of Consciousness: awake  Airway & Oxygen Therapy: Patient Spontanous Breathing and Patient connected to nasal cannula oxygen  Post-op Assessment: Report given to PACU RN, Post -op Vital signs reviewed and stable and Patient moving all extremities  Post vital signs: Reviewed and stable  Complications: No apparent anesthesia complications

## 2013-05-22 NOTE — Progress Notes (Signed)
1 Day Post-Op  Subjective: The patient is alert and feeling fairly well today. Almost no pain.  ERCP performed yesterday by Dr. Marcelle OverlieHolland. Findings are Mildly dilated CBD, but no evidence of CBD stones. 1 cm sphincterotomy created. Occlusion cholangiogram negative. No apparent postop complications.   Objective: Vital signs in last 24 hours: Temp:  [97.7 F (36.5 C)-98.4 F (36.9 C)] 97.7 F (36.5 C) (04/11 0610) Pulse Rate:  [59-100] 59 (04/11 0610) Resp:  [15-22] 16 (04/11 0610) BP: (94-129)/(46-85) 94/48 mmHg (04/11 0610) SpO2:  [98 %-100 %] 100 % (04/11 0610) Last BM Date: 05/19/13  Intake/Output from previous day: 04/10 0701 - 04/11 0700 In: 2073.8 [P.O.:100; I.V.:1723.8; IV Piggyback:250] Out: -  Intake/Output this shift:    General appearance: alert and cooperative. Mental status normal. No distress Resp: clear to auscultation bilaterally GI: abdomen soft. Nondistended. Essentially nontender.  Lab Results:   Recent Labs  05/20/13 1157 05/21/13 0500  WBC 5.2 5.5  HGB 12.0 11.8*  HCT 36.6 36.2  PLT 179 187   BMET  Recent Labs  05/20/13 1157 05/21/13 0500  NA 146 142  K 3.7 3.4*  CL 109 104  CO2 23 23  GLUCOSE 91 74  BUN 6 6  CREATININE 0.82 0.93  CALCIUM 8.7 9.1   PT/INR  Recent Labs  05/20/13 1157  LABPROT 13.7  INR 1.07   ABG No results found for this basename: PHART, PCO2, PO2, HCO3,  in the last 72 hours  Studies/Results: Dg Ercp Biliary & Pancreatic Ducts  05/21/2013   CLINICAL DATA:  CBD stones  EXAM: ERCP  TECHNIQUE: Multiple spot images obtained with the fluoroscopic device and submitted for interpretation post-procedure.  COMPARISON:  US ABDOMEN LIMITED dated 03/15/2013  FINDINGS: Four spot intraoperative fluoroscopic images of the right upper quadrant during ERCP are provided for review.  Initial image demonstrates an ERCP probe overlying the right upper abdominal quadrant. There is selective cannulation and opacification of the common  bile duct which appears nondilated. There are no discrete filling defects within the opacified portions of the biliary tree.  There is mild dilatation of the central aspect of the intrahepatic biliary system which appears nondilated.  There is minimal opacification of the cystic duct and gallbladder. There is no definitive opacification of the pancreatic duct.  Subsequent image demonstrates insufflation of a biliary sphincterotomy balloon within the distal aspect of the CBD (image 2).  Completion image (image 4) demonstrates passage of contrast into the descending portion of the duodenum.  IMPRESSION: ERCP with biliary sweeping and presumed sphincterotomy as above.  These images were submitted for radiologic interpretation only. Please see the procedural report for the amount of contrast and the fluoroscopy time utilized.   Electronically Signed   By: Simonne ComeJohn  Watts M.D.   On: 05/21/2013 12:22    Anti-infectives: Anti-infectives   Start     Dose/Rate Route Frequency Ordered Stop   05/20/13 1500  piperacillin-tazobactam (ZOSYN) IVPB 3.375 g     3.375 g 12.5 mL/hr over 240 Minutes Intravenous 3 times per day 05/20/13 1113     05/20/13 0900  Ampicillin-Sulbactam (UNASYN) 3 g in sodium chloride 0.9 % 100 mL IVPB  Status:  Discontinued     3 g 100 mL/hr over 60 Minutes Intravenous Every 6 hours 05/20/13 0806 05/20/13 1105   05/20/13 0615  cefoTAXime (CLAFORAN) 1 g in dextrose 5 % 50 mL IVPB     1 g 100 mL/hr over 30 Minutes Intravenous  Once 05/20/13 0611 05/20/13 0720  Assessment/Plan: s/p Procedure(s): ENDOSCOPIC RETROGRADE CHOLANGIOPANCREATOGRAPHY (ERCP)  Chronic cholecystitis with cholelithiasis, possible low-grade subacute cholecystitis. Plan laparoscopic cholecystectomy with cholangiogram, possible open today  Biliary pancreatitis. ERCP negative, so presume she passed a CBD stone. Recovery uneventfully following ERCP yesterday.  I discussed the indications, details, techniques, and  numerous risks of cholecystectomy with her and her family. She is aware of the risk of bleeding, infection, conversion to open laparotomy, bile leak and its consequences, injury to adjacent organs with major reconstructive surgery, wound healing problems such as hernia, cardiac, pulmonary and thromboembolic problems. She understands all these issues well. All of herquestions are answered. She agrees with this plan.   LOS: 2 days    Ernestene Mention 05/22/2013

## 2013-05-22 NOTE — Progress Notes (Signed)
Report called to Molly Maduroobert, RN in short stay. Patient going to operating room.

## 2013-05-23 LAB — COMPREHENSIVE METABOLIC PANEL
ALT: 121 U/L — ABNORMAL HIGH (ref 0–35)
AST: 35 U/L (ref 0–37)
Albumin: 3.1 g/dL — ABNORMAL LOW (ref 3.5–5.2)
Alkaline Phosphatase: 150 U/L — ABNORMAL HIGH (ref 39–117)
BILIRUBIN TOTAL: 0.4 mg/dL (ref 0.3–1.2)
BUN: 4 mg/dL — ABNORMAL LOW (ref 6–23)
CHLORIDE: 104 meq/L (ref 96–112)
CO2: 24 meq/L (ref 19–32)
CREATININE: 0.86 mg/dL (ref 0.50–1.10)
Calcium: 8.9 mg/dL (ref 8.4–10.5)
GFR calc Af Amer: >90 mL/min (ref >90)
GLUCOSE: 81 mg/dL (ref 70–99)
Potassium: 4 mEq/L (ref 3.7–5.3)
Sodium: 142 mEq/L (ref 137–147)
Total Protein: 6.1 g/dL (ref 6.0–8.3)

## 2013-05-23 LAB — GLUCOSE, CAPILLARY
Glucose-Capillary: 86 mg/dL (ref 70–99)
Glucose-Capillary: 93 mg/dL (ref 70–99)
Glucose-Capillary: 97 mg/dL (ref 70–99)

## 2013-05-23 MED ORDER — HYDROCODONE-ACETAMINOPHEN 5-325 MG PO TABS: 1.0000 | ORAL_TABLET | Freq: Four times a day (QID) | ORAL | Status: DC | PRN

## 2013-05-23 NOTE — Progress Notes (Signed)
Discharge instructions gone over with patient. Prescription given. Home medications gone over. Follow up appointment is made. Diet, activity, incisional care, and signs and symptoms of infection gone over. Reasons to call the doctor discussed. My chart discussed. Patient verbalized understanding of instructions.

## 2013-05-23 NOTE — Discharge Summary (Signed)
  Patient ID: Sydney [name] [id number] 17 y.o. [birth year]   Admit date: 05/20/2013  Discharge date and time: 05/23/2013  Admitting Physician: Sydney [name], Sydney [name]  Discharge Physician: Sydney [name]  Admission Diagnoses: Choledocholithiasis with cholecystitis [574.40]  Discharge Diagnoses: Same  Operations: Procedure(s): LAPAROSCOPIC CHOLECYSTECTOMY WITH INTRAOPERATIVE CHOLANGIOGRAM  Admission Condition: fair  Discharged Condition: good  Indication for Admission: Sydney [name] is a healthy 18 year old female 1 month post partum admitted 05/20/2013 with abdominal pain. Duration of symptoms is 2 years. Symptoms were intermittent, typically after she eats subway or high fat foods. [facility] and found to have abnormal LTFs and gallstones. She was asked to wait until after delivery in March and was seen by Dr. Derrell [name] on the 26th of March. She was scheduled for a laparoscopic cholecystectomy with IOC on the 15th. Her pain worsened on 05/19/2913 and became more constant. Associated with nausea and vomiting. She denies fever, chills or sweats. Denies melena or hematochezia. Aggravated by food. No alleviating factors. Location of pain is band like in the epigastric region. Severe in severity. Labs in ED  reveal elevated LFTs with bilirubin of 2.1. Lipase >3000. Normal white count. US showed a 10mm CBD, gallbladder thickening.   Hospital Course: She was admitted and treated for pancreatitis which subsided. , she underwent ERCP and sphincterotomy by Dr. Jeani HawkingPatrick [name]. He did not find a common duct stone and the occlusion cholangiogram was normal. She was stable 24 hours later and was taken to the operating room and underwent a laparoscopic cholecystectomy. She was observed overnight and did very well with progression in diet and activities uneventfully.The morning after surgery her liver function tests were improved with alkaline phosphatase of 150, ALT of 121 and otherwise her liver  function tests were normal. Examination the day of discharge revealed her abdomen is soft nondistended and all of the wounds were healing uneventfully. She was tolerating a diet, and ambulating independently and voiding without difficulty. She was instructed in diet and activities and followup with us. She was given a prescription for Norco for pain.  Consults: GI  Significant Diagnostic Studies: Ultrasound. ERCP. Intraoperative cholangiogram. Laboratory testing.  Treatments: ERCP with sphincterotomy, laparoscopic cholecystectomy.  Disposition: Home  Patient Instructions:    Medication List         acetaminophen 500 MG tablet  Commonly known as:  TYLENOL  Take 1,000 mg by mouth every 6 (six) hours as needed for moderate pain.     HYDROcodone-acetaminophen 5-325 MG per tablet  Commonly known as:  NORCO  Take 1-2 tablets by mouth every 6 (six) hours as needed.     ibuprofen 600 MG tablet  Commonly known as:  ADVIL,MOTRIN  Take 1 tablet (600 mg total) by mouth every 6 (six) hours.     ondansetron 8 MG disintegrating tablet  Commonly known as:  ZOFRAN ODT  Take 1 tablet (8 mg total) by mouth every 8 (eight) hours as needed for nausea.        Activity: no heavy lifting for 2-3 weeks Diet: low fat, low cholesterol diet Wound Care: none needed  Follow-up:  With [facility] in 3 weeks.  Signed: Angelia MouldHaywood M. Sydney [name], M.D., FACS General and minimally invasive surgery Breast and Colorectal Surgery  05/23/2013, 9:53 AM

## 2013-05-23 NOTE — Discharge Instructions (Signed)
-  see above 

## 2013-05-24 ENCOUNTER — Encounter (HOSPITAL_COMMUNITY): Payer: Self-pay | Admitting: Gastroenterology

## 2013-05-25 ENCOUNTER — Ambulatory Visit: Payer: BC Managed Care – PPO | Admitting: Obstetrics & Gynecology

## 2013-06-08 ENCOUNTER — Ambulatory Visit: Payer: BC Managed Care – PPO | Admitting: Obstetrics & Gynecology

## 2013-06-14 ENCOUNTER — Encounter (INDEPENDENT_AMBULATORY_CARE_PROVIDER_SITE_OTHER): Payer: BC Managed Care – PPO | Admitting: General Surgery

## 2013-06-14 ENCOUNTER — Ambulatory Visit: Payer: BC Managed Care – PPO | Admitting: Obstetrics & Gynecology

## 2013-06-18 ENCOUNTER — Encounter: Payer: Self-pay | Admitting: Nurse Practitioner

## 2013-06-21 ENCOUNTER — Encounter (INDEPENDENT_AMBULATORY_CARE_PROVIDER_SITE_OTHER): Payer: Self-pay | Admitting: General Surgery

## 2013-07-30 NOTE — Telephone Encounter (Signed)
done

## 2013-08-24 ENCOUNTER — Ambulatory Visit (INDEPENDENT_AMBULATORY_CARE_PROVIDER_SITE_OTHER): Payer: BC Managed Care – PPO | Admitting: *Deleted

## 2013-08-24 ENCOUNTER — Encounter: Payer: Self-pay | Admitting: *Deleted

## 2013-08-24 DIAGNOSIS — Z111 Encounter for screening for respiratory tuberculosis: Secondary | ICD-10-CM

## 2013-08-24 NOTE — Patient Instructions (Signed)

## 2013-08-26 ENCOUNTER — Encounter: Payer: Self-pay | Admitting: *Deleted

## 2013-08-26 LAB — TB SKIN TEST
INDURATION: 0 mm
TB SKIN TEST: NEGATIVE

## 2013-09-03 ENCOUNTER — Ambulatory Visit: Payer: BC Managed Care – PPO

## 2013-09-06 ENCOUNTER — Ambulatory Visit (INDEPENDENT_AMBULATORY_CARE_PROVIDER_SITE_OTHER): Payer: BC Managed Care – PPO

## 2013-09-06 DIAGNOSIS — Z021 Encounter for pre-employment examination: Secondary | ICD-10-CM

## 2013-09-06 DIAGNOSIS — Z111 Encounter for screening for respiratory tuberculosis: Secondary | ICD-10-CM

## 2013-09-07 ENCOUNTER — Ambulatory Visit: Payer: BC Managed Care – PPO

## 2013-09-08 LAB — TB SKIN TEST
Induration: 0 mm
TB SKIN TEST: NEGATIVE

## 2013-12-13 ENCOUNTER — Encounter (HOSPITAL_COMMUNITY): Payer: Self-pay | Admitting: Gastroenterology

## 2013-12-28 ENCOUNTER — Ambulatory Visit (INDEPENDENT_AMBULATORY_CARE_PROVIDER_SITE_OTHER): Payer: BC Managed Care – PPO | Admitting: Family Medicine

## 2013-12-28 ENCOUNTER — Telehealth: Payer: Self-pay | Admitting: Family Medicine

## 2013-12-28 VITALS — BP 119/74 | HR 96 | Temp 97.4°F | Wt 193.4 lb

## 2013-12-28 DIAGNOSIS — R509 Fever, unspecified: Secondary | ICD-10-CM

## 2013-12-28 DIAGNOSIS — J029 Acute pharyngitis, unspecified: Secondary | ICD-10-CM

## 2013-12-28 DIAGNOSIS — J020 Streptococcal pharyngitis: Secondary | ICD-10-CM

## 2013-12-28 DIAGNOSIS — R52 Pain, unspecified: Secondary | ICD-10-CM

## 2013-12-28 LAB — POCT RAPID STREP A (OFFICE): Rapid Strep A Screen: NEGATIVE

## 2013-12-28 LAB — POCT INFLUENZA A/B
Influenza A, POC: NEGATIVE
Influenza B, POC: NEGATIVE

## 2013-12-28 MED ORDER — AMOXICILLIN 875 MG PO TABS: 875.0000 mg | ORAL_TABLET | Freq: Two times a day (BID) | ORAL | Status: DC

## 2013-12-28 NOTE — Progress Notes (Signed)
   Subjective:    Patient ID: Sydney [name], female    DOB: January 24, 1996, 18 y.o.   MRN: [id number]  HPI Patient is c/o cough and uri sx's.  Review of Systems  Constitutional: Negative for fever.  HENT: Negative for ear pain.   Eyes: Negative for discharge.  Respiratory: Negative for cough.   Cardiovascular: Negative for chest pain.  Gastrointestinal: Negative for abdominal distention.  Endocrine: Negative for polyuria.  Genitourinary: Negative for difficulty urinating.  Musculoskeletal: Negative for gait problem and neck pain.  Skin: Negative for color change and rash.  Neurological: Negative for speech difficulty and headaches.  Psychiatric/Behavioral: Negative for agitation.       Objective:    BP 119/74 mmHg  Pulse 96  Temp(Src) 97.4 F (36.3 C) (Oral)  Wt 193 lb 6.4 oz (87.726 kg)   Physical Exam  Constitutional: She is oriented to person, place, and time. She appears well-developed and well-nourished.  HENT:  Head: Normocephalic and atraumatic.  Mouth/Throat: Oropharyngeal exudate present.  Eyes: Pupils are equal, round, and reactive to light.  Neck: Normal range of motion. Neck supple.  Cardiovascular: Normal rate and regular rhythm.   No murmur heard. Pulmonary/Chest: Effort normal and breath sounds normal.  Abdominal: Soft. Bowel sounds are normal. There is no tenderness.  Lymphadenopathy:    She has cervical adenopathy.  Neurological: She is alert and oriented to person, place, and time.  Skin: Skin is warm and dry.  Psychiatric: She has a normal mood and affect.          Assessment & Plan:     ICD-9-CM ICD-10-CM   1. Fever and chills 780.60 R50.9 POCT rapid strep A     POCT Influenza A/B     amoxicillin (AMOXIL) 875 MG tablet  2. Sore throat 462 J02.9 POCT rapid strep A     POCT Influenza A/B     amoxicillin (AMOXIL) 875 MG tablet  3. Body aches 780.96 R52 POCT rapid strep A     POCT Influenza A/B     amoxicillin (AMOXIL) 875 MG tablet  4.  Acute streptococcal pharyngitis 034.0 J02.0 amoxicillin (AMOXIL) 875 MG tablet   Push po fluids, rest, tylenol and motrin otc prn as directed for fever, arthralgias, and myalgias.  Follow up prn if sx's continue or persist.  Return if symptoms worsen or fail to improve.  Deatra CanterWilliam J Oxford FNP

## 2013-12-28 NOTE — Telephone Encounter (Signed)
Pt given appt today @ 12:30 with bill oxford

## 2014-01-13 ENCOUNTER — Telehealth: Payer: Self-pay | Admitting: Family Medicine

## 2014-01-14 NOTE — Telephone Encounter (Signed)
Stp advised she would ntbs for evaluation before we could give her any type of medication. Pt states she took some OTC medication and she is feeling better so she doesn't think she ntbs, will close call.

## 2014-02-07 ENCOUNTER — Ambulatory Visit (INDEPENDENT_AMBULATORY_CARE_PROVIDER_SITE_OTHER): Payer: BC Managed Care – PPO | Admitting: Family Medicine

## 2014-02-07 ENCOUNTER — Encounter: Payer: Self-pay | Admitting: Family Medicine

## 2014-02-07 VITALS — BP 117/71 | HR 93 | Temp 97.7°F | Ht 64.0 in | Wt 196.2 lb

## 2014-02-07 DIAGNOSIS — J069 Acute upper respiratory infection, unspecified: Secondary | ICD-10-CM

## 2014-02-07 DIAGNOSIS — J029 Acute pharyngitis, unspecified: Secondary | ICD-10-CM

## 2014-02-07 DIAGNOSIS — R52 Pain, unspecified: Secondary | ICD-10-CM

## 2014-02-07 LAB — POCT RAPID STREP A (OFFICE): Rapid Strep A Screen: NEGATIVE

## 2014-02-07 LAB — POCT INFLUENZA A/B
Influenza A, POC: NEGATIVE
Influenza B, POC: NEGATIVE

## 2014-02-07 MED ORDER — BENZONATATE 100 MG PO CAPS: 100.0000 mg | ORAL_CAPSULE | Freq: Three times a day (TID) | ORAL | Status: DC | PRN

## 2014-02-07 MED ORDER — AZITHROMYCIN 250 MG PO TABS: ORAL_TABLET | ORAL | Status: DC

## 2014-02-07 NOTE — Progress Notes (Signed)
   Subjective:    Patient ID: Sydney [name], female    DOB: [birth year], 18 y.o.   MRN: [id number]  HPI Patient is here for c/o sore throat and uri sx's.  Review of Systems  Constitutional: Negative for fever.  HENT: Negative for ear pain.   Eyes: Negative for discharge.  Respiratory: Negative for cough.   Cardiovascular: Negative for chest pain.  Gastrointestinal: Negative for abdominal distention.  Endocrine: Negative for polyuria.  Genitourinary: Negative for difficulty urinating.  Musculoskeletal: Negative for gait problem and neck pain.  Skin: Negative for color change and rash.  Neurological: Negative for speech difficulty and headaches.  Psychiatric/Behavioral: Negative for agitation.       Objective:    BP 117/71 mmHg  Pulse 93  Temp(Src) 97.7 F (36.5 C) (Oral)  Ht 5\' 4"  (1.626 m)  Wt 196 lb 3.2 oz (88.996 kg)  BMI 33.66 kg/m2  LMP 01/31/2014 Physical Exam  Constitutional: She is oriented to person, place, and time. She appears well-developed and well-nourished.  HENT:  Head: Normocephalic and atraumatic.  Mouth/Throat: Oropharynx is clear and moist.  Eyes: Pupils are equal, round, and reactive to light.  Neck: Normal range of motion. Neck supple.  Cardiovascular: Normal rate and regular rhythm.   No murmur heard. Pulmonary/Chest: Effort normal and breath sounds normal.  Abdominal: Soft. Bowel sounds are normal. There is no tenderness.  Neurological: She is alert and oriented to person, place, and time.  Skin: Skin is warm and dry.  Psychiatric: She has a normal mood and affect.          Assessment & Plan:     ICD-9-CM ICD-10-CM   1. Sore throat 462 J02.9 POCT Influenza A/B     POCT rapid strep A  2. Body aches 780.96 R52 POCT Influenza A/B     POCT rapid strep A   Push po fluids, rest, tylenol and motrin otc prn as directed for fever, arthralgias, and myalgias.  Follow up prn if sx's continue or persist.  No Follow-up on file.  Deatra CanterWilliam J Oxford  FNP

## 2014-02-25 ENCOUNTER — Encounter: Payer: Self-pay | Admitting: Family

## 2014-02-25 ENCOUNTER — Ambulatory Visit (INDEPENDENT_AMBULATORY_CARE_PROVIDER_SITE_OTHER): Payer: BLUE CROSS/BLUE SHIELD | Admitting: Family

## 2014-02-25 VITALS — BP 116/77 | HR 102 | Temp 97.3°F | Ht 64.0 in | Wt 198.0 lb

## 2014-02-25 DIAGNOSIS — R319 Hematuria, unspecified: Secondary | ICD-10-CM

## 2014-02-25 DIAGNOSIS — R300 Dysuria: Secondary | ICD-10-CM

## 2014-02-25 DIAGNOSIS — N390 Urinary tract infection, site not specified: Secondary | ICD-10-CM

## 2014-02-25 LAB — POCT UA - MICROSCOPIC ONLY
CASTS, UR, LPF, POC: NEGATIVE
CRYSTALS, UR, HPF, POC: NEGATIVE
Mucus, UA: NEGATIVE
Yeast, UA: NEGATIVE

## 2014-02-25 LAB — POCT URINALYSIS DIPSTICK
Bilirubin, UA: NEGATIVE
Glucose, UA: NEGATIVE
Ketones, UA: NEGATIVE
NITRITE UA: POSITIVE
Protein, UA: NEGATIVE
Spec Grav, UA: 1.015
UROBILINOGEN UA: NEGATIVE
pH, UA: 7

## 2014-02-25 MED ORDER — SULFAMETHOXAZOLE-TRIMETHOPRIM 800-160 MG PO TABS: 1.0000 | ORAL_TABLET | Freq: Two times a day (BID) | ORAL | Status: DC

## 2014-02-25 NOTE — Addendum Note (Signed)
Addended by: Tommas OlpHANDY, Cheryn Lundquist N on: 02/25/2014 04:48 PM   Modules accepted: Orders

## 2014-02-25 NOTE — Patient Instructions (Signed)

## 2014-02-25 NOTE — Progress Notes (Signed)
   Subjective:    Patient ID: Sydney [name], female    DOB: [birth year], 19 y.o.   MRN: [id number]  Dysuria  This is a new problem. The current episode started in the past 7 days. The problem occurs every urination. The problem has been unchanged. There has been no fever. Associated symptoms include flank pain, frequency, hesitancy and urgency. Pertinent negatives include no discharge. She has tried increased fluids for the symptoms. The treatment provided moderate relief.      Review of Systems  Constitutional: Negative.   HENT: Negative.   Eyes: Negative.   Respiratory: Negative.  Negative for shortness of breath.   Cardiovascular: Negative.  Negative for palpitations.  Gastrointestinal: Negative.   Endocrine: Negative.   Genitourinary: Positive for dysuria, hesitancy, urgency, frequency and flank pain.  Musculoskeletal: Negative.   Neurological: Negative.  Negative for headaches.  Hematological: Negative.   Psychiatric/Behavioral: Negative.   All other systems reviewed and are negative.      Objective:   Physical Exam  Constitutional: She is oriented to person, place, and time. She appears well-developed and well-nourished. No distress.  Cardiovascular: Normal rate, regular rhythm, normal heart sounds and intact distal pulses.   No murmur heard. Pulmonary/Chest: Effort normal and breath sounds normal. No respiratory distress. She has no wheezes.  Abdominal: Soft. Bowel sounds are normal. She exhibits no distension. There is no tenderness.  Musculoskeletal: Normal range of motion. She exhibits no edema or tenderness.  Negative CVA tenderness   Neurological: She is alert and oriented to person, place, and time. She has normal reflexes. No cranial nerve deficit.  Skin: Skin is warm and dry.  Psychiatric: She has a normal mood and affect. Her behavior is normal. Judgment and thought content normal.  Vitals reviewed.   BP 116/77 mmHg  Pulse 102  Temp(Src) 97.3 F (36.3 C)  (Oral)  Ht 5\' 4"  (1.626 m)  Wt 198 lb (89.812 kg)  BMI 33.97 kg/m2  LMP 01/31/2014       Assessment & Plan:  1. Dysuria - POCT urinalysis dipstick - POCT UA - Microscopic Only - sulfamethoxazole-trimethoprim (BACTRIM DS,SEPTRA DS) 800-160 MG per tablet; Take 1 tablet by mouth 2 (two) times daily.  Dispense: 10 tablet; Refill: 0  2. Urinary tract infection with hematuria, site unspecified -Force fluids AZO over the counter X2 days RTO prn - sulfamethoxazole-trimethoprim (BACTRIM DS,SEPTRA DS) 800-160 MG per tablet; Take 1 tablet by mouth 2 (two) times daily.  Dispense: 10 tablet; Refill: 0  Jannifer Rodneyhristy Dacari Beckstrand, FNP

## 2014-02-27 ENCOUNTER — Other Ambulatory Visit: Payer: Self-pay | Admitting: Family

## 2014-02-27 LAB — URINE CULTURE

## 2014-02-27 MED ORDER — NITROFURANTOIN MONOHYD MACRO 100 MG PO CAPS: 100.0000 mg | ORAL_CAPSULE | Freq: Two times a day (BID) | ORAL | Status: DC

## 2014-06-09 ENCOUNTER — Encounter: Payer: Self-pay | Admitting: Physician Assistant

## 2014-06-09 ENCOUNTER — Ambulatory Visit (INDEPENDENT_AMBULATORY_CARE_PROVIDER_SITE_OTHER): Payer: BLUE CROSS/BLUE SHIELD | Admitting: Physician Assistant

## 2014-06-09 VITALS — BP 117/77 | HR 84 | Temp 97.1°F | Ht 64.0 in | Wt 191.0 lb

## 2014-06-09 DIAGNOSIS — J3089 Other allergic rhinitis: Secondary | ICD-10-CM | POA: Diagnosis not present

## 2014-06-09 DIAGNOSIS — J020 Streptococcal pharyngitis: Secondary | ICD-10-CM | POA: Diagnosis not present

## 2014-06-09 LAB — POCT RAPID STREP A (OFFICE): RAPID STREP A SCREEN: NEGATIVE

## 2014-06-09 MED ORDER — FLUTICASONE PROPIONATE 50 MCG/ACT NA SUSP: 2.0000 | Freq: Every day | NASAL | Status: DC

## 2014-06-09 MED ORDER — CETIRIZINE HCL 10 MG PO TABS: 10.0000 mg | ORAL_TABLET | Freq: Every day | ORAL | Status: DC

## 2014-06-09 NOTE — Progress Notes (Signed)
   Subjective:    Patient ID: Ferman HammingKayla Ziglar, female    DOB: 1995/08/06, 19 y.o.   MRN: [id number]  HPI 19 Y/O female presents with c/o bumps on hand that started yesterday. She picked up a kitten on the road yesterday and held him a lot. Today the spots are hurting. Fever was 102 yesterday, relieved with Tylenol. Chills yesterday. Has taken sudafed, musinex inconsistently with relief.     Review of Systems  Constitutional: Positive for chills (yesterday ). Negative for fever and diaphoresis.  HENT: Positive for sore throat (yesterday, asymptomatic today ). Negative for congestion, ear pain and sneezing.   Eyes: Negative.   Respiratory: Positive for cough (productive x 2 weeks. ). Negative for shortness of breath and wheezing.   Gastrointestinal: Negative.   All other systems reviewed and are negative.      Objective:   Physical Exam  Constitutional: She is oriented to person, place, and time. She appears well-developed and well-nourished. No distress.  Strep negative.   HENT:  Head: Normocephalic and atraumatic.  Right Ear: External ear normal.  Left Ear: External ear normal.  Nose: Nose normal.  Mouth/Throat: Oropharynx is clear and moist. No oropharyngeal exudate.  Erythematous papule on anterior hairline   Cardiovascular: Normal rate, regular rhythm and normal heart sounds.   No murmur heard. Pulmonary/Chest: Effort normal and breath sounds normal.  Musculoskeletal: Normal range of motion. She exhibits tenderness (on bottom of feel bilateral ). She exhibits no edema.  Neurological: She is alert and oriented to person, place, and time.  Skin: She is not diaphoretic.  Approximately 5 erythematous papules on palmar surfaces of bilateral hands. Negative for edema  Psychiatric: She has a normal mood and affect. Her behavior is normal.  Nursing note and vitals reviewed.         Assessment & Plan:  1. Streptococcal sore throat  - Culture, Group A Strep - POCT rapid strep A  negative   2. Other allergic rhinitis - Do not handle cat. Make sure it is treated for fleas and bathed.  - cetirizine (ZYRTEC) 10 MG tablet; Take 1 tablet (10 mg total) by mouth daily.  Dispense: 30 tablet; Refill: 11 - fluticasone (FLONASE) 50 MCG/ACT nasal spray; Place 2 sprays into both nostrils daily.  Dispense: 16 g; Refill: 6   rto prn   Cebastian Neis A. Chauncey ReadingGann PA-C

## 2014-06-09 NOTE — Patient Instructions (Signed)

## 2014-06-10 ENCOUNTER — Encounter: Payer: Self-pay | Admitting: Physician Assistant

## 2014-06-10 ENCOUNTER — Ambulatory Visit (INDEPENDENT_AMBULATORY_CARE_PROVIDER_SITE_OTHER): Payer: BLUE CROSS/BLUE SHIELD | Admitting: Physician Assistant

## 2014-06-10 VITALS — BP 124/81 | HR 97 | Temp 97.7°F | Ht 64.0 in | Wt 191.0 lb

## 2014-06-10 DIAGNOSIS — R609 Edema, unspecified: Secondary | ICD-10-CM | POA: Diagnosis not present

## 2014-06-10 DIAGNOSIS — R21 Rash and other nonspecific skin eruption: Secondary | ICD-10-CM | POA: Diagnosis not present

## 2014-06-10 DIAGNOSIS — R600 Localized edema: Secondary | ICD-10-CM

## 2014-06-10 MED ORDER — PREDNISONE 10 MG (21) PO TBPK: ORAL_TABLET | ORAL | Status: DC

## 2014-06-10 NOTE — Addendum Note (Signed)
Addended by: Prescott GumLAND, Nikie Cid M on: 06/10/2014 12:15 PM   Modules accepted: Orders

## 2014-06-10 NOTE — Patient Instructions (Addendum)
Increase Zyrtec to twice daily. Report to ER if shortness of breath or throat swelling occurs.    Rash A rash is a change in the color or feel of your skin. There are many different types of rashes. You may have other problems along with your rash. HOME CARE  Avoid the thing that caused your rash.  Do not scratch your rash.  You may take cools baths to help stop itching.  Only take medicines as told by your doctor.  Keep all doctor visits as told. GET HELP RIGHT AWAY IF:   Your pain, puffiness (swelling), or redness gets worse.  You have a fever.  You have new or severe problems.  You have body aches, watery poop (diarrhea), or you throw up (vomit).  Your rash is not better after 3 days. MAKE SURE YOU:   Understand these instructions.  Will watch your condition.  Will get help right away if you are not doing well or get worse. Document Released: 07/17/2007 Document Revised: 04/22/2011 Document Reviewed: 11/12/2010 [facility] Patient Information 2015 [place] [place]. This information is not intended to replace advice given to you by your health care provider. Make sure you discuss any questions you have with your health care provider. Hand, Foot, and Mouth Disease Hand, foot, and mouth disease is a common viral illness. It occurs mainly in children younger than 21 years of age, but adolescents and adults may also get it. This disease is different than foot and mouth disease that cattle, sheep, and pigs get. Most people are better in 1 week. CAUSES  Hand, foot, and mouth disease is usually caused by a group of viruses called enteroviruses. Hand, foot, and mouth disease can spread from person to person (contagious). A person is most contagious during the first week of the illness. It is not transmitted to or from pets or other animals. It is most common in the summer and early fall. Infection is spread from person to person by direct contact with an infected person's:  Nose  discharge.  Throat discharge.  Stool. SYMPTOMS  Open sores (ulcers) occur in the mouth. Symptoms may also include:  A rash on the hands and feet, and occasionally the buttocks.  Fever.  Aches.  Pain from the mouth ulcers.  Fussiness. DIAGNOSIS  Hand, foot, and mouth disease is one of many infections that cause mouth sores. To be certain your child has hand, foot, and mouth disease your caregiver will diagnose your child by physical exam.Additional tests are not usually needed. TREATMENT  Nearly all patients recover without medical treatment in 7 to 10 days. There are no common complications. Your child should only take over-the-counter or prescription medicines for pain, discomfort, or fever as directed by your caregiver. Your caregiver may recommend the use of an over-the-counter antacid or a combination of an antacid and diphenhydramine to help coat the lesions in the mouth and improve symptoms.  HOME CARE INSTRUCTIONS  Try combinations of foods to see what your child will tolerate and aim for a balanced diet. Soft foods may be easier to swallow. The mouth sores from hand, foot, and mouth disease typically hurt and are painful when exposed to salty, spicy, or acidic food or drinks.  Milk and cold drinks are soothing for some patients. Milk shakes, frozen ice pops, slushies, and sherberts are usually well tolerated.  Sport drinks are good choices for hydration, and they also provide a few calories. Often, a child with hand, foot, and mouth disease will be able  to drink without discomfort.   For younger children and infants, feeding with a cup, spoon, or syringe may be less painful than drinking through the nipple of a bottle.  Keep children out of childcare programs, schools, or other group settings during the first few days of the illness or until they are without fever. The sores on the body are not contagious. SEEK IMMEDIATE MEDICAL CARE IF:  Your child develops signs of  dehydration such as:  Decreased urination.  Dry mouth, tongue, or lips.  Decreased tears or sunken eyes.  Dry skin.  Rapid breathing.  Fussy behavior.  Poor color or pale skin.  Fingertips taking longer than 2 seconds to turn pink after a gentle squeeze.  Rapid weight loss.  Your child does not have adequate pain relief.  Your child develops a severe headache, stiff neck, or change in behavior.  Your child develops ulcers or blisters that occur on the lips or outside of the mouth. Document Released: 10/27/2002 Document Revised: 04/22/2011 Document Reviewed: 07/12/2010 [facility] Patient Information 2015 [place], [place]. This information is not intended to replace advice given to you by your health care provider. Make sure you discuss any questions you have with your health care provider.

## 2014-06-10 NOTE — Progress Notes (Signed)
   Subjective:    Patient ID: Sydney [name], female    DOB: 1995-11-06, 19 y.o.   MRN: [id number]  HPI 19 y/o female presents with worsening symptoms. Was diagnosed with allergic rhinitis yesterday. Has developed painful, pruritic rash on bilateral feet, hands and scalp. Initial sore throat 2 days ago with high fever of 102. Took $RemoveB'10mg'PrhLfrMS$  zyrtec last night     Review of Systems  Skin: Positive for rash (itch, painful rash on feet, hands, scalp).       Edema, pain in both feet Pain in hands        Objective:   Physical Exam  Musculoskeletal: She exhibits edema (trace edema on bilateral feet. non pitting).  Skin: Rash (Numerous erythematous papules on hands, feet , scalp and lower abdomen with a few umbilicated blister like lesions on hands and one on left side of nose. ) noted.          Assessment & Plan:  1. Rash and nonspecific skin eruption - Increase Zyrtec to BID until f/u  - predniSONE (STERAPRED UNI-PAK 21 TAB) 10 MG (21) TBPK tablet; Take as directed  Dispense: 21 tablet; Refill: 0  - Cannot r/o allergic dermatitis d/t concurrent timing with addition of cat to home. Advised patient to leave cat outside.  - Cannot r/o Fifths Disease which should resolve in a few days. Symptomatic treatment of tylenol for pain   2. Edema extremities - - predniSONE (STERAPRED UNI-PAK 21 TAB) 10 MG (21) TBPK tablet; Take as directed  Dispense: 21 tablet; Refill: 0 - POCT CBC - BMP8+EGFR  to r/o infection or electrolyte abnormality causing edema   Continue all meds Labs pending Health Maintenance reviewed Diet and exercise encouraged RTO 1 week Report to ED if symptoms worse or throat swelling occurs prior to f/u     Arthea Nobel A. Benjamin Stain PA-C

## 2014-06-11 LAB — CBC WITH DIFFERENTIAL/PLATELET
Basophils Absolute: 0 10*3/uL (ref 0.0–0.2)
Basos: 0 %
EOS (ABSOLUTE): 0.1 10*3/uL (ref 0.0–0.4)
Eos: 2 %
Hematocrit: 43.8 % (ref 34.0–46.6)
Hemoglobin: 14.4 g/dL (ref 11.1–15.9)
IMMATURE GRANULOCYTES: 0 %
Immature Grans (Abs): 0 10*3/uL (ref 0.0–0.1)
Lymphocytes Absolute: 1.1 10*3/uL (ref 0.7–3.1)
Lymphs: 22 %
MCH: 27.2 pg (ref 26.6–33.0)
MCHC: 32.9 g/dL (ref 31.5–35.7)
MCV: 83 fL (ref 79–97)
MONOCYTES: 13 %
Monocytes Absolute: 0.6 10*3/uL (ref 0.1–0.9)
Neutrophils Absolute: 2.9 10*3/uL (ref 1.4–7.0)
Neutrophils: 63 %
Platelets: 182 10*3/uL (ref 150–379)
RBC: 5.29 x10E6/uL — ABNORMAL HIGH (ref 3.77–5.28)
RDW: 13.7 % (ref 12.3–15.4)
WBC: 4.7 10*3/uL (ref 3.4–10.8)

## 2014-06-11 LAB — BMP8+EGFR
BUN / CREAT RATIO: 11 (ref 8–20)
BUN: 8 mg/dL (ref 6–20)
CO2: 23 mmol/L (ref 18–29)
CREATININE: 0.74 mg/dL (ref 0.57–1.00)
Calcium: 9.5 mg/dL (ref 8.7–10.2)
Chloride: 100 mmol/L (ref 97–108)
GFR calc non Af Amer: 117 mL/min/{1.73_m2} (ref >59)
GFR, EST AFRICAN AMERICAN: 135 mL/min/{1.73_m2} (ref >59)
Glucose: 79 mg/dL (ref 65–99)
Potassium: 4 mmol/L (ref 3.5–5.2)
Sodium: 139 mmol/L (ref 134–144)

## 2014-06-12 LAB — CULTURE, GROUP A STREP: Strep A Culture: NEGATIVE

## 2014-06-16 ENCOUNTER — Ambulatory Visit: Payer: BLUE CROSS/BLUE SHIELD | Admitting: Physician Assistant

## 2014-08-22 ENCOUNTER — Ambulatory Visit: Payer: BLUE CROSS/BLUE SHIELD | Admitting: Family Medicine

## 2015-01-19 ENCOUNTER — Encounter: Payer: Self-pay | Admitting: Pediatrics

## 2015-01-19 ENCOUNTER — Ambulatory Visit (INDEPENDENT_AMBULATORY_CARE_PROVIDER_SITE_OTHER): Payer: BLUE CROSS/BLUE SHIELD | Admitting: Pediatrics

## 2015-01-19 VITALS — BP 116/80 | HR 78 | Temp 97.4°F | Ht 64.0 in | Wt 187.6 lb

## 2015-01-19 DIAGNOSIS — J019 Acute sinusitis, unspecified: Secondary | ICD-10-CM | POA: Diagnosis not present

## 2015-01-19 DIAGNOSIS — F17200 Nicotine dependence, unspecified, uncomplicated: Secondary | ICD-10-CM

## 2015-01-19 MED ORDER — AMOXICILLIN-POT CLAVULANATE 875-125 MG PO TABS: 1.0000 | ORAL_TABLET | Freq: Two times a day (BID) | ORAL | Status: DC

## 2015-01-19 NOTE — Progress Notes (Signed)
Subjective:    Patient ID: Sydney [name], female    DOB: 26-Dec-1995, 19 y.o.   MRN: [id number]  CC: Nasal Congestion and Sinus Pressure   HPI: Sydney [name] is a 19 y.o. female presenting for Nasal Congestion and Sinus Pressure  Lots of nasal congestion Coughing, somewhat productive Ongoing for past two weeks Taking some pseudophed, didn't help Has dark green coming out of nose Lots of sinus pressure Had some chest pain this morning at work Works at customer Current smoker, apprx 5 a day Interested in quitting, has quit before cold [place] when pregnant Husband has heart condition, he is also smoker Both have tried to quit, end up fighting a lot   Depression screen PHQ 2/9 01/19/2015  Decreased Interest 0  Down, Depressed, Hopeless 0  PHQ - 2 Score 0     Relevant past medical, surgical, family and social history reviewed and updated as indicated. Interim medical history since our last visit reviewed. Allergies and medications reviewed and updated.    ROS: Per HPI unless specifically indicated above  History  Smoking status  . Current Every Day Smoker -- 1.00 packs/day  . Types: Cigarettes  . Last Attempt to Quit: 08/20/2012  Smokeless tobacco  . Never Used    Past Medical History Patient Active Problem List   Diagnosis Date Noted  . Pancreatitis due to common bile duct stone 05/22/2013  . Acute pancreatitis 05/20/2013  . Choledocholithiasis with acute cholecystitis 05/20/2013  . Active labor at term 04/12/2013  . Supervision of low-risk first pregnancy 12/22/2012    Current Outpatient Prescriptions  Medication Sig Dispense Refill  . amoxicillin-clavulanate (AUGMENTIN) 875-125 MG tablet Take 1 tablet by mouth 2 (two) times daily. 20 tablet 0   No current facility-administered medications for this visit.       Objective:    BP 116/80 mmHg  Pulse 78  Temp(Src) 97.4 F (36.3 C) (Oral)  Ht 5\' 4"  (1.626 m)  Wt 187 lb 9.6 oz (85.095 kg)  BMI 32.19  kg/m2  Wt Readings from Last 3 Encounters:  01/19/15 187 lb 9.6 oz (85.095 kg)  06/10/14 191 lb (86.637 kg)  06/09/14 191 lb (86.637 kg)    Gen: NAD, alert, cooperative with exam, NCAT EYES: EOMI, no scleral injection or icterus ENT:  TMs pearly gray b/l, OP with mild erythema, tender over max sinuses b/l LYMPH: no cervical LAD CV: NRRR, normal S1/S2, no murmur, distal pulses 2+ b/l Resp: CTABL, no wheezes, normal WOB Abd: +BS, soft, NTND. no guarding or organomegaly Ext: No edema, warm Neuro: Alert and oriented     Assessment & Plan:    Dorathy DaftKayla was seen today for nasal congestion and sinus pressure.  Diagnoses and all orders for this visit:  Acute sinusitis, recurrence not specified, unspecified location Ongoing symptoms x 2 weeks. Will treat with below abx. Discussed sinus rinses, lots of fluids, symptomatic care. -     amoxicillin-clavulanate (AUGMENTIN) 875-125 MG tablet; Take 1 tablet by mouth 2 (two) times daily.  Tobacco use disorder I spent more than 3 minutes discussing smoking cessation strategies. Pt now smoking 5 a day, has decreased since she got a new car. Smokes often at work on her breaks. Talked about finding alternative things to do when she would usually want to smoke. Can try nicotine patch, would start with 7 mg, may need just half a patch.    Follow up plan: Return in about 2 months (around 03/22/2015) for tobacco cessation.   Rex Krasarol Rhonin Trott,  MD [facility] 01/19/2015, 4:25 PM

## 2015-01-19 NOTE — Patient Instructions (Addendum)
Neti pot 2-3 times a day with distilled water  Ibuprofen 600mg  three times a day  Drink lots of fluids (water!)

## 2015-03-20 ENCOUNTER — Ambulatory Visit (INDEPENDENT_AMBULATORY_CARE_PROVIDER_SITE_OTHER): Payer: BLUE CROSS/BLUE SHIELD | Admitting: Family Medicine

## 2015-03-20 ENCOUNTER — Encounter: Payer: Self-pay | Admitting: Family Medicine

## 2015-03-20 VITALS — BP 109/73 | HR 83 | Temp 97.8°F | Ht 64.0 in | Wt 190.4 lb

## 2015-03-20 DIAGNOSIS — J0101 Acute recurrent maxillary sinusitis: Secondary | ICD-10-CM

## 2015-03-20 DIAGNOSIS — Z720 Tobacco use: Secondary | ICD-10-CM | POA: Insufficient documentation

## 2015-03-20 MED ORDER — FLUTICASONE PROPIONATE 50 MCG/ACT NA SUSP: 2.0000 | Freq: Every day | NASAL | Status: DC

## 2015-03-20 MED ORDER — CEFDINIR 300 MG PO CAPS: 300.0000 mg | ORAL_CAPSULE | Freq: Two times a day (BID) | ORAL | Status: DC

## 2015-03-20 MED ORDER — FLUCONAZOLE 150 MG PO TABS: 150.0000 mg | ORAL_TABLET | Freq: Once | ORAL | Status: DC

## 2015-03-20 NOTE — Progress Notes (Signed)
   HPI  Patient presents today ear with 3 days of concern for sinus infection.  Patiensplints that she's had bilateral maxillary sinus pressure, ear pain with muffled hearing, subjective fever with chills, and cough for about 3 days.  She had a similar sinus infection in December, however she only finished 2 days of Augmentin due to yeast infection.  She denies any chest pain, dyspnea, or food or fluid intolerance.   PMH: Smoking status noted ROS: Per HPI  Objective: BP 109/73 mmHg  Pulse 83  Temp(Src) 97.8 F (36.6 C) (Oral)  Ht  (1.626 m)  Wt 190 lb 6.4 oz (86.365 kg)  BMI 32.67 kg/m2 Gen: NAD, alert, cooperative with exam HEENT: NCAT, Bilateral maxillary tenderness to palpation, TMs normal bilaterally, some swollen tonsils with no exudates, mmm CV: RRR, good S1/S2, no murmur Resp: CTABL, no wheezes, non-labored Ext: No edema, warm Neuro: Alert and oriented, No gross deficits  Assessment and plan:  # acute maxillary sinusitis Tx with omnicef, previous partial augmentin course- discouraged repeating this Diflucan  Flonase for recurrence- discussed smoking RTC if worsening or not better as expected.     Meds ordered this encounter  Medications  . cefdinir (OMNICEF) 300 MG capsule    Sig: Take 1 capsule (300 mg total) by mouth 2 (two) times daily. 1 po BID    Dispense:  20 capsule    Refill:  0  . fluticasone (FLONASE) 50 MCG/ACT nasal spray    Sig: Place 2 sprays into both nostrils daily.    Dispense:  16 g    Refill:  6  . fluconazole (DIFLUCAN) 150 MG tablet    Sig: Take 1 tablet (150 mg total) by mouth once. Repeat in 3-5 days    Dispense:  2 tablet    Refill:  0    Murtis Sink, MD [facility] 03/20/2015, 9:11 AM

## 2015-03-20 NOTE — Patient Instructions (Signed)
Great to meet you!  Come back if you do not get better as expected.   Use flonase everyday- with your sinuses getting infected frequently this may make a real difference for you to keep the infections away.   Sinusitis, Adult Sinusitis is redness, soreness, and puffiness (inflammation) of the air pockets in the bones of your face (sinuses). The redness, soreness, and puffiness can cause air and mucus to get trapped in your sinuses. This can allow germs to grow and cause an infection.  HOME CARE   Drink enough fluids to keep your pee (urine) clear or pale yellow.  Use a humidifier in your home.  Run a hot shower to create steam in the bathroom. Sit in the bathroom with the door closed. Breathe in the steam 3-4 times a day.  Put a warm, moist washcloth on your face 3-4 times a day, or as told by your doctor.  Use salt water sprays (saline sprays) to wet the thick fluid in your nose. This can help the sinuses drain.  Only take medicine as told by your doctor. GET HELP RIGHT AWAY IF:   Your pain gets worse.  You have very bad headaches.  You are sick to your stomach (nauseous).  You throw up (vomit).  You are very sleepy (drowsy) all the time.  Your face is puffy (swollen).  Your vision changes.  You have a stiff neck.  You have trouble breathing. MAKE SURE YOU:   Understand these instructions.  Will watch your condition.  Will get help right away if you are not doing well or get worse.   This information is not intended to replace advice given to you by your health care provider. Make sure you discuss any questions you have with your health care provider.   Document Released: 07/17/2007 Document Revised: 02/18/2014 Document Reviewed: 09/03/2011 Elsevier Interactive Patient Education [employer]

## 2015-03-24 ENCOUNTER — Ambulatory Visit: Payer: BLUE CROSS/BLUE SHIELD | Admitting: Pediatrics

## 2015-03-27 ENCOUNTER — Encounter: Payer: Self-pay | Admitting: Family

## 2015-04-21 ENCOUNTER — Telehealth: Payer: Self-pay | Admitting: Family

## 2015-04-21 NOTE — Telephone Encounter (Signed)
She can do an e visit if she is on mychart If does not have fever- doubt it is flu

## 2015-04-21 NOTE — Telephone Encounter (Signed)
Pt aware by VM  

## 2015-05-20 ENCOUNTER — Encounter: Payer: Self-pay | Admitting: Family Medicine

## 2015-05-20 ENCOUNTER — Ambulatory Visit (INDEPENDENT_AMBULATORY_CARE_PROVIDER_SITE_OTHER): Payer: BLUE CROSS/BLUE SHIELD | Admitting: Family Medicine

## 2015-05-20 VITALS — BP 142/85 | HR 110 | Temp 96.5°F | Ht 64.0 in | Wt 205.0 lb

## 2015-05-20 DIAGNOSIS — M791 Myalgia: Secondary | ICD-10-CM

## 2015-05-20 DIAGNOSIS — M7918 Myalgia, other site: Secondary | ICD-10-CM

## 2015-05-20 DIAGNOSIS — R109 Unspecified abdominal pain: Secondary | ICD-10-CM

## 2015-05-20 NOTE — Progress Notes (Signed)
   HPI  Patient presents today with back pain.  Patient explains that she's had about 24 hours and dull achy bilateral back pain. She describes it as bilateral paraspinal back pain with some radiation to the bilateral groin. She had an episode of passing what she thought was a kidney stone with some resolution of sharp type pain, she describes the stone as approximately 2 cm in diameter with some blood in the toilet.  She denies any fever, chills, sweats. She is making urine what usual No dysuria. No recent injury to the back.  PMH: Smoking status noted ROS: Per HPI  Objective: BP 142/85 mmHg  Pulse 110  Temp(Src) 96.5 F (35.8 C) (Oral)  Ht 5\' 4"  (1.626 m)  Wt 205 lb (92.987 kg)  BMI 35.17 kg/m2  LMP 05/13/2015 (Approximate) Gen: NAD, alert, cooperative with exam HEENT: NCAT CV: RRR, good S1/S2, no murmur Resp: CTABL, no wheezes, non-labored Abd: Soft, mild tenderness to palpation in the right upper quadrant and suprapubic area Ext: No edema, warm Neuro: Alert and oriented   musculoskeletal: Mild tenderness to palpation bilateral paraspinal muscles  Assessment and plan:  # SQ was skeletal back pain Urinalysis is negative for blood, discussed that it is very unlikely for her to be able to pass a 2 cm stone without any blood, also notes very unlikely that she has a stone remaining without any blood in her urine. No signs of you GI IM Toradol given Scheduled NSAIDs 3-5 days Heat Reviewed reasons to return .    Murtis SinkSam Jya Hughston, MD [facility] 05/20/2015, 11:14 AM

## 2015-05-20 NOTE — Patient Instructions (Signed)
Great to see you!  Try heat 15 minutes a few times a day  2 aleve twice daily for 3-5 days  Let us know if anything changes

## 2015-05-22 LAB — URINALYSIS
Bilirubin, UA: NEGATIVE
Glucose, UA: NEGATIVE
Ketones, UA: NEGATIVE
LEUKOCYTES UA: NEGATIVE
NITRITE UA: NEGATIVE
PH UA: 6 (ref 5.0–7.5)
Protein, UA: NEGATIVE
RBC, UA: NEGATIVE
Specific Gravity, UA: 1.015 (ref 1.005–1.030)
Urobilinogen, Ur: 0.2 mg/dL (ref 0.2–1.0)

## 2015-07-17 ENCOUNTER — Ambulatory Visit: Payer: BLUE CROSS/BLUE SHIELD | Admitting: Family Medicine

## 2015-07-18 ENCOUNTER — Encounter: Payer: Self-pay | Admitting: Family

## 2015-08-07 ENCOUNTER — Encounter: Payer: Self-pay | Admitting: Physician Assistant

## 2015-08-07 ENCOUNTER — Ambulatory Visit (INDEPENDENT_AMBULATORY_CARE_PROVIDER_SITE_OTHER): Payer: BLUE CROSS/BLUE SHIELD | Admitting: Physician Assistant

## 2015-08-07 VITALS — BP 118/75 | HR 84 | Temp 97.3°F | Ht 64.0 in | Wt 220.2 lb

## 2015-08-07 DIAGNOSIS — R635 Abnormal weight gain: Secondary | ICD-10-CM

## 2015-08-07 DIAGNOSIS — R358 Other polyuria: Secondary | ICD-10-CM | POA: Diagnosis not present

## 2015-08-07 DIAGNOSIS — R3589 Other polyuria: Secondary | ICD-10-CM

## 2015-08-07 LAB — MICROSCOPIC EXAMINATION
Bacteria, UA: NONE SEEN
RBC, UA: NONE SEEN /hpf (ref 0–?)

## 2015-08-07 LAB — URINALYSIS, COMPLETE
Bilirubin, UA: NEGATIVE
GLUCOSE, UA: NEGATIVE
Ketones, UA: NEGATIVE
Leukocytes, UA: NEGATIVE
Nitrite, UA: NEGATIVE
PROTEIN UA: NEGATIVE
RBC UA: NEGATIVE
Specific Gravity, UA: 1.025 (ref 1.005–1.030)
UUROB: 0.2 mg/dL (ref 0.2–1.0)
pH, UA: 6 (ref 5.0–7.5)

## 2015-08-07 NOTE — Progress Notes (Signed)
Subjective:     Patient ID: Sydney [name], female   DOB: Aug 10, 1995, 20 y.o.   MRN: [id number]  HPI Pt with polyuria and urgency  Sx worse in the am She denies any vaginal complaints Using Nexplanon for birth control Also has noticed wgt gain since stopping smoking 4 months ago  Review of Systems  Constitutional: Negative for fever.  Gastrointestinal: Positive for abdominal pain. Negative for nausea, vomiting, constipation and abdominal distention.  Genitourinary: Positive for urgency and frequency. Negative for dysuria, hematuria, decreased urine volume, vaginal bleeding, vaginal discharge, difficulty urinating, vaginal pain and pelvic pain.       Objective:   Physical Exam  Pulmonary/Chest: Effort normal and breath sounds normal. She has no wheezes.  Abdominal: Soft. Bowel sounds are normal. She exhibits no distension and no mass. There is tenderness. There is no rebound and no guarding.  LUQ/LLQ TTP No CVAT  Nursing note and vitals reviewed. UA- see labs     Assessment:     1. Polyuria   2. Weight gain        Plan:     Pt 4 month without smoking and congratulated here on that fact Would like for her to watch diet and incorporate exercise UA was normal so reviewed other causes for sx- ie constipation, ovarian cyst Push fluids If sx continue F/U for further eval

## 2015-08-07 NOTE — Patient Instructions (Signed)
Dysuria Dysuria is pain or discomfort while urinating. The pain or discomfort may be felt in the tube that carries urine out of the bladder (urethra) or in the surrounding tissue of the genitals. The pain may also be felt in the groin area, lower abdomen, and lower back. You may have to urinate frequently or have the sudden feeling that you have to urinate (urgency). Dysuria can affect both men and women, but is more common in women. Dysuria can be caused by many different things, including:  Urinary tract infection in women.  Infection of the kidney or bladder.  Kidney stones or bladder stones.  Certain sexually transmitted infections (STIs), such as chlamydia.  Dehydration.  Inflammation of the vagina.  Use of certain medicines.  Use of certain soaps or scented products that cause irritation. HOME CARE INSTRUCTIONS Watch your dysuria for any changes. The following actions may help to reduce any discomfort you are feeling:  Drink enough fluid to keep your urine clear or pale yellow.  Empty your bladder often. Avoid holding urine for long periods of time.  After a bowel movement or urination, women should cleanse from front to back, using each tissue only once.  Empty your bladder after sexual intercourse.  Take medicines only as directed by your health care provider.  If you were prescribed an antibiotic medicine, finish it all even if you start to feel better.  Avoid caffeine, tea, and alcohol. They can irritate the bladder and make dysuria worse. In men, alcohol may irritate the prostate.  Keep all follow-up visits as directed by your health care provider. This is important.  If you had any tests done to find the cause of dysuria, it is your responsibility to obtain your test results. Ask the lab or department performing the test when and how you will get your results. Talk with your health care provider if you have any questions about your results. SEEK MEDICAL CARE  IF:  You develop pain in your back or sides.  You have a fever.  You have nausea or vomiting.  You have blood in your urine.  You are not urinating as often as you usually do. SEEK IMMEDIATE MEDICAL CARE IF:  You pain is severe and not relieved with medicines.  You are unable to hold down any fluids.  You or someone else notices a change in your mental function.  You have a rapid heartbeat at rest.  You have shaking or chills.  You feel extremely weak.   This information is not intended to replace advice given to you by your health care provider. Make sure you discuss any questions you have with your health care provider.   Document Released: 10/27/2003 Document Revised: 02/18/2014 Document Reviewed: 09/23/2013 Elsevier Interactive Patient Education 2016 [employer] Smoking Cessation, Tips for Success If you are ready to quit smoking, congratulations! You have chosen to help yourself be healthier. Cigarettes bring nicotine, tar, carbon monoxide, and other irritants into your body. Your lungs, heart, and blood vessels will be able to work better without these poisons. There are many different ways to quit smoking. Nicotine gum, nicotine patches, a nicotine inhaler, or nicotine nasal spray can help with physical craving. Hypnosis, support groups, and medicines help break the habit of smoking. WHAT THINGS CAN I DO TO MAKE QUITTING EASIER?  Here are some tips to help you quit for good:  Pick a date when you will quit smoking completely. Tell all of your friends and family about your plan to  quit on that date.  Do not try to slowly cut down on the number of cigarettes you are smoking. Pick a quit date and quit smoking completely starting on that day.  Throw away all cigarettes.   Clean and remove all ashtrays from your home, work, and car.  On a card, write down your reasons for quitting. Carry the card with you and read it when you get the urge to smoke.  Cleanse your body  of nicotine. Drink enough water and fluids to keep your urine clear or pale yellow. Do this after quitting to flush the nicotine from your body.  Learn to predict your moods. Do not let a bad situation be your excuse to have a cigarette. Some situations in your life might tempt you into wanting a cigarette.  Never have "just one" cigarette. It leads to wanting another and another. Remind yourself of your decision to quit.  Change habits associated with smoking. If you smoked while driving or when feeling stressed, try other activities to replace smoking. Stand up when drinking your coffee. Brush your teeth after eating. Sit in a different chair when you read the paper. Avoid alcohol while trying to quit, and try to drink fewer caffeinated beverages. Alcohol and caffeine may urge you to smoke.  Avoid foods and drinks that can trigger a desire to smoke, such as sugary or spicy foods and alcohol.  Ask people who smoke not to smoke around you.  Have something planned to do right after eating or having a cup of coffee. For example, plan to take a walk or exercise.  Try a relaxation exercise to calm you down and decrease your stress. Remember, you may be tense and nervous for the first 2 weeks after you quit, but this will pass.  Find new activities to keep your hands busy. Play with a pen, coin, or rubber band. Doodle or draw things on paper.  Brush your teeth right after eating. This will help cut down on the craving for the taste of tobacco after meals. You can also try mouthwash.   Use oral substitutes in place of cigarettes. Try using lemon drops, carrots, cinnamon sticks, or chewing gum. Keep them handy so they are available when you have the urge to smoke.  When you have the urge to smoke, try deep breathing.  Designate your home as a nonsmoking area.  If you are a heavy smoker, ask your health care provider about a prescription for nicotine chewing gum. It can ease your withdrawal from  nicotine.  Reward yourself. Set aside the cigarette money you save and buy yourself something nice.  Look for support from others. Join a support group or smoking cessation program. Ask someone at home or at work to help you with your plan to quit smoking.  Always ask yourself, "Do I need this cigarette or is this just a reflex?" Tell yourself, "Today, I choose not to smoke," or "I do not want to smoke." You are reminding yourself of your decision to quit.  Do not replace cigarette smoking with electronic cigarettes (commonly called e-cigarettes). The safety of e-cigarettes is unknown, and some may contain harmful chemicals.  If you relapse, do not give up! Plan ahead and think about what you will do the next time you get the urge to smoke. HOW WILL I FEEL WHEN I QUIT SMOKING? You may have symptoms of withdrawal because your body is used to nicotine (the addictive substance in cigarettes). You may crave cigarettes, be  irritable, feel very hungry, cough often, get headaches, or have difficulty concentrating. The withdrawal symptoms are only temporary. They are strongest when you first quit but will go away within 10-14 days. When withdrawal symptoms occur, stay in control. Think about your reasons for quitting. Remind yourself that these are signs that your body is healing and getting used to being without cigarettes. Remember that withdrawal symptoms are easier to treat than the major diseases that smoking can cause.  Even after the withdrawal is over, expect periodic urges to smoke. However, these cravings are generally short lived and will go away whether you smoke or not. Do not smoke! WHAT RESOURCES ARE AVAILABLE TO HELP ME QUIT SMOKING? Your health care provider can direct you to community resources or hospitals for support, which may include:  Group support.  Education.  Hypnosis.  Therapy.   This information is not intended to replace advice given to you by your health care provider.  Make sure you discuss any questions you have with your health care provider.   Document Released: 10/27/2003 Document Revised: 02/18/2014 Document Reviewed: 07/16/2012 Elsevier Interactive Patient Education [employer] Inc[employer]

## 2015-08-10 ENCOUNTER — Encounter: Payer: Self-pay | Admitting: Pediatrics

## 2015-08-10 ENCOUNTER — Ambulatory Visit (INDEPENDENT_AMBULATORY_CARE_PROVIDER_SITE_OTHER): Payer: BLUE CROSS/BLUE SHIELD | Admitting: Pediatrics

## 2015-08-10 VITALS — BP 130/86 | HR 84 | Temp 97.5°F | Ht 64.0 in | Wt 224.0 lb

## 2015-08-10 DIAGNOSIS — Z6838 Body mass index (BMI) 38.0-38.9, adult: Secondary | ICD-10-CM | POA: Diagnosis not present

## 2015-08-10 DIAGNOSIS — Z716 Tobacco abuse counseling: Secondary | ICD-10-CM | POA: Diagnosis not present

## 2015-08-10 DIAGNOSIS — M545 Low back pain, unspecified: Secondary | ICD-10-CM

## 2015-08-10 DIAGNOSIS — R350 Frequency of micturition: Secondary | ICD-10-CM | POA: Diagnosis not present

## 2015-08-10 DIAGNOSIS — Z720 Tobacco use: Secondary | ICD-10-CM

## 2015-08-10 MED ORDER — BUPROPION HCL ER (SMOKING DET) 150 MG PO TB12: 150.0000 mg | ORAL_TABLET | Freq: Two times a day (BID) | ORAL | Status: DC

## 2015-08-10 MED ORDER — IBUPROFEN 600 MG PO TABS: 600.0000 mg | ORAL_TABLET | Freq: Three times a day (TID) | ORAL | Status: DC | PRN

## 2015-08-10 NOTE — Patient Instructions (Addendum)
Walk or swim 3 days a week  Cut out all Dr. Alcus DadPeppers, try to drink non-sugary beverages Look at sugar amount on labels and try to decrease overall intake  Carrots or other healthy snacks when you want to snack  Ibuprofen 600mg  three times a day for the next week, then take as needed for back pain  Gentle back exercises twice a day  Labs today  Wellbutrin/buproprion 150 mg once a day for 3 days then twice a day until you see me again   Back Exercises If you have pain in your back, do these exercises 2-3 times each day or as told by your doctor. When the pain goes away, do the exercises once each day, but repeat the steps more times for each exercise (do more repetitions). If you do not have pain in your back, do these exercises once each day or as told by your doctor. EXERCISES Single Knee to Chest Do these steps 3-5 times in a row for each leg: 1. Lie on your back on a firm bed or the floor with your legs stretched out. 2. Bring one knee to your chest. 3. Hold your knee to your chest by grabbing your knee or thigh. 4. Pull on your knee until you feel a gentle stretch in your lower back. 5. Keep doing the stretch for 10-30 seconds. 6. Slowly let go of your leg and straighten it. Pelvic Tilt Do these steps 5-10 times in a row: 1. Lie on your back on a firm bed or the floor with your legs stretched out. 2. Bend your knees so they point up to the ceiling. Your feet should be flat on the floor. 3. Tighten your lower belly (abdomen) muscles to press your lower back against the floor. This will make your tailbone point up to the ceiling instead of pointing down to your feet or the floor. 4. Stay in this position for 5-10 seconds while you gently tighten your muscles and breathe evenly. Cat-Cow Do these steps until your lower back bends more easily: 1. Get on your hands and knees on a firm surface. Keep your hands under your shoulders, and keep your knees under your hips. You may put  padding under your knees. 2. Let your head hang down, and make your tailbone point down to the floor so your lower back is round like the back of a cat. 3. Stay in this position for 5 seconds. 4. Slowly lift your head and make your tailbone point up to the ceiling so your back hangs low (sags) like the back of a cow. 5. Stay in this position for 5 seconds. Press-Ups Do these steps 5-10 times in a row: 1. Lie on your belly (face-down) on the floor. 2. Place your hands near your head, about shoulder-width apart. 3. While you keep your back relaxed and keep your hips on the floor, slowly straighten your arms to raise the top half of your body and lift your shoulders. Do not use your back muscles. To make yourself more comfortable, you may change where you place your hands. 4. Stay in this position for 5 seconds. 5. Slowly return to lying flat on the floor. Bridges Do these steps 10 times in a row: 1. Lie on your back on a firm surface. 2. Bend your knees so they point up to the ceiling. Your feet should be flat on the floor. 3. Tighten your butt muscles and lift your butt off of the floor until your waist is  almost as high as your knees. If you do not feel the muscles working in your butt and the back of your thighs, slide your feet 1-2 inches farther away from your butt. 4. Stay in this position for 3-5 seconds. 5. Slowly lower your butt to the floor, and let your butt muscles relax. If this exercise is too easy, try doing it with your arms crossed over your chest. Belly Crunches Do these steps 5-10 times in a row: 1. Lie on your back on a firm bed or the floor with your legs stretched out. 2. Bend your knees so they point up to the ceiling. Your feet should be flat on the floor. 3. Cross your arms over your chest. 4. Tip your chin a little bit toward your chest but do not bend your neck. 5. Tighten your belly muscles and slowly raise your chest just enough to lift your shoulder blades a tiny  bit off of the floor. 6. Slowly lower your chest and your head to the floor. Back Lifts Do these steps 5-10 times in a row: 1. Lie on your belly (face-down) with your arms at your sides, and rest your forehead on the floor. 2. Tighten the muscles in your legs and your butt. 3. Slowly lift your chest off of the floor while you keep your hips on the floor. Keep the back of your head in line with the curve in your back. Look at the floor while you do this. 4. Stay in this position for 3-5 seconds. 5. Slowly lower your chest and your face to the floor. GET HELP IF:  Your back pain gets a lot worse when you do an exercise.  Your back pain does not lessen 2 hours after you exercise. If you have any of these problems, stop doing the exercises. Do not do them again unless your doctor says it is okay. GET HELP RIGHT AWAY IF:  You have sudden, very bad back pain. If this happens, stop doing the exercises. Do not do them again unless your doctor says it is okay.   This information is not intended to replace advice given to you by your health care provider. Make sure you discuss any questions you have with your health care provider.   Document Released: 03/02/2010 Document Revised: 10/19/2014 Document Reviewed: 03/24/2014 Elsevier Interactive Patient Education [employer] Elsevier Inc.

## 2015-08-10 NOTE — Progress Notes (Signed)
    Subjective:    Patient ID: Sydney [name], female    DOB: 1995-03-04, 20 y.o.   MRN: [id number]  CC: Back Pain; Urinary Urgency; and Painful urination   HPI: Sydney [name] is a 20 y.o. female presenting for Back Pain; Urinary Urgency; and Painful urination  Has constant low back pain Sitting hurts Sits all day at work Bending leg hurts   Had some groin pain L side Today no groin pain Has had UTIs in th epast This does not feel the same No dysuria now Feels like she is going to he bathroom all the time, much more than normal and peeing larger amounts Recently quit smoking Has gained 30 lbs since then Drinking some caffeine with every meal    [facility] 08/10/2015 08/07/2015 01/19/2015  Decreased Interest 0 0 0  Down, Depressed, Hopeless 0 0 0  PHQ - 2 Score 0 0 0     Relevant past medical, surgical, family and social history reviewed and updated as indicated.  Interim medical history since our last visit reviewed. Allergies and medications reviewed and updated.  ROS: Per HPI unless specifically indicated above  History  Smoking status  . Former Smoker -- 1.00 packs/day  . Types: Cigarettes  . Quit date: 04/11/2015  Smokeless tobacco  . Never Used       Objective:    BP 130/86 mmHg  Pulse 84  Temp(Src) 97.5 F (36.4 C) (Oral)  Ht 5\' 4"  (1.626 m)  Wt 224 lb (101.606 kg)  BMI 38.43 kg/m2  LMP 07/31/2015 (Approximate)  Wt Readings from Last 3 Encounters:  08/10/15 224 lb (101.606 kg)  08/07/15 220 lb 3.2 oz (99.882 kg)  05/20/15 205 lb (92.987 kg)    Gen: NAD, alert, cooperative with exam, NCAT EYES: EOMI, no scleral injection or icterus CV: NRRR, normal S1/S2, no murmur, distal pulses 2+ b/l Resp: CTABL, no wheezes, normal WOB Abd: +BS, soft, NTND. no guarding or organomegaly Ext: No edema, warm Neuro: Alert and oriented, strength equal b/l UE and LE, coordination grossly normal MSK: tender para spinous muscles lower back b/l, normal  ROM of spine     Assessment & Plan:    Sydney [name] was seen today for back pain and frequent urination.  Diagnoses and all orders for this visit:  Urinary frequency Normal UA two days ago -     Basic Metabolic Panel  Encounter for smoking cessation counseling Trying to quit smoking, start below -     buPROPion (ZYBAN) 150 MG 12 hr tablet; Take 1 tablet (150 mg total) by mouth 2 (two) times daily.  BMI 38.0-38.9,adult Has gained apprx 30 lbs since smoking cessation Discussed lifestyle changes, nutrition goals  Bilateral low back pain without sciatica Discussed increase in weight recently likely exacerbating. Pt motivated to lose weight. -     ibuprofen (ADVIL,MOTRIN) 600 MG tablet; Take 1 tablet (600 mg total) by mouth every 8 (eight) hours as needed.  Follow up plan: Return in about 6 weeks (around 09/21/2015).  Rex Krasarol Jaianna Nicoll, MD [facility] 08/10/2015, 4:40 PM

## 2015-08-11 LAB — BASIC METABOLIC PANEL
BUN / CREAT RATIO: 16 (ref 9–23)
BUN: 12 mg/dL (ref 6–20)
CO2: 24 mmol/L (ref 18–29)
Calcium: 10.1 mg/dL (ref 8.7–10.2)
Chloride: 101 mmol/L (ref 96–106)
Creatinine, Ser: 0.75 mg/dL (ref 0.57–1.00)
GFR, EST AFRICAN AMERICAN: 132 mL/min/{1.73_m2} (ref >59)
GFR, EST NON AFRICAN AMERICAN: 114 mL/min/{1.73_m2} (ref >59)
Glucose: 93 mg/dL (ref 65–99)
POTASSIUM: 5.1 mmol/L (ref 3.5–5.2)
Sodium: 145 mmol/L — ABNORMAL HIGH (ref 134–144)

## 2016-01-09 ENCOUNTER — Telehealth: Payer: Self-pay | Admitting: Family

## 2016-01-12 ENCOUNTER — Ambulatory Visit (INDEPENDENT_AMBULATORY_CARE_PROVIDER_SITE_OTHER): Payer: BLUE CROSS/BLUE SHIELD | Admitting: Family Medicine

## 2016-01-12 ENCOUNTER — Encounter: Payer: Self-pay | Admitting: Family Medicine

## 2016-01-12 VITALS — BP 126/84 | HR 90 | Temp 97.5°F | Ht 64.0 in | Wt 231.0 lb

## 2016-01-12 DIAGNOSIS — Z3009 Encounter for other general counseling and advice on contraception: Secondary | ICD-10-CM

## 2016-01-12 DIAGNOSIS — Z3046 Encounter for surveillance of implantable subdermal contraceptive: Secondary | ICD-10-CM

## 2016-01-12 MED ORDER — LEVONORGEST-ETH ESTRAD 91-DAY 0.15-0.03 &0.01 MG PO TABS: 1.0000 | ORAL_TABLET | Freq: Every day | ORAL | 4 refills | Status: DC

## 2016-01-16 NOTE — Progress Notes (Signed)
BP 126/84   Pulse 90   Temp 97.5 F (36.4 C) (Oral)   Ht 5\' 4"  (1.626 m)   Wt 231 lb (104.8 kg)   BMI 39.65 kg/m    Subjective:    Patient ID: Sydney [name], female    DOB: May 31, 1995, 20 y.o.   MRN: [id number]  HPI: Sydney [name] is a 20 y.o. female presenting on 01/12/2016 for Nexplanon removal   HPI Birth control counseling and Nexplanon removal Patient is coming in today because she wants her Nexplanon removed, it has been 4 or 5 years since she had it placed and she would like to discuss other forms of birth control. On the next been on she thinks she did have some issues with weight gain and would like to switch to something that is hormonally different. She is currently sexually active but has been using alternative methods of birth control such as condoms every time. She denies any vaginal discharge or bleeding. She has really not had her periods since she's been on the 1.  Relevant past medical, surgical, family and social history reviewed and updated as indicated. Interim medical history since our last visit reviewed. Allergies and medications reviewed and updated.  Review of Systems  Constitutional: Positive for unexpected weight change. Negative for chills and fever.  HENT: Negative for congestion, ear discharge and ear pain.   Eyes: Negative for redness and visual disturbance.  Respiratory: Negative for chest tightness and shortness of breath.   Cardiovascular: Negative for chest pain and leg swelling.  Gastrointestinal: Negative for abdominal pain.  Genitourinary: Negative for difficulty urinating, dysuria, menstrual problem, vaginal bleeding, vaginal discharge and vaginal pain.  Musculoskeletal: Negative for back pain and gait problem.  Skin: Negative for rash.  Neurological: Negative for light-headedness and headaches.  Psychiatric/Behavioral: Negative for agitation and behavioral problems.  All other systems reviewed and are negative.   Per HPI unless  specifically indicated above     Objective:    BP 126/84   Pulse 90   Temp 97.5 F (36.4 C) (Oral)   Ht 5\' 4"  (1.626 m)   Wt 231 lb (104.8 kg)   BMI 39.65 kg/m   Wt Readings from Last 3 Encounters:  01/12/16 231 lb (104.8 kg)  08/10/15 224 lb (101.6 kg)  08/07/15 220 lb 3.2 oz (99.9 kg)    Physical Exam  Constitutional: She is oriented to person, place, and time. She appears well-developed and well-nourished. No distress.  Eyes: Conjunctivae are normal.  Cardiovascular: Normal rate, regular rhythm, normal heart sounds and intact distal pulses.   No murmur heard. Pulmonary/Chest: Effort normal and breath sounds normal. No respiratory distress. She has no wheezes.  Abdominal: Soft. Bowel sounds are normal. She exhibits no distension. There is no tenderness. There is no rebound.  Musculoskeletal: Normal range of motion. She exhibits no edema or tenderness.  Neurological: She is alert and oriented to person, place, and time. Coordination normal.  Skin: Skin is warm and dry. No rash noted. She is not diaphoretic.  Palpable next been on in her left arm, able to move without difficulty  Psychiatric: She has a normal mood and affect. Her behavior is normal.  Nursing note and vitals reviewed.   Nexplanon removal: Site was prepped with Betadine, able to palpate easily the device. 2 mL of 2% lidocaine with without epinephrine were used for local anesthesia. Small 0.1 cm incision was made to find the distal tip of the device. Grasped with clamps and able to remove  the entire device. Placed Steri-Strips and compression dressing. Patient tolerated procedure well with minimal bleeding.    Assessment & Plan:   Problem List Items Addressed This Visit    None    Visit Diagnoses    Birth control counseling    -  Primary   Relevant Medications   Levonorgestrel-Ethinyl Estradiol (AMETHIA,CAMRESE) 0.15-0.03 &0.01 MG tablet   Nexplanon removal           Follow up plan: Return in about 1  year (around 01/11/2017), or if symptoms worsen or fail to improve.  Counseling provided for all of the vaccine components No orders of the defined types were placed in this encounter.   Arville CareJoshua Bern Fare, MD [facility] 01/16/2016, 10:19 PM

## 2016-02-06 NOTE — Telephone Encounter (Signed)
Pt had appt 01/12/16 with Dr.Dettinger to have removed, will close encounter.

## 2016-03-02 ENCOUNTER — Ambulatory Visit (INDEPENDENT_AMBULATORY_CARE_PROVIDER_SITE_OTHER): Payer: BLUE CROSS/BLUE SHIELD | Admitting: Physician Assistant

## 2016-03-02 VITALS — BP 118/76 | HR 87 | Temp 97.8°F | Ht 64.0 in | Wt 230.0 lb

## 2016-03-02 DIAGNOSIS — B373 Candidiasis of vulva and vagina: Secondary | ICD-10-CM

## 2016-03-02 DIAGNOSIS — B3731 Acute candidiasis of vulva and vagina: Secondary | ICD-10-CM

## 2016-03-02 MED ORDER — FLUCONAZOLE 150 MG PO TABS: 150.0000 mg | ORAL_TABLET | ORAL | 4 refills | Status: DC

## 2016-03-02 NOTE — Patient Instructions (Signed)

## 2016-03-03 NOTE — Progress Notes (Signed)
BP 118/76   Pulse 87   Temp 97.8 F (36.6 C) (Oral)   Ht 5\' 4"  (1.626 m)   Wt 230 lb (104.3 kg)   BMI 39.48 kg/m    Subjective:    Patient ID: Sydney [name], female    DOB: January 19, 1996, 21 y.o.   MRN: [id number]  HPI: Sydney [name] is a 21 y.o. female presenting on 03/02/2016 for Vaginitis (pt here today c/o vaginal itching and burning sensation with thick white discharge that usually occurs prior to her menses, Monistat isn't working)  Patient reports that she has never tried Diflucan in the past. Due to the recurrence and difficulty of treating this. We are going to try Diflucan 1 weekly for 1 month and see if this greatly improves her symptoms. After that she can maintain on taking that once a month. She reports that she does have increased symptoms around her cycle.  Relevant past medical, surgical, family and social history reviewed and updated as indicated. Allergies and medications reviewed and updated.  Past Medical History:  Diagnosis Date  . Family history of anesthesia complication    grandparents  reaction unknown  . Gallstones   . Medical history non-contributory     Past Surgical History:  Procedure Laterality Date  . CHOLECYSTECTOMY N/A 05/22/2013   Procedure: LAPAROSCOPIC CHOLECYSTECTOMY WITH INTRAOPERATIVE CHOLANGIOGRAM;  Surgeon: Ernestene Mention, MD;  Location: [facility] OR;  Service: General;  Laterality: N/A;  . ERCP N/A 05/21/2013   Procedure: ENDOSCOPIC RETROGRADE CHOLANGIOPANCREATOGRAPHY (ERCP);  Surgeon: Theda Belfast, MD;  Location[facility] END[facility]COPY;  Service: Endoscopy;  Laterality: N/A;  1006 induction  . NO PAST SURGERIES      Review of Systems  Constitutional: Negative.   HENT: Negative.   Eyes: Negative.   Respiratory: Negative.   Gastrointestinal: Negative.   Genitourinary: Positive for vaginal discharge. Negative for dysuria, hematuria, vaginal bleeding and vaginal pain.    Allergies as of 03/02/2016      Reactions   Latex Rash      Medication  List       Accurate as of 03/02/16 11:59 PM. Always use your most recent med list.          fluconazole 150 MG tablet Commonly known as:  DIFLUCAN Take 1 tablet (150 mg total) by mouth once a week.   ibuprofen 600 MG tablet Commonly known as:  ADVIL,MOTRIN Take 1 tablet (600 mg total) by mouth every 8 (eight) hours as needed.   Levonorgestrel-Ethinyl Estradiol 0.15-0.03 &0.01 MG tablet Commonly known as:  AMETHIA,CAMRESE Take 1 tablet by mouth daily.          Objective:    BP 118/76   Pulse 87   Temp 97.8 F (36.6 C) (Oral)   Ht 5\' 4"  (1.626 m)   Wt 230 lb (104.3 kg)   BMI 39.48 kg/m   Allergies  Allergen Reactions  . Latex Rash    Physical Exam  Constitutional: She is oriented to person, place, and time. She appears well-developed and well-nourished.  HENT:  Head: Normocephalic and atraumatic.  Eyes: Conjunctivae and EOM are normal. Pupils are equal, round, and reactive to light.  Cardiovascular: Normal rate, regular rhythm, normal heart sounds and intact distal pulses.   Pulmonary/Chest: Effort normal and breath sounds normal.  Abdominal: Soft. Bowel sounds are normal.  Neurological: She is alert and oriented to person, place, and time. She has normal reflexes.  Skin: Skin is warm and dry. No rash noted.  Psychiatric: She has a  normal mood and affect. Her behavior is normal. Judgment and thought content normal.        Assessment & Plan:   1. Candidiasis, vagina - fluconazole (DIFLUCAN) 150 MG tablet; Take 1 tablet (150 mg total) by mouth once a week.  Dispense: 4 tablet; Refill: 4   Continue all other maintenance medications as listed above.  Follow up plan: Return if symptoms worsen or fail to improve.  No orders of the defined types were placed in this encounter.   Educational handout given for candida vaginitis  Remus LofflerAngel S. Saurav Crumble PA-C [facility] [address]  [place], [place] [address] [phone]   03/03/2016, 11:16  PM

## 2016-07-02 ENCOUNTER — Encounter: Payer: Self-pay | Admitting: Family

## 2016-07-02 ENCOUNTER — Ambulatory Visit (INDEPENDENT_AMBULATORY_CARE_PROVIDER_SITE_OTHER): Payer: BLUE CROSS/BLUE SHIELD | Admitting: Family

## 2016-07-02 VITALS — BP 134/79 | HR 107 | Temp 97.5°F | Ht 64.0 in | Wt 241.4 lb

## 2016-07-02 DIAGNOSIS — N912 Amenorrhea, unspecified: Secondary | ICD-10-CM

## 2016-07-02 DIAGNOSIS — Z3201 Encounter for pregnancy test, result positive: Secondary | ICD-10-CM | POA: Diagnosis not present

## 2016-07-02 LAB — PREGNANCY, URINE: Preg Test, Ur: POSITIVE — AB

## 2016-07-02 MED ORDER — PRENATAL VITAMINS 28-0.8 MG PO TABS: ORAL_TABLET | ORAL | 11 refills | Status: DC

## 2016-07-02 NOTE — Patient Instructions (Signed)
First Trimester of Pregnancy The first trimester of pregnancy is from week 1 until the end of week 13 (months 1 through 3). A week after a sperm fertilizes an egg, the egg will implant on the wall of the uterus. This embryo will begin to develop into a baby. Genes from you and your partner will form the baby. The female genes will determine whether the baby will be a boy or a girl. At 6-8 weeks, the eyes and face will be formed, and the heartbeat can be seen on ultrasound. At the end of 12 weeks, all the baby's organs will be formed. Now that you are pregnant, you will want to do everything you can to have a healthy baby. Two of the most important things are to get good prenatal care and to follow your health care provider's instructions. Prenatal care is all the medical care you receive before the baby's birth. This care will help prevent, find, and treat any problems during the pregnancy and childbirth. Body changes during your first trimester Your body goes through many changes during pregnancy. The changes vary from woman to woman.  You may gain or lose a couple of pounds at first.  You may feel sick to your stomach (nauseous) and you may throw up (vomit). If the vomiting is uncontrollable, call your health care provider.  You may tire easily.  You may develop headaches that can be relieved by medicines. All medicines should be approved by your health care provider.  You may urinate more often. Painful urination may mean you have a bladder infection.  You may develop heartburn as a result of your pregnancy.  You may develop constipation because certain hormones are causing the muscles that push stool through your intestines to slow down.  You may develop hemorrhoids or swollen veins (varicose veins).  Your breasts may begin to grow larger and become tender. Your nipples may stick out more, and the tissue that surrounds them (areola) may become darker.  Your gums may bleed and may be  sensitive to brushing and flossing.  Dark spots or blotches (chloasma, mask of pregnancy) may develop on your face. This will likely fade after the baby is born.  Your menstrual periods will stop.  You may have a loss of appetite.  You may develop cravings for certain kinds of food.  You may have changes in your emotions from day to day, such as being excited to be pregnant or being concerned that something may go wrong with the pregnancy and baby.  You may have more vivid and strange dreams.  You may have changes in your hair. These can include thickening of your hair, rapid growth, and changes in texture. Some women also have hair loss during or after pregnancy, or hair that feels dry or thin. Your hair will most likely return to normal after your baby is born.  What to expect at prenatal visits During a routine prenatal visit:  You will be weighed to make sure you and the baby are growing normally.  Your blood pressure will be taken.  Your abdomen will be measured to track your baby's growth.  The fetal heartbeat will be listened to between weeks 10 and 14 of your pregnancy.  Test results from any previous visits will be discussed.  Your health care provider may ask you:  How you are feeling.  If you are feeling the baby move.  If you have had any abnormal symptoms, such as leaking fluid, bleeding, severe headaches,   or abdominal cramping.  If you are using any tobacco products, including cigarettes, chewing tobacco, and electronic cigarettes.  If you have any questions.  Other tests that may be performed during your first trimester include:  Blood tests to find your blood type and to check for the presence of any previous infections. The tests will also be used to check for low iron levels (anemia) and protein on red blood cells (Rh antibodies). Depending on your risk factors, or if you previously had diabetes during pregnancy, you may have tests to check for high blood  sugar that affects pregnant women (gestational diabetes).  Urine tests to check for infections, diabetes, or protein in the urine.  An ultrasound to confirm the proper growth and development of the baby.  Fetal screens for spinal cord problems (spina bifida) and Down syndrome.  HIV (human immunodeficiency virus) testing. Routine prenatal testing includes screening for HIV, unless you choose not to have this test.  You may need other tests to make sure you and the baby are doing well.  Follow these instructions at home: Medicines  Follow your health care provider's instructions regarding medicine use. Specific medicines may be either safe or unsafe to take during pregnancy.  Take a prenatal vitamin that contains at least 600 micrograms (mcg) of folic acid.  If you develop constipation, try taking a stool softener if your health care provider approves. Eating and drinking  Eat a balanced diet that includes fresh fruits and vegetables, whole grains, good sources of protein such as meat, eggs, or tofu, and low-fat dairy. Your health care provider will help you determine the amount of weight gain that is right for you.  Avoid raw meat and uncooked cheese. These carry germs that can cause birth defects in the baby.  Eating four or five small meals rather than three large meals a day may help relieve nausea and vomiting. If you start to feel nauseous, eating a few soda crackers can be helpful. Drinking liquids between meals, instead of during meals, also seems to help ease nausea and vomiting.  Limit foods that are high in fat and processed sugars, such as fried and sweet foods.  To prevent constipation: ? Eat foods that are high in fiber, such as fresh fruits and vegetables, whole grains, and beans. ? Drink enough fluid to keep your urine clear or pale yellow. Activity  Exercise only as directed by your health care provider. Most women can continue their usual exercise routine during  pregnancy. Try to exercise for 30 minutes at least 5 days a week. Exercising will help you: ? Control your weight. ? Stay in shape. ? Be prepared for labor and delivery.  Experiencing pain or cramping in the lower abdomen or lower back is a good sign that you should stop exercising. Check with your health care provider before continuing with normal exercises.  Try to avoid standing for long periods of time. Move your legs often if you must stand in one place for a long time.  Avoid heavy lifting.  Wear low-heeled shoes and practice good posture.  You may continue to have sex unless your health care provider tells you not to. Relieving pain and discomfort  Wear a good support bra to relieve breast tenderness.  Take warm sitz baths to soothe any pain or discomfort caused by hemorrhoids. Use hemorrhoid cream if your health care provider approves.  Rest with your legs elevated if you have leg cramps or low back pain.  If you develop   varicose veins in your legs, wear support hose. Elevate your feet for 15 minutes, 3-4 times a day. Limit salt in your diet. Prenatal care  Schedule your prenatal visits by the twelfth week of pregnancy. They are usually scheduled monthly at first, then more often in the last 2 months before delivery.  Write down your questions. Take them to your prenatal visits.  Keep all your prenatal visits as told by your health care provider. This is important. Safety  Wear your seat belt at all times when driving.  Make a list of emergency phone numbers, including numbers for family, friends, the hospital, and police and fire departments. General instructions  Ask your health care provider for a referral to a local prenatal education class. Begin classes no later than the beginning of month 6 of your pregnancy.  Ask for help if you have counseling or nutritional needs during pregnancy. Your health care provider can offer advice or refer you to specialists for help  with various needs.  Do not use hot tubs, steam rooms, or saunas.  Do not douche or use tampons or scented sanitary pads.  Do not cross your legs for long periods of time.  Avoid cat litter boxes and soil used by cats. These carry germs that can cause birth defects in the baby and possibly loss of the fetus by miscarriage or stillbirth.  Avoid all smoking, herbs, alcohol, and medicines not prescribed by your health care provider. Chemicals in these products affect the formation and growth of the baby.  Do not use any products that contain nicotine or tobacco, such as cigarettes and e-cigarettes. If you need help quitting, ask your health care provider. You may receive counseling support and other resources to help you quit.  Schedule a dentist appointment. At home, brush your teeth with a soft toothbrush and be gentle when you floss. Contact a health care provider if:  You have dizziness.  You have mild pelvic cramps, pelvic pressure, or nagging pain in the abdominal area.  You have persistent nausea, vomiting, or diarrhea.  You have a bad smelling vaginal discharge.  You have pain when you urinate.  You notice increased swelling in your face, hands, legs, or ankles.  You are exposed to fifth disease or chickenpox.  You are exposed to German measles (rubella) and have never had it. Get help right away if:  You have a fever.  You are leaking fluid from your vagina.  You have spotting or bleeding from your vagina.  You have severe abdominal cramping or pain.  You have rapid weight gain or loss.  You vomit blood or material that looks like coffee grounds.  You develop a severe headache.  You have shortness of breath.  You have any kind of trauma, such as from a fall or a car accident. Summary  The first trimester of pregnancy is from week 1 until the end of week 13 (months 1 through 3).  Your body goes through many changes during pregnancy. The changes vary from  woman to woman.  You will have routine prenatal visits. During those visits, your health care provider will examine you, discuss any test results you may have, and talk with you about how you are feeling. This information is not intended to replace advice given to you by your health care provider. Make sure you discuss any questions you have with your health care provider. Document Released: 01/22/2001 Document Revised: 01/10/2016 Document Reviewed: 01/10/2016 Elsevier Interactive Patient Education  2017 Elsevier   Inc.  

## 2016-07-02 NOTE — Progress Notes (Signed)
   Subjective:    Patient ID: Ferman HammingKayla Ziglar, female    DOB: 01-17-1996, 21 y.o.   MRN: [id number]  HPI Pt presents to the office for amenorrhea. Pt states her last menstrual cycle was 05/28/16. Pt states she has one positive pregnancy test at home. PT states she is having nausea and vomiting.    Review of Systems  Genitourinary: Positive for menstrual problem.  All other systems reviewed and are negative.      Objective:   Physical Exam  Constitutional: She is oriented to person, place, and time. She appears well-developed and well-nourished. No distress.  Morbid obese  HENT:  Head: Normocephalic and atraumatic.  Eyes: Pupils are equal, round, and reactive to light.  Neck: Normal range of motion. Neck supple. No thyromegaly present.  Cardiovascular: Normal rate, regular rhythm, normal heart sounds and intact distal pulses.   No murmur heard. Pulmonary/Chest: Effort normal and breath sounds normal. No respiratory distress. She has no wheezes.  Abdominal: Soft. Bowel sounds are normal. She exhibits no distension. There is no tenderness.  Musculoskeletal: Normal range of motion. She exhibits no edema or tenderness.  Neurological: She is alert and oriented to person, place, and time.  Skin: Skin is warm and dry.  Psychiatric: She has a normal mood and affect. Her behavior is normal. Judgment and thought content normal.  Vitals reviewed.     BP 134/79   Pulse (!) 107   Temp 97.5 F (36.4 C) (Oral)   Ht 5\' 4"  (1.626 m)   Wt 241 lb 6.4 oz (109.5 kg)   LMP 05/28/2016   BMI 41.44 kg/m      Assessment & Plan:  1. Amenorrhea - Pregnancy, urine  2. Positive pregnancy test - Ambulatory referral to Obstetrics / Gynecology - Prenatal Vit-Fe Fumarate-FA (PRENATAL VITAMINS) 28-0.8 MG TABS; 1 tablet daily  Dispense: 30 tablet; Refill: 11   Force fluids Do not take any OTC meds  Start prenatal vitamins  Jannifer Rodneyhristy Braelyn Bordonaro, FNP

## 2016-07-04 ENCOUNTER — Encounter: Payer: Self-pay | Admitting: *Deleted

## 2016-07-05 ENCOUNTER — Other Ambulatory Visit: Payer: Self-pay | Admitting: *Deleted

## 2016-07-05 DIAGNOSIS — Z3201 Encounter for pregnancy test, result positive: Secondary | ICD-10-CM

## 2016-07-05 MED ORDER — PRENATAL VITAMINS 28-0.8 MG PO TABS: ORAL_TABLET | ORAL | 11 refills | Status: DC

## 2016-07-30 ENCOUNTER — Other Ambulatory Visit: Payer: Self-pay | Admitting: Obstetrics & Gynecology

## 2016-07-30 DIAGNOSIS — O3680X0 Pregnancy with inconclusive fetal viability, not applicable or unspecified: Secondary | ICD-10-CM

## 2016-07-31 ENCOUNTER — Ambulatory Visit (INDEPENDENT_AMBULATORY_CARE_PROVIDER_SITE_OTHER): Payer: BLUE CROSS/BLUE SHIELD

## 2016-07-31 DIAGNOSIS — Z029 Encounter for administrative examinations, unspecified: Secondary | ICD-10-CM

## 2016-07-31 DIAGNOSIS — O3680X0 Pregnancy with inconclusive fetal viability, not applicable or unspecified: Secondary | ICD-10-CM

## 2016-07-31 NOTE — Progress Notes (Signed)
US 9+1 wks,single IUP w/ys,normal ovaries bilat,fhr 164 bpm,crl 23.60 mm,EDD 03/04/2017

## 2016-08-15 ENCOUNTER — Telehealth: Payer: Self-pay | Admitting: *Deleted

## 2016-08-15 NOTE — Telephone Encounter (Signed)
Returned patient's call. States she is starting to feel better. She has used saline spray with some relief. Advised if she is taking antibiotics with no relief, sounded like viral infection. Informed it could get worse before getting better and usually peaks around day 5. Verbalized understanding.

## 2016-08-15 NOTE — Telephone Encounter (Signed)
Mailbox full . Unable to leave message

## 2016-08-17 ENCOUNTER — Inpatient Hospital Stay (HOSPITAL_COMMUNITY)
Admission: AD | Admit: 2016-08-17 | Discharge: 2016-08-17 | Disposition: A | Discharge status: Home / Self Care | Payer: BLUE CROSS/BLUE SHIELD | Source: Ambulatory Visit | Attending: Obstetrics and Gynecology | Admitting: Obstetrics and Gynecology

## 2016-08-17 ENCOUNTER — Encounter (HOSPITAL_COMMUNITY): Payer: Self-pay

## 2016-08-17 DIAGNOSIS — J069 Acute upper respiratory infection, unspecified: Secondary | ICD-10-CM | POA: Diagnosis not present

## 2016-08-17 DIAGNOSIS — Z87891 Personal history of nicotine dependence: Secondary | ICD-10-CM | POA: Insufficient documentation

## 2016-08-17 DIAGNOSIS — Z3A11 11 weeks gestation of pregnancy: Secondary | ICD-10-CM | POA: Diagnosis not present

## 2016-08-17 DIAGNOSIS — O99511 Diseases of the respiratory system complicating pregnancy, first trimester: Secondary | ICD-10-CM | POA: Insufficient documentation

## 2016-08-17 DIAGNOSIS — R0602 Shortness of breath: Secondary | ICD-10-CM | POA: Diagnosis present

## 2016-08-17 HISTORY — DX: Pneumonia, unspecified organism: J18.9

## 2016-08-17 HISTORY — DX: Bronchitis, not specified as acute or chronic: J40

## 2016-08-17 LAB — URINALYSIS, ROUTINE W REFLEX MICROSCOPIC
BACTERIA UA: NONE SEEN
BILIRUBIN URINE: NEGATIVE
Glucose, UA: NEGATIVE mg/dL
Hgb urine dipstick: NEGATIVE
KETONES UR: NEGATIVE mg/dL
Nitrite: NEGATIVE
PROTEIN: NEGATIVE mg/dL
SPECIFIC GRAVITY, URINE: 1.021 (ref 1.005–1.030)
pH: 6 (ref 5.0–8.0)

## 2016-08-17 MED ORDER — CEPHALEXIN 500 MG PO CAPS: 500.0000 mg | ORAL_CAPSULE | Freq: Four times a day (QID) | ORAL | 0 refills | Status: DC

## 2016-08-17 MED ORDER — ALBUTEROL SULFATE (2.5 MG/3ML) 0.083% IN NEBU
2.5000 mg | INHALATION_SOLUTION | Freq: Once | RESPIRATORY_TRACT | Status: AC
  Administered 2016-08-17: 2.5 mg via RESPIRATORY_TRACT

## 2016-08-17 MED ORDER — ALBUTEROL SULFATE (2.5 MG/3ML) 0.083% IN NEBU
5.0000 mg | INHALATION_SOLUTION | Freq: Once | RESPIRATORY_TRACT | Status: DC
  Filled 2016-08-17: qty 6

## 2016-08-17 MED ORDER — BENZONATATE 100 MG PO CAPS: 200.0000 mg | ORAL_CAPSULE | Freq: Three times a day (TID) | ORAL | 0 refills | Status: DC | PRN

## 2016-08-17 NOTE — Discharge Instructions (Signed)
Upper Respiratory Infection, Adult Most upper respiratory infections (URIs) are caused by a virus. A URI affects the nose, throat, and upper air passages. The most common type of URI is often called "the common cold." Follow these instructions at home:  Take medicines only as told by your doctor.  Gargle warm saltwater or take cough drops to comfort your throat as told by your doctor.  Use a warm mist humidifier or inhale steam from a shower to increase air moisture. This may make it easier to breathe.  Drink enough fluid to keep your pee (urine) clear or pale yellow.  Eat soups and other clear broths.  Have a healthy diet.  Rest as needed.  Go back to work when your fever is gone or your doctor says it is okay. ? You may need to stay home longer to avoid giving your URI to others. ? You can also wear a face mask and wash your hands often to prevent spread of the virus.  Use your inhaler more if you have asthma.  Do not use any tobacco products, including cigarettes, chewing tobacco, or electronic cigarettes. If you need help quitting, ask your doctor. Contact a doctor if:  You are getting worse, not better.  Your symptoms are not helped by medicine.  You have chills.  You are getting more short of breath.  You have brown or red mucus.  You have yellow or brown discharge from your nose.  You have pain in your face, especially when you bend forward.  You have a fever.  You have puffy (swollen) neck glands.  You have pain while swallowing.  You have white areas in the back of your throat. Get help right away if:  You have very bad or constant: ? Headache. ? Ear pain. ? Pain in your forehead, behind your eyes, and over your cheekbones (sinus pain). ? Chest pain.  You have long-lasting (chronic) lung disease and any of the following: ? Wheezing. ? Long-lasting cough. ? Coughing up blood. ? A change in your usual mucus.  You have a stiff neck.  You have  changes in your: ? Vision. ? Hearing. ? Thinking. ? Mood. This information is not intended to replace advice given to you by your health care provider. Make sure you discuss any questions you have with your health care provider. Document Released: 07/17/2007 Document Revised: 10/01/2015 Document Reviewed: 05/05/2013 Elsevier Interactive Patient Education  2018 Elsevier Inc.  

## 2016-08-17 NOTE — MAU Provider Note (Signed)
History   G2P1001 @ 11.4 wks in with c/o SOB, Upper resp symptoms since Wednesday. Cough, productive in nature, sputum is green. Denies any fever at present time.  CSN: [id number]  Arrival date & time 08/17/16  1912   None     Chief Complaint  Patient presents with  . Shortness of Breath  . Nasal Congestion    HPI  Past Medical History:  Diagnosis Date  . Bronchitis   . Family history of anesthesia complication    grandparents  reaction unknown  . Gallstones   . Medical history non-contributory   . Pneumonia     Past Surgical History:  Procedure Laterality Date  . CHOLECYSTECTOMY N/A 05/22/2013   Procedure: LAPAROSCOPIC CHOLECYSTECTOMY WITH INTRAOPERATIVE CHOLANGIOGRAM;  Surgeon: Ernestene MentionHaywood M Ingram, MD;  [facility];  Service: General;  Laterality: N/A;  . ERCP N/A 05/21/2013   Procedure: ENDOSCOPIC RETROGRADE CHOLANGIOPANCREATOGRAPHY (ERCP);  Surgeon: Theda BelfastPatrick D Hung, MD;  [facility];  Service: Endoscopy;  Laterality: N/A;  1006 induction    Family History  Problem Relation Age of Onset  . Healthy Mother   . Healthy Father   . Healthy Brother   . Hypertension Maternal Grandmother   . Stroke Maternal Grandfather   . Diabetes Paternal Grandmother   . Diabetes Paternal Grandfather     Social History  Substance Use Topics  . Smoking status: Former Smoker    Packs/day: 1.00    Types: Cigarettes    Quit date: 04/11/2015  . Smokeless tobacco: Never Used  . Alcohol use No    OB History    Gravida Para Term Preterm AB Living   2 1 1     1    SAB TAB Ectopic Multiple Live Births           1      Review of Systems  Constitutional: Negative.   HENT: Positive for congestion.   Eyes: Negative.   Respiratory: Positive for cough, chest tightness and shortness of breath.   Cardiovascular: Negative.   Gastrointestinal: Negative.   Endocrine: Negative.   Genitourinary: Negative.   Musculoskeletal: Negative.   Skin: Negative.   Allergic/Immunologic:  Negative.   Neurological: Negative.   Hematological: Negative.   Psychiatric/Behavioral: Negative.     Allergies  Latex  Home Medications    BP 135/68 (BP Location: Left Arm)   Pulse 90   Temp 97.8 F (36.6 C) (Oral)   Resp (!) 24   Ht 5\' 3"  (1.6 m)   Wt 230 lb (104.3 kg)   LMP 05/28/2016 (Exact Date)   SpO2 100%   BMI 40.74 kg/m   Physical Exam  Constitutional: She is oriented to person, place, and time. She appears well-developed and well-nourished.  HENT:  Head: Normocephalic.  Eyes: Pupils are equal, round, and reactive to light.  Neck: Normal range of motion.  Cardiovascular: Normal rate, regular rhythm, normal heart sounds and intact distal pulses.   Pulmonary/Chest: Effort normal and breath sounds normal.  Abdominal: Soft. Bowel sounds are normal.  Musculoskeletal: Normal range of motion.  Neurological: She is alert and oriented to person, place, and time. She has normal reflexes.  Skin: Skin is warm and dry.  Psychiatric: She has a normal mood and affect. Her behavior is normal. Judgment and thought content normal.    MAU Course  Procedures (including critical care time)  Labs Reviewed  URINALYSIS, ROUTINE W REFLEX MICROSCOPIC   No results found.   1. URTI (acute upper respiratory infection)  MDM  Heart RRR, LCTAB, VSS, will get nebulizer treatment. O2 sat 100%. Nebulizer tx from RT. Pt moving air well. Will d/c home on keflex and tessalon.

## 2016-08-17 NOTE — MAU Note (Signed)
Cold symptoms since Wednesday like runny nose and dry cough. Felt achey. Checked temp and No fever. No vomiting. No vaginal bleeding. Yesterday, started feeling short of breath.  Tried Benadryl allergy and Tylenol not helping. On antibiotics Augmentin for UTI.

## 2016-08-21 ENCOUNTER — Ambulatory Visit (INDEPENDENT_AMBULATORY_CARE_PROVIDER_SITE_OTHER): Payer: BLUE CROSS/BLUE SHIELD | Admitting: Women's Health

## 2016-08-21 ENCOUNTER — Encounter: Payer: Self-pay | Admitting: Women's Health

## 2016-08-21 ENCOUNTER — Other Ambulatory Visit (HOSPITAL_COMMUNITY)
Admission: RE | Admit: 2016-08-21 | Discharge: 2016-08-21 | Disposition: A | Discharge status: Home / Self Care | Payer: BLUE CROSS/BLUE SHIELD | Source: Ambulatory Visit | Attending: Obstetrics & Gynecology | Admitting: Obstetrics & Gynecology

## 2016-08-21 ENCOUNTER — Ambulatory Visit: Payer: BLUE CROSS/BLUE SHIELD | Admitting: *Deleted

## 2016-08-21 VITALS — BP 128/84 | HR 88 | Wt 226.0 lb

## 2016-08-21 DIAGNOSIS — Z3481 Encounter for supervision of other normal pregnancy, first trimester: Secondary | ICD-10-CM | POA: Diagnosis present

## 2016-08-21 DIAGNOSIS — Z124 Encounter for screening for malignant neoplasm of cervix: Secondary | ICD-10-CM

## 2016-08-21 DIAGNOSIS — Z3A12 12 weeks gestation of pregnancy: Secondary | ICD-10-CM | POA: Insufficient documentation

## 2016-08-21 DIAGNOSIS — Z1389 Encounter for screening for other disorder: Secondary | ICD-10-CM

## 2016-08-21 DIAGNOSIS — Z8719 Personal history of other diseases of the digestive system: Secondary | ICD-10-CM

## 2016-08-21 DIAGNOSIS — Z3490 Encounter for supervision of normal pregnancy, unspecified, unspecified trimester: Secondary | ICD-10-CM | POA: Insufficient documentation

## 2016-08-21 DIAGNOSIS — Z331 Pregnant state, incidental: Secondary | ICD-10-CM

## 2016-08-21 DIAGNOSIS — Z3682 Encounter for antenatal screening for nuchal translucency: Secondary | ICD-10-CM | POA: Diagnosis not present

## 2016-08-21 DIAGNOSIS — Z8279 Family history of other congenital malformations, deformations and chromosomal abnormalities: Secondary | ICD-10-CM

## 2016-08-21 NOTE — Patient Instructions (Signed)

## 2016-08-21 NOTE — Progress Notes (Signed)
Subjective:  Rolena InfanteKayla Cortopassi is a 21 y.o. 232P1001 Caucasian female at 2757w1d by LMP c/w 9wk u/s, being seen today for her first obstetrical visit.  Her obstetrical history is significant for term uncomplicated svb x 1.  Pregnancy history fully reviewed. Smoked prior to pregnancy, quit w/ +PT.   Patient reports no complaints. Denies vb, cramping, uti s/s, abnormal/malodorous vag d/c, or vulvovaginal itching/irritation.  BP 128/84   Pulse 88   Wt 226 lb (102.5 kg)   LMP 05/28/2016 (Exact Date)   BMI 40.03 kg/m   HISTORY: OB History  Gravida Para Term Preterm AB Living  2 1 1     1   SAB TAB Ectopic Multiple Live Births          1    # Outcome Date GA Lbr Len/2nd Weight Sex Delivery Anes PTL Lv  2 Current           1 Term 04/13/13 7517w0d 14:04 / 00:53 6 lb 14.4 oz (3.13 kg) F Vag-Spont EPI N LIV     Past Medical History:  Diagnosis Date  . Bronchitis   . Family history of anesthesia complication    grandparents  reaction unknown  . Gallstones   . Pneumonia    Past Surgical History:  Procedure Laterality Date  . CHOLECYSTECTOMY N/A 05/22/2013   Procedure: LAPAROSCOPIC CHOLECYSTECTOMY WITH INTRAOPERATIVE CHOLANGIOGRAM;  Surgeon: Ernestene MentionHaywood M Ingram, MD;  [facility];  Service: General;  Laterality: N/A;  . ERCP N/A 05/21/2013   Procedure: ENDOSCOPIC RETROGRADE CHOLANGIOPANCREATOGRAPHY (ERCP);  Surgeon: Theda BelfastPatrick D Hung, MD;  [facility];  Service: Endoscopy;  Laterality: N/A;  1006 induction   Family History  Problem Relation Age of Onset  . Healthy Mother   . Healthy Father   . Healthy Brother   . Hypertension Maternal Grandmother   . Stroke Maternal Grandfather   . Alzheimer's disease Paternal Grandmother   . Diabetes Paternal Grandfather   . Healthy Daughter   . Healthy Maternal Aunt   . Healthy Maternal Uncle   . Healthy Paternal Aunt     Exam   System:     General: Well developed & nourished, no acute distress   Skin: Warm & dry, normal coloration and  turgor, no rashes   Neurologic: Alert & oriented, normal mood   Cardiovascular: Regular rate & rhythm   Respiratory: Effort & rate normal, LCTAB, acyanotic   Abdomen: Soft, non tender   Extremities: normal strength, tone   Pelvic Exam:    Perineum: Normal perineum   Vulva: Normal, no lesions   Vagina:  Normal mucosa, normal discharge   Cervix: Normal, bulbous, appears closed   Uterus: Normal size/shape/contour for GA   Thin prep pap smear obtained w/ reflex high risk HPV cotesting FHR: 153 via doppler   Assessment:   Pregnancy: G2P1001 Patient Active Problem List   Diagnosis Date Noted  . Bilateral low back pain without sciatica 08/10/2015  . BMI 38.0-38.9,adult 08/10/2015  . Encounter for smoking cessation counseling 08/10/2015  . Pancreatitis due to common bile duct stone 05/22/2013  . Acute pancreatitis 05/20/2013  . Choledocholithiasis with acute cholecystitis 05/20/2013    4357w1d G2P1001 New OB visit Prev smoker  Plan:  Initial labs obtained Continue prenatal vitamins Problem list reviewed and updated Reviewed n/v relief measures and warning s/s to report Reviewed recommended weight gain based on pre-gravid BMI Encouraged well-balanced diet Genetic Screening discussed Integrated Screen: requested Cystic fibrosis screening discussed declined Ultrasound discussed; fetal survey: requested Follow up in  1 weeks for 1st IT/NT (no visit), then 4wks for 2nd IT and visit [facility] completed  Marge Duncans CNM, [facility] 08/21/2016 2:36 PM

## 2016-08-22 LAB — PMP SCREEN PROFILE (10S), URINE
AMPHETAMINE SCREEN URINE: NEGATIVE ng/mL
BARBITURATE SCREEN URINE: NEGATIVE ng/mL
BENZODIAZEPINE SCREEN, URINE: NEGATIVE ng/mL
CANNABINOIDS UR QL SCN: NEGATIVE ng/mL
CREATININE(CRT), U: 228.8 mg/dL (ref 20.0–300.0)
Cocaine (Metab) Scrn, Ur: NEGATIVE ng/mL
METHADONE SCREEN, URINE: NEGATIVE ng/mL
OXYCODONE+OXYMORPHONE UR QL SCN: NEGATIVE ng/mL
Opiate Scrn, Ur: NEGATIVE ng/mL
PH UR, DRUG SCRN: 5.6 (ref 4.5–8.9)
PHENCYCLIDINE QUANTITATIVE URINE: NEGATIVE ng/mL
PROPOXYPHENE SCREEN URINE: NEGATIVE ng/mL

## 2016-08-22 LAB — RPR: RPR Ser Ql: NONREACTIVE

## 2016-08-22 LAB — CBC
Hematocrit: 38.1 % (ref 34.0–46.6)
Hemoglobin: 13 g/dL (ref 11.1–15.9)
MCH: 26.6 pg (ref 26.6–33.0)
MCHC: 34.1 g/dL (ref 31.5–35.7)
MCV: 78 fL — AB (ref 79–97)
PLATELETS: 244 10*3/uL (ref 150–379)
RBC: 4.88 x10E6/uL (ref 3.77–5.28)
RDW: 14.3 % (ref 12.3–15.4)
WBC: 7.9 10*3/uL (ref 3.4–10.8)

## 2016-08-22 LAB — RUBELLA SCREEN: RUBELLA: 4.99 {index} (ref >0.99)

## 2016-08-22 LAB — ANTIBODY SCREEN: Antibody Screen: NEGATIVE

## 2016-08-22 LAB — URINALYSIS, ROUTINE W REFLEX MICROSCOPIC
Bilirubin, UA: NEGATIVE
Glucose, UA: NEGATIVE
Leukocytes, UA: NEGATIVE
NITRITE UA: NEGATIVE
PH UA: 5.5 (ref 5.0–7.5)
RBC, UA: NEGATIVE
Specific Gravity, UA: >=1.03 — AB (ref 1.005–1.030)
UUROB: 0.2 mg/dL (ref 0.2–1.0)

## 2016-08-22 LAB — ABO/RH: RH TYPE: POSITIVE

## 2016-08-22 LAB — HIV ANTIBODY (ROUTINE TESTING W REFLEX): HIV Screen 4th Generation wRfx: NONREACTIVE

## 2016-08-22 LAB — HEPATITIS B SURFACE ANTIGEN: Hepatitis B Surface Ag: NEGATIVE

## 2016-08-22 LAB — VARICELLA ZOSTER ANTIBODY, IGG: VARICELLA: 972 {index} (ref >165)

## 2016-08-24 LAB — CYTOLOGY - PAP
Chlamydia: NEGATIVE
Diagnosis: NEGATIVE
Neisseria Gonorrhea: NEGATIVE

## 2016-08-25 LAB — URINE CULTURE

## 2016-08-26 ENCOUNTER — Telehealth: Payer: Self-pay | Admitting: *Deleted

## 2016-08-26 ENCOUNTER — Encounter: Payer: Self-pay | Admitting: *Deleted

## 2016-08-26 ENCOUNTER — Other Ambulatory Visit: Payer: Self-pay | Admitting: Women's Health

## 2016-08-26 DIAGNOSIS — R8271 Bacteriuria: Secondary | ICD-10-CM | POA: Insufficient documentation

## 2016-08-26 DIAGNOSIS — O9989 Other specified diseases and conditions complicating pregnancy, childbirth and the puerperium: Principal | ICD-10-CM

## 2016-08-26 MED ORDER — CEPHALEXIN 500 MG PO CAPS: 500.0000 mg | ORAL_CAPSULE | Freq: Four times a day (QID) | ORAL | 0 refills | Status: DC

## 2016-08-26 NOTE — Telephone Encounter (Signed)
VM full unable to leave message. Will send mychart message.

## 2016-08-28 ENCOUNTER — Ambulatory Visit (INDEPENDENT_AMBULATORY_CARE_PROVIDER_SITE_OTHER): Payer: BLUE CROSS/BLUE SHIELD

## 2016-08-28 ENCOUNTER — Other Ambulatory Visit: Payer: BLUE CROSS/BLUE SHIELD

## 2016-08-28 ENCOUNTER — Encounter: Payer: Self-pay | Admitting: *Deleted

## 2016-08-28 DIAGNOSIS — Z3682 Encounter for antenatal screening for nuchal translucency: Secondary | ICD-10-CM | POA: Diagnosis not present

## 2016-08-28 NOTE — Progress Notes (Signed)
US 13+1 wks,measurements c/w dates,normal ovaries bilat,crl 75.66 mm,NB present,fhr 152 bpm,NT 1.7 mm,post pl gr 0

## 2016-09-04 LAB — MATERNAL SCREEN, INTEGRATED #1
CROWN RUMP LENGTH MAT SCREEN: 75.6 mm
GEST. AGE ON COLLECTION DATE: 13.1 wk
Maternal Age at EDD: 23.2 yr
Nuchal Translucency (NT): 1.7 mm
Number of Fetuses: 1
PAPP-A Value: 313.7 ng/mL
Weight: 226 [lb_av]

## 2016-09-18 ENCOUNTER — Other Ambulatory Visit: Payer: Self-pay | Admitting: Women's Health

## 2016-09-18 ENCOUNTER — Ambulatory Visit (INDEPENDENT_AMBULATORY_CARE_PROVIDER_SITE_OTHER): Payer: BLUE CROSS/BLUE SHIELD | Admitting: Women's Health

## 2016-09-18 VITALS — BP 126/72 | HR 88 | Wt 221.0 lb

## 2016-09-18 DIAGNOSIS — Z363 Encounter for antenatal screening for malformations: Secondary | ICD-10-CM

## 2016-09-18 DIAGNOSIS — Z3A16 16 weeks gestation of pregnancy: Secondary | ICD-10-CM

## 2016-09-18 DIAGNOSIS — Z1389 Encounter for screening for other disorder: Secondary | ICD-10-CM

## 2016-09-18 DIAGNOSIS — Z331 Pregnant state, incidental: Secondary | ICD-10-CM

## 2016-09-18 DIAGNOSIS — Z3482 Encounter for supervision of other normal pregnancy, second trimester: Secondary | ICD-10-CM

## 2016-09-18 LAB — POCT URINALYSIS DIPSTICK
Glucose, UA: NEGATIVE
LEUKOCYTES UA: NEGATIVE
Nitrite, UA: NEGATIVE
RBC UA: NEGATIVE

## 2016-09-19 ENCOUNTER — Encounter: Payer: Self-pay | Admitting: Women's Health

## 2016-09-19 NOTE — Progress Notes (Signed)
Low-risk OB appointment G2P1001 568w2d Estimated Date of Delivery: 03/04/17 BP 126/72   Pulse 88   Wt 221 lb (100.2 kg)   LMP 05/28/2016 (Exact Date)   BMI 39.15 kg/m   BP, weight, and urine reviewed.  Refer to obstetrical flow sheet for FH & FHR.  No fm yet. Denies cramping, lof, vb, or uti s/s. LBP- reviewed relief measures.  Reviewed warning s/s to report. Increase po water intake (lg ketones) Plan:  Continue routine obstetrical care  F/U in 2wks for OB appointment and anatomy u/s 2nd IT today **EPIC down entire encounter, documentation completed the next day, was unable to see she needed urine cx poc- will get next visit**

## 2016-09-19 NOTE — Patient Instructions (Signed)

## 2016-09-21 LAB — MATERNAL SCREEN, INTEGRATED #2
AFP MARKER: 24.4 ng/mL
AFP MOM: 1.05
Crown Rump Length: 75.6 mm
DIA MoM: 0.63
DIA VALUE: 85.5 pg/mL
Estriol, Unconjugated: 1.08 ng/mL
GEST. AGE ON COLLECTION DATE: 13.1 wk
GESTATIONAL AGE: 16.1 wk
HCG MOM: 0.41
HCG VALUE: 10.2 [IU]/mL
Maternal Age at EDD: 23.2 yr
NUMBER OF FETUSES: 1
Nuchal Translucency (NT): 1.7 mm
Nuchal Translucency MoM: 0.92
PAPP-A MoM: 0.44
PAPP-A VALUE: 313.7 ng/mL
Test Results:: NEGATIVE
WEIGHT: 221 [lb_av]
WEIGHT: 226 [lb_av]
uE3 MoM: 1.54

## 2016-10-03 ENCOUNTER — Encounter: Payer: Self-pay | Admitting: Women's Health

## 2016-10-03 ENCOUNTER — Ambulatory Visit (INDEPENDENT_AMBULATORY_CARE_PROVIDER_SITE_OTHER): Payer: BLUE CROSS/BLUE SHIELD | Admitting: Women's Health

## 2016-10-03 ENCOUNTER — Ambulatory Visit (INDEPENDENT_AMBULATORY_CARE_PROVIDER_SITE_OTHER): Payer: BLUE CROSS/BLUE SHIELD

## 2016-10-03 VITALS — BP 118/74 | HR 83 | Wt 219.0 lb

## 2016-10-03 DIAGNOSIS — O2341 Unspecified infection of urinary tract in pregnancy, first trimester: Secondary | ICD-10-CM

## 2016-10-03 DIAGNOSIS — Z1389 Encounter for screening for other disorder: Secondary | ICD-10-CM

## 2016-10-03 DIAGNOSIS — O4442 Low lying placenta NOS or without hemorrhage, second trimester: Secondary | ICD-10-CM

## 2016-10-03 DIAGNOSIS — Z331 Pregnant state, incidental: Secondary | ICD-10-CM

## 2016-10-03 DIAGNOSIS — Z3A18 18 weeks gestation of pregnancy: Secondary | ICD-10-CM

## 2016-10-03 DIAGNOSIS — Z3482 Encounter for supervision of other normal pregnancy, second trimester: Secondary | ICD-10-CM

## 2016-10-03 DIAGNOSIS — O4440 Low lying placenta NOS or without hemorrhage, unspecified trimester: Secondary | ICD-10-CM | POA: Insufficient documentation

## 2016-10-03 DIAGNOSIS — O283 Abnormal ultrasonic finding on antenatal screening of mother: Secondary | ICD-10-CM

## 2016-10-03 DIAGNOSIS — O2342 Unspecified infection of urinary tract in pregnancy, second trimester: Secondary | ICD-10-CM

## 2016-10-03 DIAGNOSIS — Z363 Encounter for antenatal screening for malformations: Secondary | ICD-10-CM

## 2016-10-03 LAB — POCT URINALYSIS DIPSTICK
GLUCOSE UA: NEGATIVE
Ketones, UA: NEGATIVE
Leukocytes, UA: NEGATIVE
Nitrite, UA: NEGATIVE
Protein, UA: NEGATIVE
RBC UA: NEGATIVE

## 2016-10-03 NOTE — Progress Notes (Signed)
[place] 18+2 wks,breech,posterior pl gr 0,tip of placenta is 2.4 cm from cx os,cx 4 cm,normal ovaries bilat,svp of fluid 4.7 cm,fhr 152 bpm,LVEICF 1.8 mm,efw 236 g,anatomy complete

## 2016-10-03 NOTE — Patient Instructions (Signed)
For Headaches:   Stay well hydrated, drink enough water so that your urine is clear, sometimes if you are dehydrated you can get headaches  Eat small frequent meals and snacks, sometimes if you are hungry you can get headaches  Sometimes you get headaches during pregnancy from the pregnancy hormones  You can try tylenol (1-2 regular strength 325mg or 1-2 extra strength 500mg) as directed on the box. The least amount of medication that works is best.   Cool compresses (cool wet washcloth or ice pack) to area of head that is hurting  You can also try drinking a caffeinated drink to see if this will help  If not helping, try below:  For Prevention of Headaches/Migraines:  CoQ10 100mg three times daily  Vitamin B2 400mg daily  Magnesium Oxide 400-600mg daily  If You Get a Bad Headache/Migraine:  Benadryl 25mg   Magnesium Oxide  1 large Gatorade  2 extra strength Tylenol (1,000mg total)  1 cup coffee or Coke  If this doesn't help please call us @ [phone]    Second Trimester of Pregnancy The second trimester is from week 14 through week 27 (months 4 through 6). The second trimester is often a time when you feel your best. Your body has adjusted to being pregnant, and you begin to feel better physically. Usually, morning sickness has lessened or quit completely, you may have more energy, and you may have an increase in appetite. The second trimester is also a time when the fetus is growing rapidly. At the end of the sixth month, the fetus is about 9 inches long and weighs about 1 pounds. You will likely begin to feel the baby move (quickening) between 16 and 20 weeks of pregnancy. Body changes during your second trimester Your body continues to go through many changes during your second trimester. The changes vary from woman to woman.  Your weight will continue to increase. You will notice your lower abdomen bulging out.  You may begin to get stretch marks on your hips,  abdomen, and breasts.  You may develop headaches that can be relieved by medicines. The medicines should be approved by your health care provider.  You may urinate more often because the fetus is pressing on your bladder.  You may develop or continue to have heartburn as a result of your pregnancy.  You may develop constipation because certain hormones are causing the muscles that push waste through your intestines to slow down.  You may develop hemorrhoids or swollen, bulging veins (varicose veins).  You may have back pain. This is caused by: ? Weight gain. ? Pregnancy hormones that are relaxing the joints in your pelvis. ? A shift in weight and the muscles that support your balance.  Your breasts will continue to grow and they will continue to become tender.  Your gums may bleed and may be sensitive to brushing and flossing.  Dark spots or blotches (chloasma, mask of pregnancy) may develop on your face. This will likely fade after the baby is born.  A dark line from your belly button to the pubic area (linea nigra) may appear. This will likely fade after the baby is born.  You may have changes in your hair. These can include thickening of your hair, rapid growth, and changes in texture. Some women also have hair loss during or after pregnancy, or hair that feels dry or thin. Your hair will most likely return to normal after your baby is born.  What to expect   at prenatal visits During a routine prenatal visit:  You will be weighed to make sure you and the fetus are growing normally.  Your blood pressure will be taken.  Your abdomen will be measured to track your baby's growth.  The fetal heartbeat will be listened to.  Any test results from the previous visit will be discussed.  Your health care provider may ask you:  How you are feeling.  If you are feeling the baby move.  If you have had any abnormal symptoms, such as leaking fluid, bleeding, severe headaches, or  abdominal cramping.  If you are using any tobacco products, including cigarettes, chewing tobacco, and electronic cigarettes.  If you have any questions.  Other tests that may be performed during your second trimester include:  Blood tests that check for: ? Low iron levels (anemia). ? High blood sugar that affects pregnant women (gestational diabetes) between 24 and 28 weeks. ? Rh antibodies. This is to check for a protein on red blood cells (Rh factor).  Urine tests to check for infections, diabetes, or protein in the urine.  An ultrasound to confirm the proper growth and development of the baby.  An amniocentesis to check for possible genetic problems.  Fetal screens for spina bifida and Down syndrome.  HIV (human immunodeficiency virus) testing. Routine prenatal testing includes screening for HIV, unless you choose not to have this test.  Follow these instructions at home: Medicines  Follow your health care provider's instructions regarding medicine use. Specific medicines may be either safe or unsafe to take during pregnancy.  Take a prenatal vitamin that contains at least 600 micrograms (mcg) of folic acid.  If you develop constipation, try taking a stool softener if your health care provider approves. Eating and drinking  Eat a balanced diet that includes fresh fruits and vegetables, whole grains, good sources of protein such as meat, eggs, or tofu, and low-fat dairy. Your health care provider will help you determine the amount of weight gain that is right for you.  Avoid raw meat and uncooked cheese. These carry germs that can cause birth defects in the baby.  If you have low calcium intake from food, talk to your health care provider about whether you should take a daily calcium supplement.  Limit foods that are high in fat and processed sugars, such as fried and sweet foods.  To prevent constipation: ? Drink enough fluid to keep your urine clear or pale  yellow. ? Eat foods that are high in fiber, such as fresh fruits and vegetables, whole grains, and beans. Activity  Exercise only as directed by your health care provider. Most women can continue their usual exercise routine during pregnancy. Try to exercise for 30 minutes at least 5 days a week. Stop exercising if you experience uterine contractions.  Avoid heavy lifting, wear low heel shoes, and practice good posture.  A sexual relationship may be continued unless your health care provider directs you otherwise. Relieving pain and discomfort  Wear a good support bra to prevent discomfort from breast tenderness.  Take warm sitz baths to soothe any pain or discomfort caused by hemorrhoids. Use hemorrhoid cream if your health care provider approves.  Rest with your legs elevated if you have leg cramps or low back pain.  If you develop varicose veins, wear support hose. Elevate your feet for 15 minutes, 3-4 times a day. Limit salt in your diet. Prenatal Care  Write down your questions. Take them to your prenatal visits.    Keep all your prenatal visits as told by your health care provider. This is important. Safety  Wear your seat belt at all times when driving.  Make a list of emergency phone numbers, including numbers for family, friends, the hospital, and police and fire departments. General instructions  Ask your health care provider for a referral to a local prenatal education class. Begin classes no later than the beginning of month 6 of your pregnancy.  Ask for help if you have counseling or nutritional needs during pregnancy. Your health care provider can offer advice or refer you to specialists for help with various needs.  Do not use hot tubs, steam rooms, or saunas.  Do not douche or use tampons or scented sanitary pads.  Do not cross your legs for long periods of time.  Avoid cat litter boxes and soil used by cats. These carry germs that can cause birth defects in the  baby and possibly loss of the fetus by miscarriage or stillbirth.  Avoid all smoking, herbs, alcohol, and unprescribed drugs. Chemicals in these products can affect the formation and growth of the baby.  Do not use any products that contain nicotine or tobacco, such as cigarettes and e-cigarettes. If you need help quitting, ask your health care provider.  Visit your dentist if you have not gone yet during your pregnancy. Use a soft toothbrush to brush your teeth and be gentle when you floss. Contact a health care provider if:  You have dizziness.  You have mild pelvic cramps, pelvic pressure, or nagging pain in the abdominal area.  You have persistent nausea, vomiting, or diarrhea.  You have a bad smelling vaginal discharge.  You have pain when you urinate. Get help right away if:  You have a fever.  You are leaking fluid from your vagina.  You have spotting or bleeding from your vagina.  You have severe abdominal cramping or pain.  You have rapid weight gain or weight loss.  You have shortness of breath with chest pain.  You notice sudden or extreme swelling of your face, hands, ankles, feet, or legs.  You have not felt your baby move in over an hour.  You have severe headaches that do not go away when you take medicine.  You have vision changes. Summary  The second trimester is from week 14 through week 27 (months 4 through 6). It is also a time when the fetus is growing rapidly.  Your body goes through many changes during pregnancy. The changes vary from woman to woman.  Avoid all smoking, herbs, alcohol, and unprescribed drugs. These chemicals affect the formation and growth your baby.  Do not use any tobacco products, such as cigarettes, chewing tobacco, and e-cigarettes. If you need help quitting, ask your health care provider.  Contact your health care provider if you have any questions. Keep all prenatal visits as told by your health care provider. This is  important. This information is not intended to replace advice given to you by your health care provider. Make sure you discuss any questions you have with your health care provider. Document Released: 01/22/2001 Document Revised: 07/06/2015 Document Reviewed: 03/31/2012 Elsevier Interactive Patient Education  2017 [employer]  

## 2016-10-03 NOTE — Progress Notes (Signed)
Low-risk OB appointment G2P1001 [redacted]w[redacted]d Estimated Date of Delivery: 03/04/17 BP 118/74   Pulse 83   Wt 219 lb (99.3 kg)   LMP 05/28/2016 (Exact Date)   BMI 38.79 kg/m   BP, weight, and urine reviewed.  Refer to obstetrical flow sheet for FH & FHR.  Reports good fm.  Denies regular uc's, lof, vb, or uti s/s. Migraines. Gave printed info on ha prevention/relief, call if not helping.  Reviewed today's anatomy u/s, normal except low-lying placenta 2.4cm from os and isolated Lt EICF- gave printed info, had neg nt/it- no further f/u needed. Pt states her husband has heart condition 'hyperthrombic cardiomyopathy and WPW-discussed don't think we need to do fetal echo, have husband check w/ his md.  Recommended pelvic rest until f/u on LLP @ 26wks Plan:  Continue routine obstetrical care  F/U in 4wks for OB appointment  Urine cx poc today

## 2016-10-04 ENCOUNTER — Encounter: Payer: Self-pay | Admitting: Women's Health

## 2016-10-05 LAB — URINE CULTURE

## 2016-10-23 ENCOUNTER — Telehealth: Payer: Self-pay | Admitting: *Deleted

## 2016-10-23 NOTE — Telephone Encounter (Signed)
Prophylaxis for Lyme is  Doxy X 1 (although Cat D, is Ok for 1 time dose and probably longer) per UptoDate. There is no prophylaxis for RMSF.  Pt to keep abreast of sx (had 2 ticks, both tiny, rash is on legs, not bullseye)

## 2016-10-23 NOTE — Telephone Encounter (Signed)
Patient went to urgent care after pulling a tick off of her. She developed a rash and the area is still red. She was treated for possible Lyme Disease and [place] spotted fever and was prescribed Doxycycline  BID x10 days, Hydroxyzine  1 tab every 6hr as needed, triamcinolone 0.1% topical for itching. Wanted to make sure these were ok to take since she is pregnant.

## 2016-10-28 ENCOUNTER — Telehealth: Payer: Self-pay | Admitting: *Deleted

## 2016-10-28 NOTE — Telephone Encounter (Signed)
Patient pulled 2 ticks off last week. Patient had temp 99.1 last night, took Tylenol. Area is not red but looking better. Had some jaw pain and body aches but not currently. Was advised to call if she had a fever. Informed 99.1 was not febrile but would let you know. Has appointment Thursday. Please advise further if needed.

## 2016-10-31 ENCOUNTER — Ambulatory Visit (INDEPENDENT_AMBULATORY_CARE_PROVIDER_SITE_OTHER): Payer: BLUE CROSS/BLUE SHIELD | Admitting: Advanced Practice Midwife

## 2016-10-31 VITALS — BP 136/80 | HR 95 | Wt 218.0 lb

## 2016-10-31 DIAGNOSIS — Z3482 Encounter for supervision of other normal pregnancy, second trimester: Secondary | ICD-10-CM

## 2016-10-31 DIAGNOSIS — O4440 Low lying placenta NOS or without hemorrhage, unspecified trimester: Secondary | ICD-10-CM

## 2016-10-31 DIAGNOSIS — Z23 Encounter for immunization: Secondary | ICD-10-CM

## 2016-10-31 DIAGNOSIS — Z3A22 22 weeks gestation of pregnancy: Secondary | ICD-10-CM

## 2016-10-31 DIAGNOSIS — Z1389 Encounter for screening for other disorder: Secondary | ICD-10-CM

## 2016-10-31 DIAGNOSIS — Z331 Pregnant state, incidental: Secondary | ICD-10-CM

## 2016-10-31 DIAGNOSIS — O4442 Low lying placenta NOS or without hemorrhage, second trimester: Secondary | ICD-10-CM

## 2016-10-31 LAB — POCT URINALYSIS DIPSTICK
Blood, UA: NEGATIVE
GLUCOSE UA: NEGATIVE
Nitrite, UA: NEGATIVE

## 2016-10-31 NOTE — Patient Instructions (Signed)

## 2016-10-31 NOTE — Progress Notes (Signed)
G2P1001 [redacted]w[redacted]d Estimated Date of Delivery: 03/04/17  Blood pressure 136/80, pulse 95, weight 218 lb (98.9 kg), last menstrual period 05/28/2016.   BP weight and urine results all reviewed and noted.  Please refer to the obstetrical flow sheet for the fundal height and fetal heart rate documentation:  Patient reports good fetal movement, denies any bleeding and no rupture of membranes symptoms or regular contractions. Patient is without complaints. Appetite comes and goes, but feels like she eats as when she wasn't pregnant All questions were answered.  Orders Placed This Encounter  Procedures  . US OB Follow Up  . Flu Vaccine QUAD 36+ mos IM  . POCT Urinalysis Dipstick    Plan:  Continued routine obstetrical care,   Return in about 4 weeks (around 11/28/2016) for PN2/LROB, US:OB F/U:.

## 2016-11-21 ENCOUNTER — Encounter: Payer: Self-pay | Admitting: Women's Health

## 2016-11-21 ENCOUNTER — Ambulatory Visit (INDEPENDENT_AMBULATORY_CARE_PROVIDER_SITE_OTHER): Payer: BLUE CROSS/BLUE SHIELD | Admitting: Women's Health

## 2016-11-21 VITALS — BP 142/70 | HR 111 | Wt 217.0 lb

## 2016-11-21 DIAGNOSIS — R030 Elevated blood-pressure reading, without diagnosis of hypertension: Secondary | ICD-10-CM

## 2016-11-21 DIAGNOSIS — Z3A25 25 weeks gestation of pregnancy: Secondary | ICD-10-CM

## 2016-11-21 DIAGNOSIS — M549 Dorsalgia, unspecified: Secondary | ICD-10-CM

## 2016-11-21 DIAGNOSIS — Z3482 Encounter for supervision of other normal pregnancy, second trimester: Secondary | ICD-10-CM

## 2016-11-21 DIAGNOSIS — Z331 Pregnant state, incidental: Secondary | ICD-10-CM

## 2016-11-21 DIAGNOSIS — O99891 Other specified diseases and conditions complicating pregnancy: Secondary | ICD-10-CM

## 2016-11-21 DIAGNOSIS — Z1389 Encounter for screening for other disorder: Secondary | ICD-10-CM

## 2016-11-21 DIAGNOSIS — O9989 Other specified diseases and conditions complicating pregnancy, childbirth and the puerperium: Secondary | ICD-10-CM

## 2016-11-21 LAB — POCT URINALYSIS DIPSTICK
Blood, UA: NEGATIVE
Glucose, UA: NEGATIVE
Nitrite, UA: NEGATIVE

## 2016-11-21 NOTE — Patient Instructions (Addendum)
You will have your sugar test next visit.  Please do not eat or drink anything after midnight the night before you come, not even water.  You will be here for at least two hours.     Call the office [phone]) or go to [facility] if:  You begin to have strong, frequent contractions  Your water breaks.  Sometimes it is a big gush of fluid, sometimes it is just a trickle that keeps getting your panties wet or running down your legs  You have vaginal bleeding.  It is normal to have a small amount of spotting if your cervix was checked.   You don't feel your baby moving like normal.  If you don't, get you something to eat and drink and lay down and focus on feeling your baby move.  You should feel at least 10 movements in 2 hours.  If you don't, you should call the office or go to [facility].   Call the office [phone]) or go to [facility] hospital for these signs of pre-eclampsia:  Severe headache that does not go away with Tylenol  Visual changes- seeing spots, double, blurred vision  Pain under your right breast or upper abdomen that does not go away with Tums or heartburn medicine  Nausea and/or vomiting  Severe swelling in your hands, feet, and face     For your lower back pain you may:  Purchase a pregnancy belt from [employer] [place], [employer], [employer], etc and wear it while you are up and about  Take warm baths  Use a heating pad to your lower back for no longer than 20 minutes at a time, and do not place near abdomen  Take tylenol as needed. Please follow directions on the bottle  Kinesthesiology tape (can get from sporting goods store), google how to tape belly for pregnancy

## 2016-11-21 NOTE — Progress Notes (Signed)
Work-in    Work-in Low-Risk Pregnancy Visit Patient name: Sydney [name] MRN [id number]  Date of birth: Mar 06, 1995 CC & HPI:  Sydney [name] is a 21 y.o. G2P1001 female at [redacted]w[redacted]d with an Estimated Date of Delivery: 03/04/17 being seen today for ongoing management of a low-risk pregnancy.  Today she reports constant low back pain and intermittent cramping x few days. Denies fever/chills, n/v. Denies any recent change in headaches she has had entire pregnancy, did see spots few weeks ago. Denies ruq/epigastric pain.  Vag. Bleeding: None.  Movement: Present. denies leaking of fluid.  Review of Systems:   Denies abnormal vaginal discharge w/ itching/odor/irritation, headaches, visual changes, shortness of breath, chest pain, abdominal pain, severe nausea/vomiting, or problems with urination or bowel movements unless otherwise stated above. Pertinent History Reviewed:  Reviewed past medical,surgical, social and family history.  Reviewed problem list, medications and allergies. Objective Findings:    Vitals:   11/21/16 1017  BP: (!) 142/70  Pulse: (!) 111  Weight: 217 lb (98.4 kg)  Body mass index is 38.44 kg/m.    General appearance: Well appearing, and in no distress  Mental status: Alert, oriented to person, place, and time  Skin: Warm & dry  Cardiovascular: Normal heart rate noted  Respiratory: Normal respiratory effort, no distress  Abdomen: Soft, gravid  Pelvic: Spec exam: cx visually long & closed, normal appearing/smelling discharge Cervical exam deferred    Extremities: Edema: Trace, DTRs2+, no clonus  Fetal Status: Fetal Heart Rate (bpm): 141 Fundal Height: 25 cm Movement: Present    Results for orders placed or performed in visit on 11/21/16 (from the past 24 hour(s))  POCT urinalysis dipstick   Collection Time: 11/21/16 10:18 AM  Result Value Ref Range   Color, UA     Clarity, UA     Glucose, UA neg    Bilirubin, UA     Ketones, UA trace    Spec Grav, UA  1.010 - 1.025   Blood, UA neg    pH, UA  5.0 - 8.0   Protein, UA trace    Urobilinogen, UA  0.2 or 1.0 E.U./dL   Nitrite, UA neg    Leukocytes, UA Trace (A) Negative    Assessment & Plan:   1) Low-risk pregnancy G2P1001 at [redacted]w[redacted]d with an Estimated Date of Delivery: 03/04/17   2) Low back pain, discussed and gave printed relief measures  3) Intermittent cramping, +ketonuria, increase po hydration, had uti earlier pregnancy- will send urine cx  4) Elevated bp today, no h/o HTN, will check baseline pre-e labs and f/u Mon for bp check. Reviewed pre-e s/s   Plan:  Continue routine obstetrical care   Reviewed: Preterm labor symptoms and general obstetric precautions including but not limited to vaginal bleeding, contractions, leaking of fluid and fetal movement were reviewed in detail with the patient.  All questions were answered  Return for Monday for LROB/bp check.  Orders Placed This Encounter  Procedures  . Urine Culture  . CBC  . Comprehensive metabolic panel  . Protein / creatinine ratio, urine  . POCT urinalysis dipstick   Marge Duncans CNM, [facility] 11/21/2016 11:05 AM

## 2016-11-22 LAB — COMPREHENSIVE METABOLIC PANEL
ALBUMIN: 3.6 g/dL (ref 3.5–5.5)
ALT: 8 IU/L (ref 0–32)
AST: 10 IU/L (ref 0–40)
Albumin/Globulin Ratio: 1.1 — ABNORMAL LOW (ref 1.2–2.2)
Alkaline Phosphatase: 89 IU/L (ref 39–117)
BUN/Creatinine Ratio: 9 (ref 9–23)
BUN: 5 mg/dL — AB (ref 6–20)
Bilirubin Total: 0.2 mg/dL (ref 0.0–1.2)
CALCIUM: 9.4 mg/dL (ref 8.7–10.2)
CO2: 20 mmol/L (ref 20–29)
Chloride: 100 mmol/L (ref 96–106)
Creatinine, Ser: 0.56 mg/dL — ABNORMAL LOW (ref 0.57–1.00)
GFR, EST AFRICAN AMERICAN: 153 mL/min/{1.73_m2} (ref >59)
GFR, EST NON AFRICAN AMERICAN: 133 mL/min/{1.73_m2} (ref >59)
GLUCOSE: 100 mg/dL — AB (ref 65–99)
Globulin, Total: 3.3 g/dL (ref 1.5–4.5)
Potassium: 3.9 mmol/L (ref 3.5–5.2)
Sodium: 137 mmol/L (ref 134–144)
TOTAL PROTEIN: 6.9 g/dL (ref 6.0–8.5)

## 2016-11-22 LAB — CBC
HEMOGLOBIN: 12.3 g/dL (ref 11.1–15.9)
Hematocrit: 37 % (ref 34.0–46.6)
MCH: 26.6 pg (ref 26.6–33.0)
MCHC: 33.2 g/dL (ref 31.5–35.7)
MCV: 80 fL (ref 79–97)
PLATELETS: 224 10*3/uL (ref 150–379)
RBC: 4.63 x10E6/uL (ref 3.77–5.28)
RDW: 14.8 % (ref 12.3–15.4)
WBC: 7 10*3/uL (ref 3.4–10.8)

## 2016-11-22 LAB — PROTEIN / CREATININE RATIO, URINE
Creatinine, Urine: 232.5 mg/dL
PROTEIN/CREAT RATIO: 163 mg/g{creat} (ref 0–200)
Protein, Ur: 37.9 mg/dL

## 2016-11-23 LAB — URINE CULTURE

## 2016-11-25 ENCOUNTER — Encounter: Payer: Self-pay | Admitting: Women's Health

## 2016-11-25 ENCOUNTER — Ambulatory Visit (INDEPENDENT_AMBULATORY_CARE_PROVIDER_SITE_OTHER): Payer: BLUE CROSS/BLUE SHIELD | Admitting: *Deleted

## 2016-11-25 ENCOUNTER — Encounter: Payer: Self-pay | Admitting: *Deleted

## 2016-11-25 VITALS — BP 118/64 | HR 104 | Ht 63.0 in | Wt 219.0 lb

## 2016-11-25 DIAGNOSIS — Z1389 Encounter for screening for other disorder: Secondary | ICD-10-CM

## 2016-11-25 DIAGNOSIS — Z331 Pregnant state, incidental: Secondary | ICD-10-CM

## 2016-11-25 LAB — POCT URINALYSIS DIPSTICK
Blood, UA: NEGATIVE
Glucose, UA: NEGATIVE
LEUKOCYTES UA: NEGATIVE
Nitrite, UA: NEGATIVE
Protein, UA: NEGATIVE

## 2016-11-28 ENCOUNTER — Other Ambulatory Visit: Payer: BLUE CROSS/BLUE SHIELD

## 2016-11-29 ENCOUNTER — Ambulatory Visit (INDEPENDENT_AMBULATORY_CARE_PROVIDER_SITE_OTHER): Payer: BLUE CROSS/BLUE SHIELD

## 2016-11-29 ENCOUNTER — Ambulatory Visit (INDEPENDENT_AMBULATORY_CARE_PROVIDER_SITE_OTHER): Payer: BLUE CROSS/BLUE SHIELD | Admitting: Women's Health

## 2016-11-29 ENCOUNTER — Other Ambulatory Visit: Payer: BLUE CROSS/BLUE SHIELD

## 2016-11-29 ENCOUNTER — Encounter: Payer: Self-pay | Admitting: Women's Health

## 2016-11-29 VITALS — BP 128/78 | HR 100 | Wt 216.0 lb

## 2016-11-29 DIAGNOSIS — O4442 Low lying placenta NOS or without hemorrhage, second trimester: Secondary | ICD-10-CM | POA: Diagnosis not present

## 2016-11-29 DIAGNOSIS — Z3A26 26 weeks gestation of pregnancy: Secondary | ICD-10-CM

## 2016-11-29 DIAGNOSIS — O4440 Low lying placenta NOS or without hemorrhage, unspecified trimester: Secondary | ICD-10-CM

## 2016-11-29 DIAGNOSIS — Z131 Encounter for screening for diabetes mellitus: Secondary | ICD-10-CM

## 2016-11-29 DIAGNOSIS — Z3482 Encounter for supervision of other normal pregnancy, second trimester: Secondary | ICD-10-CM

## 2016-11-29 DIAGNOSIS — Z331 Pregnant state, incidental: Secondary | ICD-10-CM

## 2016-11-29 DIAGNOSIS — Z1389 Encounter for screening for other disorder: Secondary | ICD-10-CM

## 2016-11-29 LAB — POCT URINALYSIS DIPSTICK
Glucose, UA: NEGATIVE
Leukocytes, UA: NEGATIVE
NITRITE UA: NEGATIVE

## 2016-11-29 NOTE — Patient Instructions (Signed)

## 2016-11-29 NOTE — Progress Notes (Signed)
US 26+3 wks,cephalic,cx 3.8 cm,post pl gr 0,resolved low lying placenta,tip of placenta to cx 4.2 cm ,svp of fluid 5.9 cm,LVEICF,EFW 996 g

## 2016-11-29 NOTE — Progress Notes (Signed)
   LOW-RISK PREGNANCY VISIT Patient name: Sydney [name] MRN [id number]  Date of birth: 22-Apr-1995 Chief Complaint:   Routine Prenatal Visit (PN2 & u/s)  History of Present Illness:   Sydney [name] is a 21 y.o. 682P1001 female at 7534w3d with an Estimated Date of Delivery: 03/04/17 being seen today for ongoing management of a low-risk pregnancy.  Today she reports no complaints. Contractions: Not present. Vag. Bleeding: None.  Movement: Present. denies leaking of fluid. Review of Systems:   Pertinent items are noted in HPI Denies abnormal vaginal discharge w/ itching/odor/irritation, headaches, visual changes, shortness of breath, chest pain, abdominal pain, severe nausea/vomiting, or problems with urination or bowel movements unless otherwise stated above. Pertinent History Reviewed:  Reviewed past medical,surgical, social, obstetrical and family history.  Reviewed problem list, medications and allergies. Physical Assessment:   Vitals:   11/29/16 0944  BP: 128/78  Pulse: 100  Weight: 216 lb (98 kg)  Body mass index is 38.26 kg/m.        Physical Examination:   General appearance: Well appearing, and in no distress  Mental status: Alert, oriented to person, place, and time  Skin: Warm & dry  Cardiovascular: Normal heart rate noted  Respiratory: Normal respiratory effort, no distress  Abdomen: Soft, gravid, nontender  Pelvic: Cervical exam deferred         Extremities: Edema: None  Fetal Status: Fetal Heart Rate (bpm): 157 u/s Fundal Height: 26 cm Movement: Present    Today's f/u u/s: US 26+3 wks,cephalic,cx 3.8 cm,post pl gr 0,resolved low lying placenta,tip of placenta to cx 4.2 cm ,svp of fluid 5.9 cm,LVEICF,EFW 996 g,fhr 157 bpm  Results for orders placed or performed in visit on 11/29/16 (from the past 24 hour(s))  POCT urinalysis dipstick   Collection Time: 11/29/16  9:47 AM  Result Value Ref Range   Color, UA     Clarity, UA     Glucose, UA neg    Bilirubin, UA     Ketones, UA large    Spec Grav, UA  1.010 - 1.025   Blood, UA trace    pH, UA  5.0 - 8.0   Protein, UA trace    Urobilinogen, UA  0.2 or 1.0 E.U./dL   Nitrite, UA neg    Leukocytes, UA Negative Negative    Assessment & Plan:  1) Low-risk pregnancy G2P1001 at 9534w3d with an Estimated Date of Delivery: 03/04/17   2) Resolved low-lying placenta   Labs/procedures today: PN2, u/s  Plan:  Continue routine obstetrical care   Reviewed: Preterm labor symptoms and general obstetric precautions including but not limited to vaginal bleeding, contractions, leaking of fluid and fetal movement were reviewed in detail with the patient. Recommended Tdap at HD/PCP per CDC guidelines.  All questions were answered  Follow-up: Return in about 4 weeks (around 12/27/2016) for LROB.  Orders Placed This Encounter  Procedures  . POCT urinalysis dipstick   Marge DuncansBooker, Ashtin Melichar Randall CNM, [facility] 11/29/2016 10:06 AM

## 2016-11-30 LAB — CBC
Hematocrit: 37.7 % (ref 34.0–46.6)
Hemoglobin: 12 g/dL (ref 11.1–15.9)
MCH: 25.6 pg — AB (ref 26.6–33.0)
MCHC: 31.8 g/dL (ref 31.5–35.7)
MCV: 80 fL (ref 79–97)
PLATELETS: 201 10*3/uL (ref 150–379)
RBC: 4.69 x10E6/uL (ref 3.77–5.28)
RDW: 15.1 % (ref 12.3–15.4)
WBC: 5.5 10*3/uL (ref 3.4–10.8)

## 2016-11-30 LAB — GLUCOSE TOLERANCE, 2 HOURS W/ 1HR
GLUCOSE, FASTING: 73 mg/dL (ref 65–91)
Glucose, 1 hour: 94 mg/dL (ref 65–179)
Glucose, 2 hour: 94 mg/dL (ref 65–152)

## 2016-11-30 LAB — ANTIBODY SCREEN: Antibody Screen: NEGATIVE

## 2016-11-30 LAB — HIV ANTIBODY (ROUTINE TESTING W REFLEX): HIV Screen 4th Generation wRfx: NONREACTIVE

## 2016-11-30 LAB — RPR: RPR: NONREACTIVE

## 2016-12-27 ENCOUNTER — Other Ambulatory Visit: Payer: Self-pay

## 2016-12-27 ENCOUNTER — Encounter: Payer: Self-pay | Admitting: Women's Health

## 2016-12-27 ENCOUNTER — Ambulatory Visit (INDEPENDENT_AMBULATORY_CARE_PROVIDER_SITE_OTHER): Payer: BLUE CROSS/BLUE SHIELD | Admitting: Women's Health

## 2016-12-27 VITALS — BP 124/72 | HR 99 | Wt 216.0 lb

## 2016-12-27 DIAGNOSIS — Z3A30 30 weeks gestation of pregnancy: Secondary | ICD-10-CM

## 2016-12-27 DIAGNOSIS — Z331 Pregnant state, incidental: Secondary | ICD-10-CM

## 2016-12-27 DIAGNOSIS — Z1389 Encounter for screening for other disorder: Secondary | ICD-10-CM

## 2016-12-27 DIAGNOSIS — Z3483 Encounter for supervision of other normal pregnancy, third trimester: Secondary | ICD-10-CM

## 2016-12-27 LAB — POCT URINALYSIS DIPSTICK
Blood, UA: NEGATIVE
Glucose, UA: NEGATIVE
LEUKOCYTES UA: NEGATIVE
NITRITE UA: NEGATIVE

## 2016-12-27 NOTE — Patient Instructions (Signed)
Sydney [name], I greatly value your feedback.  If you receive a survey following your visit with us today, we appreciate you taking the time to fill it out.  Thanks, Joellyn HaffKim Virginio Isidore, CNM, WHNP-BC   Call the office [phone]) or go to [facility] if:  You begin to have strong, frequent contractions  Your water breaks.  Sometimes it is a big gush of fluid, sometimes it is just a trickle that keeps getting your panties wet or running down your legs  You have vaginal bleeding.  It is normal to have a small amount of spotting if your cervix was checked.   You don't feel your baby moving like normal.  If you don't, get you something to eat and drink and lay down and focus on feeling your baby move.  You should feel at least 10 movements in 2 hours.  If you don't, you should call the office or go to [facility].    Tdap Vaccine  It is recommended that you get the Tdap vaccine during the third trimester of EACH pregnancy to help protect your baby from getting pertussis (whooping cough)  27-36 weeks is the BEST time to do this so that you can pass the protection on to your baby. During pregnancy is better than after pregnancy, but if you are unable to get it during pregnancy it will be offered at the hospital.   You can get this vaccine at the health department or your family doctor  Everyone who will be around your baby should also be up-to-date on their vaccines. Adults (who are not pregnant) only need 1 dose of Tdap during adulthood.   Third Trimester of Pregnancy The third trimester is from week 29 through week 42, months 7 through 9. The third trimester is a time when the fetus is growing rapidly. At the end of the ninth month, the fetus is about 20 inches in length and weighs 6-10 pounds.  BODY CHANGES Your body goes through many changes during pregnancy. The changes vary from woman to woman.   Your weight will continue to increase. You can expect to gain 25-35 pounds (11-16 kg) by the  end of the pregnancy.  You may begin to get stretch marks on your hips, abdomen, and breasts.  You may urinate more often because the fetus is moving lower into your pelvis and pressing on your bladder.  You may develop or continue to have heartburn as a result of your pregnancy.  You may develop constipation because certain hormones are causing the muscles that push waste through your intestines to slow down.  You may develop hemorrhoids or swollen, bulging veins (varicose veins).  You may have pelvic pain because of the weight gain and pregnancy hormones relaxing your joints between the bones in your pelvis. Backaches may result from overexertion of the muscles supporting your posture.  You may have changes in your hair. These can include thickening of your hair, rapid growth, and changes in texture. Some women also have hair loss during or after pregnancy, or hair that feels dry or thin. Your hair will most likely return to normal after your baby is born.  Your breasts will continue to grow and be tender. A yellow discharge may leak from your breasts called colostrum.  Your belly button may stick out.  You may feel short of breath because of your expanding uterus.  You may notice the fetus "dropping," or moving lower in your abdomen.  You may have a bloody mucus  discharge. This usually occurs a few days to a week before labor begins.  Your cervix becomes thin and soft (effaced) near your due date. WHAT TO EXPECT AT YOUR PRENATAL EXAMS  You will have prenatal exams every 2 weeks until week 36. Then, you will have weekly prenatal exams. During a routine prenatal visit:  You will be weighed to make sure you and the fetus are growing normally.  Your blood pressure is taken.  Your abdomen will be measured to track your baby's growth.  The fetal heartbeat will be listened to.  Any test results from the previous visit will be discussed.  You may have a cervical check near your due  date to see if you have effaced. At around 36 weeks, your caregiver will check your cervix. At the same time, your caregiver will also perform a test on the secretions of the vaginal tissue. This test is to determine if a type of bacteria, Group B streptococcus, is present. Your caregiver will explain this further. Your caregiver may ask you:  What your birth plan is.  How you are feeling.  If you are feeling the baby move.  If you have had any abnormal symptoms, such as leaking fluid, bleeding, severe headaches, or abdominal cramping.  If you have any questions. Other tests or screenings that may be performed during your third trimester include:  Blood tests that check for low iron levels (anemia).  Fetal testing to check the health, activity level, and growth of the fetus. Testing is done if you have certain medical conditions or if there are problems during the pregnancy. FALSE LABOR You may feel small, irregular contractions that eventually go away. These are called Braxton Hicks contractions, or false labor. Contractions may last for hours, days, or even weeks before true labor sets in. If contractions come at regular intervals, intensify, or become painful, it is best to be seen by your caregiver.  SIGNS OF LABOR   Menstrual-like cramps.  Contractions that are 5 minutes apart or less.  Contractions that start on the top of the uterus and spread down to the lower abdomen and back.  A sense of increased pelvic pressure or back pain.  A watery or bloody mucus discharge that comes from the vagina. If you have any of these signs before the 37th week of pregnancy, call your caregiver right away. You need to go to the hospital to get checked immediately. HOME CARE INSTRUCTIONS   Avoid all smoking, herbs, alcohol, and unprescribed drugs. These chemicals affect the formation and growth of the baby.  Follow your caregiver's instructions regarding medicine use. There are medicines that  are either safe or unsafe to take during pregnancy.  Exercise only as directed by your caregiver. Experiencing uterine cramps is a good sign to stop exercising.  Continue to eat regular, healthy meals.  Wear a good support bra for breast tenderness.  Do not use hot tubs, steam rooms, or saunas.  Wear your seat belt at all times when driving.  Avoid raw meat, uncooked cheese, cat litter boxes, and soil used by cats. These carry germs that can cause birth defects in the baby.  Take your prenatal vitamins.  Try taking a stool softener (if your caregiver approves) if you develop constipation. Eat more high-fiber foods, such as fresh vegetables or fruit and whole grains. Drink plenty of fluids to keep your urine clear or pale yellow.  Take warm sitz baths to soothe any pain or discomfort caused by hemorrhoids. Use  hemorrhoid cream if your caregiver approves.  If you develop varicose veins, wear support hose. Elevate your feet for 15 minutes, 3-4 times a day. Limit salt in your diet.  Avoid heavy lifting, wear low heal shoes, and practice good posture.  Rest a lot with your legs elevated if you have leg cramps or low back pain.  Visit your dentist if you have not gone during your pregnancy. Use a soft toothbrush to brush your teeth and be gentle when you floss.  A sexual relationship may be continued unless your caregiver directs you otherwise.  Do not travel far distances unless it is absolutely necessary and only with the approval of your caregiver.  Take prenatal classes to understand, practice, and ask questions about the labor and delivery.  Make a trial run to the hospital.  Pack your hospital bag.  Prepare the baby's nursery.  Continue to go to all your prenatal visits as directed by your caregiver. SEEK MEDICAL CARE IF:  You are unsure if you are in labor or if your water has broken.  You have dizziness.  You have mild pelvic cramps, pelvic pressure, or nagging pain  in your abdominal area.  You have persistent nausea, vomiting, or diarrhea.  You have a bad smelling vaginal discharge.  You have pain with urination. SEEK IMMEDIATE MEDICAL CARE IF:   You have a fever.  You are leaking fluid from your vagina.  You have spotting or bleeding from your vagina.  You have severe abdominal cramping or pain.  You have rapid weight loss or gain.  You have shortness of breath with chest pain.  You notice sudden or extreme swelling of your face, hands, ankles, feet, or legs.  You have not felt your baby move in over an hour.  You have severe headaches that do not go away with medicine.  You have vision changes. Document Released: 01/22/2001 Document Revised: 02/02/2013 Document Reviewed: 03/31/2012 [facility] Patient Information 2015 [place], [place]. This information is not intended to replace advice given to you by your health care provider. Make sure you discuss any questions you have with your health care provider.

## 2016-12-27 NOTE — Progress Notes (Signed)
   LOW-RISK PREGNANCY VISIT Patient name: Sydney [name] Cancio MRN [id number]  Date of birth: 1996-01-21 Chief Complaint:   Routine Prenatal Visit  History of Present Illness:   Sydney [name] Kunda is a 21 y.o. 402P1001 female at 4365w3d with an Estimated Date of Delivery: 03/04/17 being seen today for ongoing management of a low-risk pregnancy.  Today she reports had RUQ that radiated into Rt shoulder the other week, lasted for alittle while and finally went away. Reminded her of gb pain she had during last pregnancy. Has since had cholecystectomy. Denies relation to eating. . Contractions: Irregular. Vag. Bleeding: None.  Movement: Present. denies leaking of fluid. Review of Systems:   Pertinent items are noted in HPI Denies abnormal vaginal discharge w/ itching/odor/irritation, headaches, visual changes, shortness of breath, chest pain, abdominal pain, severe nausea/vomiting, or problems with urination or bowel movements unless otherwise stated above. Pertinent History Reviewed:  Reviewed past medical,surgical, social, obstetrical and family history.  Reviewed problem list, medications and allergies. Physical Assessment:   Vitals:   12/27/16 1205  BP: 124/72  Pulse: 99  Weight: 216 lb (98 kg)  Body mass index is 38.26 kg/m.        Physical Examination:   General appearance: Well appearing, and in no distress  Mental status: Alert, oriented to person, place, and time  Skin: Warm & dry  Cardiovascular: Normal heart rate noted  Respiratory: Normal respiratory effort, no distress  Abdomen: Soft, gravid, nontender  Pelvic: Cervical exam deferred         Extremities: Edema: Trace  Fetal Status: Fetal Heart Rate (bpm): 160 Fundal Height: 28 cm Movement: Present    Results for orders placed or performed in visit on 12/27/16 (from the past 24 hour(s))  POCT urinalysis dipstick   Collection Time: 12/27/16 12:07 PM  Result Value Ref Range   Color, UA     Clarity, UA     Glucose, UA neg    Bilirubin,  UA     Ketones, UA small    Spec Grav, UA  1.010 - 1.025   Blood, UA neg    pH, UA  5.0 - 8.0   Protein, UA trace    Urobilinogen, UA  0.2 or 1.0 E.U./dL   Nitrite, UA neg    Leukocytes, UA Negative Negative    Assessment & Plan:  1) Low-risk pregnancy G2P1001 at 8165w3d with an Estimated Date of Delivery: 03/04/17   2) RUQ pain, not current, h/o cholecystectomy- reminded her of gb pain she felt in last pregnancy. Discussed can possible get stones in sph[place], let us know/go to [facility] if pain returns and is not going away, any n/v, fever/chills associated w/ it   Labs/procedures today: none  Plan:  Continue routine obstetrical care   Reviewed: Preterm labor symptoms and general obstetric precautions including but not limited to vaginal bleeding, contractions, leaking of fluid and fetal movement were reviewed in detail with the patient. Recommended Tdap at HD/PCP per CDC guidelines.  All questions were answered  Follow-up: Return in about 2 weeks (around 01/10/2017) for LROB.  Orders Placed This Encounter  Procedures  . POCT urinalysis dipstick   Marge DuncansBooker, Kimberly Randall CNM, [facility] 12/27/2016 12:44 PM

## 2017-01-10 ENCOUNTER — Ambulatory Visit (INDEPENDENT_AMBULATORY_CARE_PROVIDER_SITE_OTHER): Payer: BLUE CROSS/BLUE SHIELD | Admitting: Women's Health

## 2017-01-10 ENCOUNTER — Encounter: Payer: Self-pay | Admitting: Women's Health

## 2017-01-10 VITALS — BP 120/64 | HR 96 | Wt 216.0 lb

## 2017-01-10 DIAGNOSIS — Z3A32 32 weeks gestation of pregnancy: Secondary | ICD-10-CM

## 2017-01-10 DIAGNOSIS — Z331 Pregnant state, incidental: Secondary | ICD-10-CM

## 2017-01-10 DIAGNOSIS — Z3483 Encounter for supervision of other normal pregnancy, third trimester: Secondary | ICD-10-CM

## 2017-01-10 DIAGNOSIS — Z1389 Encounter for screening for other disorder: Secondary | ICD-10-CM

## 2017-01-10 LAB — POCT URINALYSIS DIPSTICK
GLUCOSE UA: NEGATIVE
Ketones, UA: NEGATIVE
Leukocytes, UA: NEGATIVE
NITRITE UA: NEGATIVE
RBC UA: NEGATIVE

## 2017-01-10 NOTE — Patient Instructions (Addendum)
Sydney [name], I greatly value your feedback.  If you receive a survey following your visit with us today, we appreciate you taking the time to fill it out.  Thanks, Sydney [name], CNM, WHNP-BC   Call the office [phone]) or go to [facility] if:  You begin to have strong, frequent contractions  Your water breaks.  Sometimes it is a big gush of fluid, sometimes it is just a trickle that keeps getting your panties wet or running down your legs  You have vaginal bleeding.  It is normal to have a small amount of spotting if your cervix was checked.   You don't feel your baby moving like normal.  If you don't, get you something to eat and drink and lay down and focus on feeling your baby move.  You should feel at least 10 movements in 2 hours.  If you don't, you should call the office or [facility] to [facility].     Preterm Labor and Birth Information The normal length of a pregnancy is 39-41 weeks. Preterm labor is when labor starts before 37 completed weeks of pregnancy. What are the risk factors for preterm labor? Preterm labor is more likely to occur in women who:  Have certain infections during pregnancy such as a bladder infection, sexually transmitted infection, or infection inside the uterus (chorioamnionitis).  Have a shorter-than-normal cervix.  Have gone into preterm labor before.  Have had surgery on their cervix.  Are younger than age 23 or older than age 23.  Are African American.  Are pregnant with twins or multiple babies (multiple gestation).  Take street drugs or smoke while pregnant.  Do not gain enough weight while pregnant.  Became pregnant shortly after having been pregnant.  What are the symptoms of preterm labor? Symptoms of preterm labor include:  Cramps similar to those that can happen during a menstrual period. The cramps may happen with diarrhea.  Pain in the abdomen or lower back.  Regular uterine contractions that may feel like tightening of  the abdomen.  A feeling of increased pressure in the pelvis.  Increased watery or bloody mucus discharge from the vagina.  Water breaking (ruptured amniotic sac).  Why is it important to recognize signs of preterm labor? It is important to recognize signs of preterm labor because babies who are born prematurely may not be fully developed. This can put them at an increased risk for:  Long-term (chronic) heart and lung problems.  Difficulty immediately after birth with regulating body systems, including blood sugar, body temperature, heart rate, and breathing rate.  Bleeding in the brain.  Cerebral palsy.  Learning difficulties.  Death.  These risks are highest for babies who are born before 34 weeks of pregnancy. How is preterm labor treated? Treatment depends on the length of your pregnancy, your condition, and the health of your baby. It may involve:  Having a stitch (suture) placed in your cervix to prevent your cervix from opening too early (cerclage).  Taking or being given medicines, such as: ? Hormone medicines. These may be given early in pregnancy to help support the pregnancy. ? Medicine to stop contractions. ? Medicines to help mature the baby's lungs. These may be prescribed if the risk of delivery is high. ? Medicines to prevent your baby from developing cerebral palsy.  If the labor happens before 34 weeks of pregnancy, you may need to stay in the hospital. What should I do if I think I am in preterm labor? If you  think that you are going into preterm labor, call your health care provider right away. How can I prevent preterm labor in future pregnancies? To increase your chance of having a full-term pregnancy:  Do not use any tobacco products, such as cigarettes, chewing tobacco, and e-cigarettes. If you need help quitting, ask your health care provider.  Do not use street drugs or medicines that have not been prescribed to you during your pregnancy.  Talk  with your health care provider before taking any herbal supplements, even if you have been taking them regularly.  Make sure you gain a healthy amount of weight during your pregnancy.  Watch for infection. If you think that you might have an infection, get it checked right away.  Make sure to tell your health care provider if you have gone into preterm labor before.  This information is not intended to replace advice given to you by your health care provider. Make sure you discuss any questions you have with your health care provider. Document Released: 04/20/2003 Document Revised: 07/11/2015 Document Reviewed: 06/21/2015 Elsevier Interactive Patient Education  2018 [employer].   Heartburn During Pregnancy Heartburn is a type of pain or discomfort that can happen in the throat or chest. It is often described as a burning sensation. Heartburn is common during pregnancy because:  A hormone (progesterone) that is released during pregnancy may relax the valve (lower esophageal sphincter, or LES) that separates the esophagus from the stomach. This allows stomach acid to move up into the esophagus, causing heartburn.  The uterus gets larger and pushes up on the stomach, which pushes more acid into the esophagus. This is especially true in the later stages of pregnancy.  Heartburn usually goes away or gets better after giving birth. What are the causes? Heartburn is caused by stomach acid backing up into the esophagus (reflux). Reflux can be triggered by:  Changing hormone levels.  Large meals.  Certain foods and beverages, such as coffee, chocolate, onions, and peppermint.  Exercise.  Increased stomach acid production.  What increases the risk? You are more likely to experience heartburn during pregnancy if you:  Had heartburn prior to becoming pregnant.  Have been pregnant more than once before.  Are overweight or obese.  The likelihood that you will get heartburn also  increases as you get farther along in your pregnancy, especially during the last trimester. What are the signs or symptoms? Symptoms of this condition include:  Burning pain in the chest or lower throat.  Bitter taste in the mouth.  Coughing.  Problems swallowing.  Vomiting.  Hoarse voice.  Asthma.  Symptoms may get worse when you lie down or bend over. Symptoms are often worse at night. How is this diagnosed? This condition is diagnosed based on:  Your medical history.  Your symptoms.  Blood tests to check for a certain type of bacteria associated with heartburn.  Whether taking heartburn medicine relieves your symptoms.  Examination of the stomach and esophagus using a tube with a light and camera on the end (endoscopy).  How is this treated? Treatment varies depending on how severe your symptoms are. Your health care provider may recommend:  Over-the-counter medicines (antacids or acid reducers) for mild heartburn.  Prescription medicines to decrease stomach acid or to protect your stomach lining.  Certain changes in your diet.  Raising the head of your bed so it is higher than the foot of the bed. This helps prevent stomach acid from backing up into the esophagus  when you are lying down.  Follow these instructions at home: Eating and drinking  Do not drink alcohol during your pregnancy.  Identify foods and beverages that make your symptoms worse, and avoid them.  Beverages that you may want to avoid include: ? Coffee and tea (with or without caffeine). ? Energy drinks and sports drinks. ? Carbonated drinks or sodas. ? Citrus fruit juices.  Foods that you may want to avoid include: ? Chocolate and cocoa. ? Peppermint and mint flavorings. ? Garlic, onions, and horseradish. ? Spicy and acidic foods, including peppers, chili powder, curry powder, vinegar, hot sauces, and barbecue sauce. ? Citrus fruits, such as oranges, lemons, and limes. ? Tomato-based  foods, such as red sauce, chili, and salsa. ? Fried and fatty foods, such as donuts, french fries, potato chips, and high-fat dressings. ? High-fat meats, such as hot dogs, cold cuts, sausage, ham, and bacon. ? High-fat dairy items, such as whole milk, butter, and cheese.  Eat small, frequent meals instead of large meals.  Avoid drinking large amounts of liquid with your meals.  Avoid eating meals during the 2-3 hours before bedtime.  Avoid lying down right after you eat.  Do not exercise right after you eat. Medicines  Take over-the-counter and prescription medicines only as told by your health care provider.  Do not take aspirin, ibuprofen, or other NSAIDs unless your health care provider tells you to do that.  You may be instructed to avoid medicines that contain sodium bicarbonate. General instructions  If directed, raise the head of your bed about 6 inches (15 cm) by putting blocks under the legs. Sleeping with more pillows does not effectively relieve heartburn because it only changes the position of your head.  Do not use any products that contain nicotine or tobacco, such as cigarettes and e-cigarettes. If you need help quitting, ask your health care provider.  Wear loose-fitting clothing.  Try to reduce your stress, such as with yoga or meditation. If you need help managing stress, ask your health care provider.  Maintain a healthy weight. If you are overweight, work with your health care provider to safely lose weight.  Keep all follow-up visits as told by your health care provider. This is important. Contact a health care provider if:  You develop new symptoms.  Your symptoms do not improve with treatment.  You have unexplained weight loss.  You have difficulty swallowing.  You make loud sounds when you breathe (wheeze).  You have a cough that does not go away.  You have frequent heartburn for more than 2 weeks.  You have nausea or vomiting that does not  get better with treatment.  You have pain in your abdomen. Get help right away if:  You have severe chest pain that spreads to your arm, neck, or jaw.  You feel sweaty, dizzy, or light-headed.  You have shortness of breath.  You have pain when swallowing.  You vomit, and your vomit looks like blood or coffee grounds.  Your stool is bloody or black. This information is not intended to replace advice given to you by your health care provider. Make sure you discuss any questions you have with your health care provider. Document Released: 01/26/2000 Document Revised: 10/16/2015 Document Reviewed: 10/16/2015 Elsevier Interactive Patient Education  2018 [employer]

## 2017-01-10 NOTE — Progress Notes (Signed)
   LOW-RISK PREGNANCY VISIT Patient name: Sydney [name] Labuda MRN [id number]  Date of birth: 1995/10/29 Chief Complaint:   Routine Prenatal Visit (acid reflux)  History of Present Illness:   Sydney [name] Casady is a 21 y.o. 382P1001 female at 813w3d with an Estimated Date of Delivery: 03/04/17 being seen today for ongoing management of a low-risk pregnancy.  Today she reports reflux- hasn't tried anything, doesn't like taking meds. Contractions: Irregular. Vag. Bleeding: None.  Movement: Present. denies leaking of fluid. Review of Systems:   Pertinent items are noted in HPI Denies abnormal vaginal discharge w/ itching/odor/irritation, headaches, visual changes, shortness of breath, chest pain, abdominal pain, severe nausea/vomiting, or problems with urination or bowel movements unless otherwise stated above. Pertinent History Reviewed:  Reviewed past medical,surgical, social, obstetrical and family history.  Reviewed problem list, medications and allergies. Physical Assessment:   Vitals:   01/10/17 1210  BP: 120/64  Pulse: 96  Weight: 216 lb (98 kg)  Body mass index is 38.26 kg/m.        Physical Examination:   General appearance: Well appearing, and in no distress  Mental status: Alert, oriented to person, place, and time  Skin: Warm & dry  Cardiovascular: Normal heart rate noted  Respiratory: Normal respiratory effort, no distress  Abdomen: Soft, gravid, nontender  Pelvic: Cervical exam deferred         Extremities: Edema: None  Fetal Status: Fetal Heart Rate (bpm): 157 Fundal Height: 32 cm Movement: Present    Results for orders placed or performed in visit on 01/10/17 (from the past 24 hour(s))  POCT urinalysis dipstick   Collection Time: 01/10/17 12:11 PM  Result Value Ref Range   Color, UA     Clarity, UA     Glucose, UA neg    Bilirubin, UA     Ketones, UA neg    Spec Grav, UA  1.010 - 1.025   Blood, UA neg    pH, UA  5.0 - 8.0   Protein, UA trace    Urobilinogen, UA  0.2 or  1.0 E.U./dL   Nitrite, UA neg    Leukocytes, UA Negative Negative    Assessment & Plan:  1) Low-risk pregnancy G2P1001 at 763w3d with an Estimated Date of Delivery: 03/04/17    Labs/procedures today: none  Plan:  Continue routine obstetrical care   Reviewed: Preterm labor symptoms and general obstetric precautions including but not limited to vaginal bleeding, contractions, leaking of fluid and fetal movement were reviewed in detail with the patient.  All questions were answered  Follow-up: Return in about 2 weeks (around 01/24/2017) for LROB.  Orders Placed This Encounter  Procedures  . POCT urinalysis dipstick   Marge DuncansBooker, Adonus Uselman Randall CNM, [facility] 01/10/2017 12:35 PM

## 2017-01-24 ENCOUNTER — Ambulatory Visit (INDEPENDENT_AMBULATORY_CARE_PROVIDER_SITE_OTHER): Payer: BLUE CROSS/BLUE SHIELD | Admitting: Women's Health

## 2017-01-24 ENCOUNTER — Encounter: Payer: Self-pay | Admitting: Women's Health

## 2017-01-24 VITALS — BP 110/60 | HR 109 | Temp 97.7°F | Wt 214.5 lb

## 2017-01-24 DIAGNOSIS — Z3483 Encounter for supervision of other normal pregnancy, third trimester: Secondary | ICD-10-CM

## 2017-01-24 DIAGNOSIS — J019 Acute sinusitis, unspecified: Secondary | ICD-10-CM

## 2017-01-24 DIAGNOSIS — Z3A34 34 weeks gestation of pregnancy: Secondary | ICD-10-CM

## 2017-01-24 DIAGNOSIS — Z331 Pregnant state, incidental: Secondary | ICD-10-CM

## 2017-01-24 DIAGNOSIS — Z1389 Encounter for screening for other disorder: Secondary | ICD-10-CM

## 2017-01-24 LAB — POCT URINALYSIS DIPSTICK
Glucose, UA: NEGATIVE
LEUKOCYTES UA: NEGATIVE
NITRITE UA: NEGATIVE
PROTEIN UA: NEGATIVE
RBC UA: NEGATIVE

## 2017-01-24 MED ORDER — AMOXICILLIN-POT CLAVULANATE 875-125 MG PO TABS
1.0000 | ORAL_TABLET | Freq: Two times a day (BID) | ORAL | 0 refills | Status: DC
Start: 2017-01-24 — End: 2017-02-07

## 2017-01-24 NOTE — Patient Instructions (Signed)
Rolena InfanteKayla Cordon, I greatly value your feedback.  If you receive a survey following your visit with us today, we appreciate you taking the time to fill it out.  Thanks, Joellyn HaffKim Booker, CNM, WHNP-BC   Call the office [phone]) or [facility] to [facility] if:  You begin to have strong, frequent contractions  Your water breaks.  Sometimes it is a big gush of fluid, sometimes it is just a trickle that keeps getting your panties wet or running down your legs  You have vaginal bleeding.  It is normal to have a small amount of spotting if your cervix was checked.   You don't feel your baby moving like normal.  If you don't, get you something to eat and drink and lay down and focus on feeling your baby move.  You should feel at least 10 movements in 2 hours.  If you don't, you should call the office or [facility] to [facility].    Preterm Labor and Birth Information The normal length of a pregnancy is 39-41 weeks. Preterm labor is when labor starts before 37 completed weeks of pregnancy. What are the risk factors for preterm labor? Preterm labor is more likely to occur in women who:  Have certain infections during pregnancy such as a bladder infection, sexually transmitted infection, or infection inside the uterus (chorioamnionitis).  Have a shorter-than-normal cervix.  Have gone into preterm labor before.  Have had surgery on their cervix.  Are younger than age 23 or older than age 23.  Are African American.  Are pregnant with twins or multiple babies (multiple gestation).  Take street drugs or smoke while pregnant.  Do not gain enough weight while pregnant.  Became pregnant shortly after having been pregnant.  What are the symptoms of preterm labor? Symptoms of preterm labor include:  Cramps similar to those that can happen during a menstrual period. The cramps may happen with diarrhea.  Pain in the abdomen or lower back.  Regular uterine contractions that may feel like tightening of  the abdomen.  A feeling of increased pressure in the pelvis.  Increased watery or bloody mucus discharge from the vagina.  Water breaking (ruptured amniotic sac).  Why is it important to recognize signs of preterm labor? It is important to recognize signs of preterm labor because babies who are born prematurely may not be fully developed. This can put them at an increased risk for:  Long-term (chronic) heart and lung problems.  Difficulty immediately after birth with regulating body systems, including blood sugar, body temperature, heart rate, and breathing rate.  Bleeding in the brain.  Cerebral palsy.  Learning difficulties.  Death.  These risks are highest for babies who are born before 34 weeks of pregnancy. How is preterm labor treated? Treatment depends on the length of your pregnancy, your condition, and the health of your baby. It may involve:  Having a stitch (suture) placed in your cervix to prevent your cervix from opening too early (cerclage).  Taking or being given medicines, such as: ? Hormone medicines. These may be given early in pregnancy to help support the pregnancy. ? Medicine to stop contractions. ? Medicines to help mature the baby's lungs. These may be prescribed if the risk of delivery is high. ? Medicines to prevent your baby from developing cerebral palsy.  If the labor happens before 34 weeks of pregnancy, you may need to stay in the hospital. What should I do if I think I am in preterm labor? If you think  that you are going into preterm labor, call your health care provider right away. How can I prevent preterm labor in future pregnancies? To increase your chance of having a full-term pregnancy:  Do not use any tobacco products, such as cigarettes, chewing tobacco, and e-cigarettes. If you need help quitting, ask your health care provider.  Do not use street drugs or medicines that have not been prescribed to you during your pregnancy.  Talk  with your health care provider before taking any herbal supplements, even if you have been taking them regularly.  Make sure you gain a healthy amount of weight during your pregnancy.  Watch for infection. If you think that you might have an infection, get it checked right away.  Make sure to tell your health care provider if you have gone into preterm labor before.  This information is not intended to replace advice given to you by your health care provider. Make sure you discuss any questions you have with your health care provider. Document Released: 04/20/2003 Document Revised: 07/11/2015 Document Reviewed: 06/21/2015 Elsevier Interactive Patient Education  2018 [employer]

## 2017-01-24 NOTE — Progress Notes (Signed)
   LOW-RISK PREGNANCY VISIT Patient name: Sydney [name] MRN [id number]  Date of birth: October 23, 1995 Chief Complaint:   Routine Prenatal Visit (pelvic pressure; stuffy nose over a week; face hurts )  History of Present Illness:   Sydney [name] is a 21 y.o. 372P1001 female at 5566w3d with an Estimated Date of Delivery: 03/04/17 being seen today for ongoing management of a low-risk pregnancy.  Today she reports cold sx x 1-1.5wks, not getting any better. Main complaint congestion and facial/sinus pressure. Denies fever/chills. Some bilateral ear pain and throat pain. Mild cough. Contractions: Irregular. Vag. Bleeding: None.  Movement: Present. denies leaking of fluid. Review of Systems:   Pertinent items are noted in HPI Denies abnormal vaginal discharge w/ itching/odor/irritation, headaches, visual changes, shortness of breath, chest pain, abdominal pain, severe nausea/vomiting, or problems with urination or bowel movements unless otherwise stated above. Pertinent History Reviewed:  Reviewed past medical,surgical, social, obstetrical and family history.  Reviewed problem list, medications and allergies. Physical Assessment:   Vitals:   01/24/17 1148  BP: 110/60  Pulse: (!) 109  Temp: 97.7 F (36.5 C)  Weight: 214 lb 8 oz (97.3 kg)  Body mass index is 38 kg/m.        Physical Examination:   General appearance: Well appearing, and in no distress  Mental status: Alert, oriented to person, place, and time  Skin: Warm & dry  Face: + Sinus pressure  Ears: normal, no s/s infection  Throat: normal, no erythema/exudte  Cardiovascular: Normal heart rate noted  Respiratory: Normal respiratory effort, no distress  Abdomen: Soft, gravid, nontender  Pelvic: Cervical exam deferred         Extremities: Edema: None  Fetal Status:     Movement: Present    Results for orders placed or performed in visit on 01/24/17 (from the past 24 hour(s))  POCT urinalysis dipstick   Collection Time: 01/24/17 11:50  AM  Result Value Ref Range   Color, UA     Clarity, UA     Glucose, UA neg    Bilirubin, UA     Ketones, UA 1+    Spec Grav, UA  1.010 - 1.025   Blood, UA neg    pH, UA  5.0 - 8.0   Protein, UA neg    Urobilinogen, UA  0.2 or 1.0 E.U./dL   Nitrite, UA neg    Leukocytes, UA Negative Negative   Appearance     Odor      Assessment & Plan:  1) Low-risk pregnancy G2P1001 at 4166w3d with an Estimated Date of Delivery: 03/04/17   2) Sinusitis, rx augmentin   Labs/procedures today: none  Plan:  Continue routine obstetrical care   Reviewed: Preterm labor symptoms and general obstetric precautions including but not limited to vaginal bleeding, contractions, leaking of fluid and fetal movement were reviewed in detail with the patient.  All questions were answered  Follow-up: Return in about 2 weeks (around 02/07/2017) for LROB.  Orders Placed This Encounter  Procedures  . POCT urinalysis dipstick   Marge DuncansBooker, Kimberly Randall CNM, [facility]BC 01/24/2017 12:10 PM

## 2017-02-07 ENCOUNTER — Ambulatory Visit (INDEPENDENT_AMBULATORY_CARE_PROVIDER_SITE_OTHER): Payer: BLUE CROSS/BLUE SHIELD | Admitting: Women's Health

## 2017-02-07 ENCOUNTER — Encounter: Payer: Self-pay | Admitting: Women's Health

## 2017-02-07 VITALS — BP 130/78 | HR 84 | Wt 220.0 lb

## 2017-02-07 DIAGNOSIS — Z331 Pregnant state, incidental: Secondary | ICD-10-CM

## 2017-02-07 DIAGNOSIS — Z1389 Encounter for screening for other disorder: Secondary | ICD-10-CM

## 2017-02-07 DIAGNOSIS — Z3483 Encounter for supervision of other normal pregnancy, third trimester: Secondary | ICD-10-CM

## 2017-02-07 DIAGNOSIS — Z3A36 36 weeks gestation of pregnancy: Secondary | ICD-10-CM

## 2017-02-07 LAB — POCT URINALYSIS DIPSTICK
Blood, UA: NEGATIVE
GLUCOSE UA: NEGATIVE
KETONES UA: NEGATIVE
Leukocytes, UA: NEGATIVE
Nitrite, UA: NEGATIVE
Protein, UA: NEGATIVE

## 2017-02-07 NOTE — Progress Notes (Signed)
   LOW-RISK PREGNANCY VISIT Patient name: Sydney [name] MRN [id number]  Date of birth: [birth year] Chief Complaint:   Routine Prenatal Visit  History of Present Illness:   Sydney [name] is a 21 y.o. 812P1001 female at 8480w3d with an Estimated Date of Delivery: 03/04/17 being seen today for ongoing management of a low-risk pregnancy.  Today she reports no complaints. Contractions: Irregular. Vag. Bleeding: None.  Movement: Present. denies leaking of fluid. Review of Systems:   Pertinent items are noted in HPI Denies abnormal vaginal discharge w/ itching/odor/irritation, headaches, visual changes, shortness of breath, chest pain, abdominal pain, severe nausea/vomiting, or problems with urination or bowel movements unless otherwise stated above. Pertinent History Reviewed:  Reviewed past medical,surgical, social, obstetrical and family history.  Reviewed problem list, medications and allergies. Physical Assessment:   Vitals:   02/07/17 1221  BP: 130/78  Pulse: 84  Weight: 220 lb (99.8 kg)  Body mass index is 38.97 kg/m.        Physical Examination:   General appearance: Well appearing, and in no distress  Mental status: Alert, oriented to person, place, and time  Skin: Warm & dry  Cardiovascular: Normal heart rate noted  Respiratory: Normal respiratory effort, no distress  Abdomen: Soft, gravid, nontender  Pelvic: Cervical exam deferred         Extremities: Edema: Trace  Fetal Status: Fetal Heart Rate (bpm): 142 Fundal Height: 37 cm Movement: Present    Results for orders placed or performed in visit on 02/07/17 (from the past 24 hour(s))  POCT urinalysis dipstick   Collection Time: 02/07/17 12:24 PM  Result Value Ref Range   Color, UA     Clarity, UA     Glucose, UA neg    Bilirubin, UA     Ketones, UA neg    Spec Grav, UA  1.010 - 1.025   Blood, UA neg    pH, UA  5.0 - 8.0   Protein, UA neg    Urobilinogen, UA  0.2 or 1.0 E.U./dL   Nitrite, UA neg    Leukocytes, UA  Negative Negative   Appearance     Odor      Assessment & Plan:  1) Low-risk pregnancy G2P1001 at 5480w3d with an Estimated Date of Delivery: 03/04/17    Labs/procedures today: none  Plan:  Continue routine obstetrical care   Reviewed: Preterm labor symptoms and general obstetric precautions including but not limited to vaginal bleeding, contractions, leaking of fluid and fetal movement were reviewed in detail with the patient.  All questions were answered  Follow-up: Return in about 1 week (around 02/14/2017) for LROB. and gbs  Orders Placed This Encounter  Procedures  . POCT urinalysis dipstick   Marge DuncansBooker, Deshay Blumenfeld Randall CNM, [facility]BC 02/07/2017 12:53 PM

## 2017-02-07 NOTE — Patient Instructions (Signed)
Sydney [name], I greatly value your feedback.  If you receive a survey following your visit with us today, we appreciate you taking the time to fill it out.  Thanks, Sydney [name], CNM, WHNP-BC   Call the office ([phone]) or go to [facility] if:  You begin to have strong, frequent contractions  Your water breaks.  Sometimes it is a big gush of fluid, sometimes it is just a trickle that keeps getting your panties wet or running down your legs  You have vaginal bleeding.  It is normal to have a small amount of spotting if your cervix was checked.   You don't feel your baby moving like normal.  If you don't, get you something to eat and drink and lay down and focus on feeling your baby move.  You should feel at least 10 movements in 2 hours.  If you don't, you should call the office or go to [facility].    Preterm Labor and Birth Information The normal length of a pregnancy is 39-41 weeks. Preterm labor is when labor starts before 37 completed weeks of pregnancy. What are the risk factors for preterm labor? Preterm labor is more likely to occur in women who:  Have certain infections during pregnancy such as a bladder infection, sexually transmitted infection, or infection inside the uterus (chorioamnionitis).  Have a shorter-than-normal cervix.  Have gone into preterm labor before.  Have had surgery on their cervix.  Are younger than age 17 or older than age 35.  Are African American.  Are pregnant with twins or multiple babies (multiple gestation).  Take street drugs or smoke while pregnant.  Do not gain enough weight while pregnant.  Became pregnant shortly after having been pregnant.  What are the symptoms of preterm labor? Symptoms of preterm labor include:  Cramps similar to those that can happen during a menstrual period. The cramps may happen with diarrhea.  Pain in the abdomen or lower back.  Regular uterine contractions that may feel like tightening of  the abdomen.  A feeling of increased pressure in the pelvis.  Increased watery or bloody mucus discharge from the vagina.  Water breaking (ruptured amniotic sac).  Why is it important to recognize signs of preterm labor? It is important to recognize signs of preterm labor because babies who are born prematurely may not be fully developed. This can put them at an increased risk for:  Long-term (chronic) heart and lung problems.  Difficulty immediately after birth with regulating body systems, including blood sugar, body temperature, heart rate, and breathing rate.  Bleeding in the brain.  Cerebral palsy.  Learning difficulties.  Death.  These risks are highest for babies who are born before 34 weeks of pregnancy. How is preterm labor treated? Treatment depends on the length of your pregnancy, your condition, and the health of your baby. It may involve:  Having a stitch (suture) placed in your cervix to prevent your cervix from opening too early (cerclage).  Taking or being given medicines, such as: ? Hormone medicines. These may be given early in pregnancy to help support the pregnancy. ? Medicine to stop contractions. ? Medicines to help mature the baby's lungs. These may be prescribed if the risk of delivery is high. ? Medicines to prevent your baby from developing cerebral palsy.  If the labor happens before 34 weeks of pregnancy, you may need to stay in the hospital. What should I do if I think I am in preterm labor? If you think   that you are going into preterm labor, call your health care provider right away. How can I prevent preterm labor in future pregnancies? To increase your chance of having a full-term pregnancy:  Do not use any tobacco products, such as cigarettes, chewing tobacco, and e-cigarettes. If you need help quitting, ask your health care provider.  Do not use street drugs or medicines that have not been prescribed to you during your pregnancy.  Talk  with your health care provider before taking any herbal supplements, even if you have been taking them regularly.  Make sure you gain a healthy amount of weight during your pregnancy.  Watch for infection. If you think that you might have an infection, get it checked right away.  Make sure to tell your health care provider if you have gone into preterm labor before.  This information is not intended to replace advice given to you by your health care provider. Make sure you discuss any questions you have with your health care provider. Document Released: 04/20/2003 Document Revised: 07/11/2015 Document Reviewed: 06/21/2015 Elsevier Interactive Patient Education  2018 [employer]

## 2017-02-11 NOTE — L&D Delivery Note (Signed)
Patient is 22 y.o. G2P1001 7731w4d admitted for SOL. SROM at 0830.  Prenatal course uncomplicated.  Delivery Note At 3:05 PM a viable female was delivered via Vaginal, Spontaneous (Presentation: LOA with compound hand).  APGAR: 9, 9; weight pending.   Placenta status: Intact.  Cord: 3V with the following complications: None.  Cord pH: N/A  Anesthesia: Epidural  Episiotomy: None Lacerations: 1st degree;Vaginal Suture Repair: 3.0 vicryl Est. Blood Loss (mL): 300  Mom to postpartum.  Baby to Couplet care / Skin to Skin.  Caryl AdaJazma Garo [place], DO OB Fellow Center for [facility], [facility]

## 2017-02-13 ENCOUNTER — Encounter: Payer: BLUE CROSS/BLUE SHIELD | Admitting: Obstetrics & Gynecology

## 2017-02-20 ENCOUNTER — Encounter: Payer: Self-pay | Admitting: Advanced Practice Midwife

## 2017-02-20 ENCOUNTER — Ambulatory Visit (INDEPENDENT_AMBULATORY_CARE_PROVIDER_SITE_OTHER): Payer: BLUE CROSS/BLUE SHIELD | Admitting: Advanced Practice Midwife

## 2017-02-20 ENCOUNTER — Other Ambulatory Visit: Payer: Self-pay

## 2017-02-20 VITALS — BP 118/76 | HR 103 | Wt 219.0 lb

## 2017-02-20 DIAGNOSIS — Z3A38 38 weeks gestation of pregnancy: Secondary | ICD-10-CM

## 2017-02-20 DIAGNOSIS — Z331 Pregnant state, incidental: Secondary | ICD-10-CM

## 2017-02-20 DIAGNOSIS — Z3483 Encounter for supervision of other normal pregnancy, third trimester: Secondary | ICD-10-CM

## 2017-02-20 DIAGNOSIS — Z1389 Encounter for screening for other disorder: Secondary | ICD-10-CM

## 2017-02-20 LAB — POCT URINALYSIS DIPSTICK
Blood, UA: NEGATIVE
Glucose, UA: NEGATIVE
Ketones, UA: NEGATIVE
Leukocytes, UA: NEGATIVE
Nitrite, UA: NEGATIVE
Protein, UA: NEGATIVE

## 2017-02-20 LAB — OB RESULTS CONSOLE GBS: STREP GROUP B AG: NEGATIVE

## 2017-02-20 NOTE — Progress Notes (Signed)
G2P1001 6643w2d Estimated Date of Delivery: 03/04/17  Blood pressure 118/76, pulse (!) 103, weight 219 lb (99.3 kg), last menstrual period 05/28/2016.   BP weight and urine results all reviewed and noted.  Please refer to the obstetrical flow sheet for the fundal height and fetal heart rate documentation:  Patient reports good fetal movement, denies any bleeding and no rupture of membranes symptoms or regular contractions. Patient is without complaints. All questions were answered.  Orders Placed This Encounter  Procedures  . GC/Chlamydia Probe Amp  . Culture, beta strep (group b only)  . POCT urinalysis dipstick    Plan:  Continued routine obstetrical care,   Return in about 1 week (around 02/27/2017) for LROB.   \

## 2017-02-22 LAB — GC/CHLAMYDIA PROBE AMP
CHLAMYDIA, DNA PROBE: NEGATIVE
NEISSERIA GONORRHOEAE BY PCR: NEGATIVE

## 2017-02-24 LAB — CULTURE, BETA STREP (GROUP B ONLY): Strep Gp B Culture: NEGATIVE

## 2017-03-03 ENCOUNTER — Encounter: Payer: Self-pay | Admitting: Obstetrics & Gynecology

## 2017-03-03 ENCOUNTER — Ambulatory Visit (INDEPENDENT_AMBULATORY_CARE_PROVIDER_SITE_OTHER): Payer: BLUE CROSS/BLUE SHIELD | Admitting: Obstetrics & Gynecology

## 2017-03-03 VITALS — BP 118/76 | HR 96 | Wt 218.0 lb

## 2017-03-03 DIAGNOSIS — Z331 Pregnant state, incidental: Secondary | ICD-10-CM

## 2017-03-03 DIAGNOSIS — Z3483 Encounter for supervision of other normal pregnancy, third trimester: Secondary | ICD-10-CM | POA: Diagnosis not present

## 2017-03-03 DIAGNOSIS — Z1389 Encounter for screening for other disorder: Secondary | ICD-10-CM

## 2017-03-03 DIAGNOSIS — Z3A39 39 weeks gestation of pregnancy: Secondary | ICD-10-CM | POA: Diagnosis not present

## 2017-03-03 LAB — POCT URINALYSIS DIPSTICK
Blood, UA: NEGATIVE
GLUCOSE UA: NEGATIVE
KETONES UA: NEGATIVE
Leukocytes, UA: NEGATIVE
Nitrite, UA: NEGATIVE
Protein, UA: NEGATIVE

## 2017-03-03 NOTE — Treatment Plan (Signed)
   Induction Assessment Scheduling Form: Fax to [facility]:  [phone]  Sydney [name]                                                                                   DOB:  1995/12/01                                                            MRN:  [id number]                                                                     Phone #:       [phone]                     Provider:  [facility]  GP:  [id number]                                                            Estimated Date of Delivery: 03/04/17  Dating Criteria: LMP 9 week sonogram    Medical Indications for induction:  pitocin Admission Date/Time:  03/11/2017@0730  Gestational age on admission:  2332w0d   Filed Weights   03/03/17 1526  Weight: 218 lb (98.9 kg)   HIV:  Non Reactive (10/19 0908) GEX:BMWUXLKGGBS:negative    Cervical exam pending   Method of induction(proposed):  Pitocin(plan to have foley placed in the office)   Scheduling Provider Signature:  Lazaro ArmsLuther H Eure, MD                                            Today's Date:  03/03/2017

## 2017-03-03 NOTE — Progress Notes (Signed)
G2P1001 202w6d Estimated Date of Delivery: 03/04/17  Blood pressure 118/76, pulse 96, weight 218 lb (98.9 kg), last menstrual period 05/28/2016.   BP weight and urine results all reviewed and noted.  Please refer to the obstetrical flow sheet for the fundal height and fetal heart rate documentation:  Patient reports good fetal movement, denies any bleeding and no rupture of membranes symptoms or regular contractions. Patient is without complaints. All questions were answered.  Orders Placed This Encounter  Procedures  . POCT urinalysis dipstick    Plan:  Continued routine obstetrical care, cx 2/th/-3/soft/posterior Induction 03/11/2017@0730  Will put foley bulb in next week in the office if reactive NST  Return in about 7 days (around 03/10/2017) for NST, place foley bulb in cervix, LROB, with Dr Despina HiddenEure.

## 2017-03-04 ENCOUNTER — Telehealth (HOSPITAL_COMMUNITY): Payer: Self-pay | Admitting: *Deleted

## 2017-03-04 NOTE — Telephone Encounter (Signed)
Preadmission screen  

## 2017-03-05 ENCOUNTER — Inpatient Hospital Stay (HOSPITAL_COMMUNITY)
Admission: AD | Admit: 2017-03-05 | Discharge: 2017-03-06 | Disposition: A | Discharge status: Home / Self Care | Payer: BLUE CROSS/BLUE SHIELD | Source: Ambulatory Visit | Attending: Obstetrics & Gynecology | Admitting: Obstetrics & Gynecology

## 2017-03-05 ENCOUNTER — Other Ambulatory Visit: Payer: Self-pay | Admitting: Advanced Practice Midwife

## 2017-03-05 ENCOUNTER — Encounter (HOSPITAL_COMMUNITY): Payer: Self-pay | Admitting: *Deleted

## 2017-03-05 DIAGNOSIS — Z3A40 40 weeks gestation of pregnancy: Secondary | ICD-10-CM | POA: Insufficient documentation

## 2017-03-05 DIAGNOSIS — O471 False labor at or after 37 completed weeks of gestation: Secondary | ICD-10-CM | POA: Insufficient documentation

## 2017-03-05 DIAGNOSIS — O479 False labor, unspecified: Secondary | ICD-10-CM

## 2017-03-05 NOTE — MAU Note (Signed)
Pt presents to MAU c/o ctxs that started at 1854. Pt denies bleeding or LOF. Pt reports good FM. Pt was dilated to a 2 on Monday. No further complaints.

## 2017-03-06 DIAGNOSIS — O471 False labor at or after 37 completed weeks of gestation: Secondary | ICD-10-CM | POA: Diagnosis present

## 2017-03-06 DIAGNOSIS — Z3A40 40 weeks gestation of pregnancy: Secondary | ICD-10-CM | POA: Diagnosis not present

## 2017-03-06 NOTE — Discharge Instructions (Signed)
Braxton Hicks Contractions °Contractions of the uterus can occur throughout pregnancy, but they are not always a sign that you are in labor. You may have practice contractions called Braxton Hicks contractions. These false labor contractions are sometimes confused with true labor. °What are Braxton Hicks contractions? °Braxton Hicks contractions are tightening movements that occur in the muscles of the uterus before labor. Unlike true labor contractions, these contractions do not result in opening (dilation) and thinning of the cervix. Toward the end of pregnancy (32-34 weeks), Braxton Hicks contractions can happen more often and may become stronger. These contractions are sometimes difficult to tell apart from true labor because they can be very uncomfortable. You should not feel embarrassed if you go to the hospital with false labor. °Sometimes, the only way to tell if you are in true labor is for your health care provider to look for changes in the cervix. The health care provider will do a physical exam and may monitor your contractions. If you are not in true labor, the exam should show that your cervix is not dilating and your water has not broken. °If there are other health problems associated with your pregnancy, it is completely safe for you to be sent home with false labor. You may continue to have Braxton Hicks contractions until you go into true labor. °How to tell the difference between true labor and false labor °True labor °· Contractions last 30-70 seconds. °· Contractions become very regular. °· Discomfort is usually felt in the top of the uterus, and it spreads to the lower abdomen and low back. °· Contractions do not go away with walking. °· Contractions usually become more intense and increase in frequency. °· The cervix dilates and gets thinner. °False labor °· Contractions are usually shorter and not as strong as true labor contractions. °· Contractions are usually irregular. °· Contractions  are often felt in the front of the lower abdomen and in the groin. °· Contractions may go away when you walk around or change positions while lying down. °· Contractions get weaker and are shorter-lasting as time goes on. °· The cervix usually does not dilate or become thin. °Follow these instructions at home: °· Take over-the-counter and prescription medicines only as told by your health care provider. °· Keep up with your usual exercises and follow other instructions from your health care provider. °· Eat and drink lightly if you think you are going into labor. °· If Braxton Hicks contractions are making you uncomfortable: °? Change your position from lying down or resting to walking, or change from walking to resting. °? Sit and rest in a tub of warm water. °? Drink enough fluid to keep your urine pale yellow. Dehydration may cause these contractions. °? Do slow and deep breathing several times an hour. °· Keep all follow-up prenatal visits as told by your health care provider. This is important. °Contact a health care provider if: °· You have a fever. °· You have continuous pain in your abdomen. °Get help right away if: °· Your contractions become stronger, more regular, and closer together. °· You have fluid leaking or gushing from your vagina. °· You pass blood-tinged mucus (bloody show). °· You have bleeding from your vagina. °· You have low back pain that you never had before. °· You feel your baby’s head pushing down and causing pelvic pressure. °· Your baby is not moving inside you as much as it used to. °Summary °· Contractions that occur before labor are called Braxton   Hicks contractions, false labor, or practice contractions. °· Braxton Hicks contractions are usually shorter, weaker, farther apart, and less regular than true labor contractions. True labor contractions usually become progressively stronger and regular and they become more frequent. °· Manage discomfort from Braxton Hicks contractions by  changing position, resting in a warm bath, drinking plenty of water, or practicing deep breathing. °This information is not intended to replace advice given to you by your health care provider. Make sure you discuss any questions you have with your health care provider. °Document Released: 06/13/2016 Document Revised: 06/13/2016 Document Reviewed: 06/13/2016 °Elsevier Interactive Patient Education © 2018 Elsevier Inc. ° °

## 2017-03-07 ENCOUNTER — Inpatient Hospital Stay (EMERGENCY_DEPARTMENT_HOSPITAL)
Admission: AD | Admit: 2017-03-07 | Discharge: 2017-03-07 | Disposition: A | Discharge status: Home / Self Care | Payer: BLUE CROSS/BLUE SHIELD | Source: Ambulatory Visit | Attending: Obstetrics and Gynecology | Admitting: Obstetrics and Gynecology

## 2017-03-07 ENCOUNTER — Encounter (HOSPITAL_COMMUNITY): Payer: Self-pay | Admitting: Emergency Medicine

## 2017-03-07 DIAGNOSIS — Z3483 Encounter for supervision of other normal pregnancy, third trimester: Secondary | ICD-10-CM | POA: Diagnosis not present

## 2017-03-07 DIAGNOSIS — O471 False labor at or after 37 completed weeks of gestation: Secondary | ICD-10-CM

## 2017-03-07 NOTE — Discharge Instructions (Signed)
Braxton Hicks Contractions °Contractions of the uterus can occur throughout pregnancy, but they are not always a sign that you are in labor. You may have practice contractions called Braxton Hicks contractions. These false labor contractions are sometimes confused with true labor. °What are Braxton Hicks contractions? °Braxton Hicks contractions are tightening movements that occur in the muscles of the uterus before labor. Unlike true labor contractions, these contractions do not result in opening (dilation) and thinning of the cervix. Toward the end of pregnancy (32-34 weeks), Braxton Hicks contractions can happen more often and may become stronger. These contractions are sometimes difficult to tell apart from true labor because they can be very uncomfortable. You should not feel embarrassed if you go to the hospital with false labor. °Sometimes, the only way to tell if you are in true labor is for your health care provider to look for changes in the cervix. The health care provider will do a physical exam and may monitor your contractions. If you are not in true labor, the exam should show that your cervix is not dilating and your water has not broken. °If there are other health problems associated with your pregnancy, it is completely safe for you to be sent home with false labor. You may continue to have Braxton Hicks contractions until you go into true labor. °How to tell the difference between true labor and false labor °True labor °· Contractions last 30-70 seconds. °· Contractions become very regular. °· Discomfort is usually felt in the top of the uterus, and it spreads to the lower abdomen and low back. °· Contractions do not go away with walking. °· Contractions usually become more intense and increase in frequency. °· The cervix dilates and gets thinner. °False labor °· Contractions are usually shorter and not as strong as true labor contractions. °· Contractions are usually irregular. °· Contractions  are often felt in the front of the lower abdomen and in the groin. °· Contractions may go away when you walk around or change positions while lying down. °· Contractions get weaker and are shorter-lasting as time goes on. °· The cervix usually does not dilate or become thin. °Follow these instructions at home: °· Take over-the-counter and prescription medicines only as told by your health care provider. °· Keep up with your usual exercises and follow other instructions from your health care provider. °· Eat and drink lightly if you think you are going into labor. °· If Braxton Hicks contractions are making you uncomfortable: °? Change your position from lying down or resting to walking, or change from walking to resting. °? Sit and rest in a tub of warm water. °? Drink enough fluid to keep your urine pale yellow. Dehydration may cause these contractions. °? Do slow and deep breathing several times an hour. °· Keep all follow-up prenatal visits as told by your health care provider. This is important. °Contact a health care provider if: °· You have a fever. °· You have continuous pain in your abdomen. °Get help right away if: °· Your contractions become stronger, more regular, and closer together. °· You have fluid leaking or gushing from your vagina. °· You pass blood-tinged mucus (bloody show). °· You have bleeding from your vagina. °· You have low back pain that you never had before. °· You feel your baby’s head pushing down and causing pelvic pressure. °· Your baby is not moving inside you as much as it used to. °Summary °· Contractions that occur before labor are called Braxton   Hicks contractions, false labor, or practice contractions. °· Braxton Hicks contractions are usually shorter, weaker, farther apart, and less regular than true labor contractions. True labor contractions usually become progressively stronger and regular and they become more frequent. °· Manage discomfort from Braxton Hicks contractions by  changing position, resting in a warm bath, drinking plenty of water, or practicing deep breathing. °This information is not intended to replace advice given to you by your health care provider. Make sure you discuss any questions you have with your health care provider. °Document Released: 06/13/2016 Document Revised: 06/13/2016 Document Reviewed: 06/13/2016 °Elsevier Interactive Patient Education © 2018 Elsevier Inc. ° °

## 2017-03-07 NOTE — MAU Note (Signed)
Pt presents to MAU w/co ctxs every 6 mins pts got stronger at 2pm. Denies LOF. + FM. Scant amt of bleeding.

## 2017-03-07 NOTE — MAU Note (Signed)
Urine sent to lab 

## 2017-03-07 NOTE — MAU Note (Signed)
I have communicated with Dr.Leland and reviewed vital signs:  Vitals:   03/07/17 1655 03/07/17 1657  BP: 122/74   Pulse: (!) 127   Resp: 17   Temp: 98.1 F (36.7 C)   SpO2:  97%    Vaginal exam:  Dilation: 2 Effacement (%): 50 Cervical Position: Middle Station: -3 Presentation: Vertex Exam by:: Una Yeomans, rn,   Also reviewed contraction pattern and that non-stress test is reactive.  It has been documented that patient is contracting every 5-6 minutes with no cervical change over 1.5 hours not indicating active labor.  Patient denies any other complaints.  Based on this report provider has given order for discharge.  A discharge order and diagnosis entered by a provider.   Labor discharge instructions reviewed with patient.

## 2017-03-08 ENCOUNTER — Inpatient Hospital Stay (HOSPITAL_COMMUNITY): Payer: BLUE CROSS/BLUE SHIELD | Admitting: Anesthesiology

## 2017-03-08 ENCOUNTER — Encounter (HOSPITAL_COMMUNITY): Payer: Self-pay | Admitting: *Deleted

## 2017-03-08 ENCOUNTER — Encounter (HOSPITAL_COMMUNITY)
Admission: AD | Disposition: A | Discharge status: Home / Self Care | Payer: Self-pay | Source: Ambulatory Visit | Attending: Obstetrics & Gynecology

## 2017-03-08 ENCOUNTER — Inpatient Hospital Stay (HOSPITAL_COMMUNITY)
Admission: AD | Admit: 2017-03-08 | Discharge: 2017-03-10 | DRG: 798 | Disposition: A | Discharge status: Home / Self Care | Payer: BLUE CROSS/BLUE SHIELD | Source: Ambulatory Visit | Attending: Obstetrics & Gynecology | Admitting: Obstetrics & Gynecology

## 2017-03-08 DIAGNOSIS — Z91040 Latex allergy status: Secondary | ICD-10-CM

## 2017-03-08 DIAGNOSIS — Z87891 Personal history of nicotine dependence: Secondary | ICD-10-CM | POA: Diagnosis not present

## 2017-03-08 DIAGNOSIS — Z302 Encounter for sterilization: Secondary | ICD-10-CM

## 2017-03-08 DIAGNOSIS — Z3A40 40 weeks gestation of pregnancy: Secondary | ICD-10-CM

## 2017-03-08 DIAGNOSIS — Z3483 Encounter for supervision of other normal pregnancy, third trimester: Secondary | ICD-10-CM | POA: Diagnosis present

## 2017-03-08 DIAGNOSIS — Z3490 Encounter for supervision of normal pregnancy, unspecified, unspecified trimester: Secondary | ICD-10-CM | POA: Diagnosis present

## 2017-03-08 DIAGNOSIS — Z3202 Encounter for pregnancy test, result negative: Secondary | ICD-10-CM | POA: Diagnosis not present

## 2017-03-08 HISTORY — PX: TUBAL LIGATION: SHX77

## 2017-03-08 LAB — CBC
HEMATOCRIT: 37.5 % (ref 36.0–46.0)
HEMOGLOBIN: 12.4 g/dL (ref 12.0–15.0)
MCH: 24.7 pg — ABNORMAL LOW (ref 26.0–34.0)
MCHC: 33.1 g/dL (ref 30.0–36.0)
MCV: 74.6 fL — ABNORMAL LOW (ref 78.0–100.0)
Platelets: 234 10*3/uL (ref 150–400)
RBC: 5.03 MIL/uL (ref 3.87–5.11)
RDW: 15 % (ref 11.5–15.5)
WBC: 10.5 10*3/uL (ref 4.0–10.5)

## 2017-03-08 LAB — TYPE AND SCREEN
ABO/RH(D): O POS
ANTIBODY SCREEN: NEGATIVE

## 2017-03-08 LAB — POCT FERN TEST: POCT Fern Test: POSITIVE

## 2017-03-08 SURGERY — LIGATION, FALLOPIAN TUBE, POSTPARTUM
Anesthesia: Epidural | Site: Abdomen | Laterality: Bilateral | Wound class: Clean

## 2017-03-08 MED ORDER — TETANUS-DIPHTH-ACELL PERTUSSIS 5-2.5-18.5 LF-MCG/0.5 IM SUSP: 0.5000 mL | Freq: Once | INTRAMUSCULAR | Status: DC

## 2017-03-08 MED ORDER — PHENYLEPHRINE 40 MCG/ML (10ML) SYRINGE FOR IV PUSH (FOR BLOOD PRESSURE SUPPORT): 80.0000 ug | PREFILLED_SYRINGE | INTRAVENOUS | Status: DC | PRN

## 2017-03-08 MED ORDER — ONDANSETRON HCL 4 MG/2ML IJ SOLN: 4.0000 mg | Freq: Once | INTRAMUSCULAR | Status: DC | PRN

## 2017-03-08 MED ORDER — LACTATED RINGERS IV SOLN: 500.0000 mL | Freq: Once | INTRAVENOUS | Status: DC

## 2017-03-08 MED ORDER — ACETAMINOPHEN 160 MG/5ML PO SOLN: 325.0000 mg | ORAL | Status: DC | PRN

## 2017-03-08 MED ORDER — ONDANSETRON HCL 4 MG/2ML IJ SOLN: 4.0000 mg | Freq: Three times a day (TID) | INTRAMUSCULAR | Status: DC | PRN

## 2017-03-08 MED ORDER — SCOPOLAMINE 1 MG/3DAYS TD PT72: 1.0000 | MEDICATED_PATCH | Freq: Once | TRANSDERMAL | Status: DC

## 2017-03-08 MED ORDER — TERBUTALINE SULFATE 1 MG/ML IJ SOLN: 0.2500 mg | Freq: Once | INTRAMUSCULAR | Status: DC | PRN

## 2017-03-08 MED ORDER — ZOLPIDEM TARTRATE 5 MG PO TABS: 5.0000 mg | ORAL_TABLET | Freq: Every evening | ORAL | Status: DC | PRN

## 2017-03-08 MED ORDER — PHENYLEPHRINE 40 MCG/ML (10ML) SYRINGE FOR IV PUSH (FOR BLOOD PRESSURE SUPPORT)
PREFILLED_SYRINGE | INTRAVENOUS | Status: AC
  Filled 2017-03-08: qty 10

## 2017-03-08 MED ORDER — SOD CITRATE-CITRIC ACID 500-334 MG/5ML PO SOLN: 30.0000 mL | ORAL | Status: DC | PRN

## 2017-03-08 MED ORDER — ONDANSETRON HCL 4 MG/2ML IJ SOLN: 4.0000 mg | Freq: Four times a day (QID) | INTRAMUSCULAR | Status: DC | PRN

## 2017-03-08 MED ORDER — SODIUM BICARBONATE 8.4 % IV SOLN
INTRAVENOUS | Status: DC | PRN
  Administered 2017-03-08: 10 mL via EPIDURAL
  Administered 2017-03-08 (×2): 5 mL via EPIDURAL

## 2017-03-08 MED ORDER — ONDANSETRON HCL 4 MG PO TABS: 4.0000 mg | ORAL_TABLET | ORAL | Status: DC | PRN

## 2017-03-08 MED ORDER — SIMETHICONE 80 MG PO CHEW: 80.0000 mg | CHEWABLE_TABLET | ORAL | Status: DC | PRN

## 2017-03-08 MED ORDER — WITCH HAZEL-GLYCERIN EX PADS: 1.0000 "application " | MEDICATED_PAD | CUTANEOUS | Status: DC | PRN

## 2017-03-08 MED ORDER — OXYCODONE HCL 5 MG/5ML PO SOLN: 5.0000 mg | Freq: Once | ORAL | Status: DC | PRN

## 2017-03-08 MED ORDER — OXYCODONE-ACETAMINOPHEN 5-325 MG PO TABS: 2.0000 | ORAL_TABLET | ORAL | Status: DC | PRN

## 2017-03-08 MED ORDER — KETOROLAC TROMETHAMINE 30 MG/ML IJ SOLN: 30.0000 mg | Freq: Once | INTRAMUSCULAR | Status: DC | PRN

## 2017-03-08 MED ORDER — DIBUCAINE 1 % RE OINT: 1.0000 "application " | TOPICAL_OINTMENT | RECTAL | Status: DC | PRN

## 2017-03-08 MED ORDER — DIPHENHYDRAMINE HCL 25 MG PO CAPS: 25.0000 mg | ORAL_CAPSULE | Freq: Four times a day (QID) | ORAL | Status: DC | PRN

## 2017-03-08 MED ORDER — FENTANYL 2.5 MCG/ML BUPIVACAINE 1/10 % EPIDURAL INFUSION (WH - ANES)
INTRAMUSCULAR | Status: AC
  Filled 2017-03-08: qty 100

## 2017-03-08 MED ORDER — LACTATED RINGERS IV SOLN: 500.0000 mL | INTRAVENOUS | Status: DC | PRN

## 2017-03-08 MED ORDER — OXYTOCIN 40 UNITS IN LACTATED RINGERS INFUSION - SIMPLE MED: 2.5000 [IU]/h | INTRAVENOUS | Status: DC

## 2017-03-08 MED ORDER — FENTANYL CITRATE (PF) 100 MCG/2ML IJ SOLN
INTRAMUSCULAR | Status: DC | PRN
  Administered 2017-03-08: 100 ug via INTRAVENOUS
  Administered 2017-03-08: 100 ug via EPIDURAL

## 2017-03-08 MED ORDER — ACETAMINOPHEN 325 MG PO TABS: 650.0000 mg | ORAL_TABLET | ORAL | Status: DC | PRN

## 2017-03-08 MED ORDER — DIPHENHYDRAMINE HCL 50 MG/ML IJ SOLN: 12.5000 mg | INTRAMUSCULAR | Status: DC | PRN

## 2017-03-08 MED ORDER — SENNOSIDES-DOCUSATE SODIUM 8.6-50 MG PO TABS
2.0000 | ORAL_TABLET | ORAL | Status: DC
  Administered 2017-03-09 (×2): 2 via ORAL
  Filled 2017-03-08 (×2): qty 2

## 2017-03-08 MED ORDER — NALBUPHINE HCL 10 MG/ML IJ SOLN: 5.0000 mg | Freq: Once | INTRAMUSCULAR | Status: DC | PRN

## 2017-03-08 MED ORDER — FENTANYL CITRATE (PF) 100 MCG/2ML IJ SOLN
INTRAMUSCULAR | Status: AC
  Filled 2017-03-08: qty 2

## 2017-03-08 MED ORDER — SOD CITRATE-CITRIC ACID 500-334 MG/5ML PO SOLN
30.0000 mL | ORAL | Status: DC | PRN
Start: 2017-03-08 — End: 2017-03-08
  Administered 2017-03-08: 30 mL via ORAL

## 2017-03-08 MED ORDER — PRENATAL MULTIVITAMIN CH
1.0000 | ORAL_TABLET | Freq: Every day | ORAL | Status: DC
  Administered 2017-03-09: 1 via ORAL
  Filled 2017-03-08: qty 1

## 2017-03-08 MED ORDER — MEPERIDINE HCL 25 MG/ML IJ SOLN
INTRAMUSCULAR | Status: AC
  Administered 2017-03-08: 12.5 mg via INTRAVENOUS
  Filled 2017-03-08: qty 1

## 2017-03-08 MED ORDER — EPHEDRINE 5 MG/ML INJ: 10.0000 mg | INTRAVENOUS | Status: DC | PRN

## 2017-03-08 MED ORDER — NALBUPHINE HCL 10 MG/ML IJ SOLN: 5.0000 mg | INTRAMUSCULAR | Status: DC | PRN

## 2017-03-08 MED ORDER — LIDOCAINE-EPINEPHRINE (PF) 2 %-1:200000 IJ SOLN
INTRAMUSCULAR | Status: AC
  Filled 2017-03-08: qty 20

## 2017-03-08 MED ORDER — NALOXONE HCL 0.4 MG/ML IJ SOLN: 0.4000 mg | INTRAMUSCULAR | Status: DC | PRN

## 2017-03-08 MED ORDER — BENZOCAINE-MENTHOL 20-0.5 % EX AERO
1.0000 "application " | INHALATION_SPRAY | CUTANEOUS | Status: DC | PRN
  Filled 2017-03-08 (×2): qty 56

## 2017-03-08 MED ORDER — LIDOCAINE HCL (PF) 1 % IJ SOLN
30.0000 mL | INTRAMUSCULAR | Status: DC | PRN
  Filled 2017-03-08: qty 30

## 2017-03-08 MED ORDER — FENTANYL CITRATE (PF) 100 MCG/2ML IJ SOLN: 25.0000 ug | INTRAMUSCULAR | Status: DC | PRN

## 2017-03-08 MED ORDER — KETOROLAC TROMETHAMINE 30 MG/ML IJ SOLN
INTRAMUSCULAR | Status: AC
  Administered 2017-03-08: 30 mg via INTRAMUSCULAR
  Filled 2017-03-08: qty 1

## 2017-03-08 MED ORDER — OXYCODONE-ACETAMINOPHEN 5-325 MG PO TABS: 1.0000 | ORAL_TABLET | ORAL | Status: DC | PRN

## 2017-03-08 MED ORDER — DIPHENHYDRAMINE HCL 25 MG PO CAPS
25.0000 mg | ORAL_CAPSULE | ORAL | Status: DC | PRN
  Filled 2017-03-08: qty 1

## 2017-03-08 MED ORDER — MIDAZOLAM HCL 5 MG/5ML IJ SOLN
INTRAMUSCULAR | Status: DC | PRN
  Administered 2017-03-08 (×2): 1 mg via INTRAVENOUS

## 2017-03-08 MED ORDER — ACETAMINOPHEN 325 MG PO TABS: 325.0000 mg | ORAL_TABLET | ORAL | Status: DC | PRN

## 2017-03-08 MED ORDER — IBUPROFEN 600 MG PO TABS
600.0000 mg | ORAL_TABLET | Freq: Four times a day (QID) | ORAL | Status: DC
  Administered 2017-03-09 – 2017-03-10 (×3): 600 mg via ORAL
  Filled 2017-03-08 (×3): qty 1

## 2017-03-08 MED ORDER — SODIUM CHLORIDE 0.9% FLUSH: 3.0000 mL | INTRAVENOUS | Status: DC | PRN

## 2017-03-08 MED ORDER — KETOROLAC TROMETHAMINE 30 MG/ML IJ SOLN
30.0000 mg | Freq: Four times a day (QID) | INTRAMUSCULAR | Status: AC | PRN
  Administered 2017-03-08: 30 mg via INTRAMUSCULAR
  Administered 2017-03-09 (×3): 30 mg via INTRAVENOUS
  Filled 2017-03-08 (×3): qty 1

## 2017-03-08 MED ORDER — LIDOCAINE HCL (PF) 1 % IJ SOLN
INTRAMUSCULAR | Status: DC | PRN
  Administered 2017-03-08 (×2): 5 mL via EPIDURAL

## 2017-03-08 MED ORDER — KETOROLAC TROMETHAMINE 30 MG/ML IJ SOLN: 30.0000 mg | Freq: Four times a day (QID) | INTRAMUSCULAR | Status: AC | PRN

## 2017-03-08 MED ORDER — ONDANSETRON HCL 4 MG/2ML IJ SOLN
INTRAMUSCULAR | Status: DC | PRN
  Administered 2017-03-08: 4 mg via INTRAVENOUS

## 2017-03-08 MED ORDER — FENTANYL 2.5 MCG/ML BUPIVACAINE 1/10 % EPIDURAL INFUSION (WH - ANES): 14.0000 mL/h | INTRAMUSCULAR | Status: DC | PRN

## 2017-03-08 MED ORDER — OXYCODONE HCL 5 MG PO TABS: 5.0000 mg | ORAL_TABLET | Freq: Once | ORAL | Status: DC | PRN

## 2017-03-08 MED ORDER — OXYTOCIN BOLUS FROM INFUSION
500.0000 mL | Freq: Once | INTRAVENOUS | Status: AC
  Administered 2017-03-08: 500 mL via INTRAVENOUS

## 2017-03-08 MED ORDER — MEPERIDINE HCL 25 MG/ML IJ SOLN
INTRAMUSCULAR | Status: AC
  Filled 2017-03-08: qty 1

## 2017-03-08 MED ORDER — SOD CITRATE-CITRIC ACID 500-334 MG/5ML PO SOLN
ORAL | Status: AC
  Filled 2017-03-08: qty 15

## 2017-03-08 MED ORDER — LACTATED RINGERS IV SOLN
INTRAVENOUS | Status: DC
  Administered 2017-03-08: 17:00:00 via INTRAVENOUS

## 2017-03-08 MED ORDER — BUPIVACAINE HCL (PF) 0.5 % IJ SOLN
INTRAMUSCULAR | Status: DC | PRN
Start: 2017-03-08 — End: 2017-03-08
  Administered 2017-03-08: 30 mL

## 2017-03-08 MED ORDER — FLEET ENEMA 7-19 GM/118ML RE ENEM: 1.0000 | ENEMA | RECTAL | Status: DC | PRN

## 2017-03-08 MED ORDER — ONDANSETRON HCL 4 MG/2ML IJ SOLN
INTRAMUSCULAR | Status: AC
  Filled 2017-03-08: qty 2

## 2017-03-08 MED ORDER — ACETAMINOPHEN 325 MG PO TABS
650.0000 mg | ORAL_TABLET | ORAL | Status: DC | PRN
  Administered 2017-03-09 (×3): 650 mg via ORAL
  Filled 2017-03-08 (×3): qty 2

## 2017-03-08 MED ORDER — MEPERIDINE HCL 25 MG/ML IJ SOLN: 6.2500 mg | INTRAMUSCULAR | Status: DC | PRN

## 2017-03-08 MED ORDER — ONDANSETRON HCL 4 MG/2ML IJ SOLN: 4.0000 mg | INTRAMUSCULAR | Status: DC | PRN

## 2017-03-08 MED ORDER — MEPERIDINE HCL 25 MG/ML IJ SOLN
6.2500 mg | INTRAMUSCULAR | Status: DC | PRN
  Administered 2017-03-08: 6.25 mg via INTRAVENOUS
  Administered 2017-03-08: 12.5 mg via INTRAVENOUS

## 2017-03-08 MED ORDER — LIDOCAINE HCL (PF) 1 % IJ SOLN: 30.0000 mL | INTRAMUSCULAR | Status: DC | PRN

## 2017-03-08 MED ORDER — MIDAZOLAM HCL 2 MG/2ML IJ SOLN
INTRAMUSCULAR | Status: AC
  Filled 2017-03-08: qty 2

## 2017-03-08 MED ORDER — OXYTOCIN BOLUS FROM INFUSION: 500.0000 mL | Freq: Once | INTRAVENOUS | Status: DC

## 2017-03-08 MED ORDER — FENTANYL 2.5 MCG/ML BUPIVACAINE 1/10 % EPIDURAL INFUSION (WH - ANES)
14.0000 mL/h | INTRAMUSCULAR | Status: DC | PRN
  Administered 2017-03-08: 14 mL/h via EPIDURAL

## 2017-03-08 MED ORDER — BUPIVACAINE HCL (PF) 0.5 % IJ SOLN
INTRAMUSCULAR | Status: AC
  Filled 2017-03-08: qty 30

## 2017-03-08 MED ORDER — COCONUT OIL OIL: 1.0000 "application " | TOPICAL_OIL | Status: DC | PRN

## 2017-03-08 MED ORDER — LACTATED RINGERS IV SOLN
INTRAVENOUS | Status: DC
  Administered 2017-03-08 (×2): via INTRAVENOUS
  Administered 2017-03-08: 125 mL/h via INTRAVENOUS

## 2017-03-08 MED ORDER — NALOXONE HCL 4 MG/10ML IJ SOLN
1.0000 ug/kg/h | INTRAVENOUS | Status: DC | PRN
  Filled 2017-03-08: qty 5

## 2017-03-08 MED ORDER — OXYTOCIN 40 UNITS IN LACTATED RINGERS INFUSION - SIMPLE MED
2.5000 [IU]/h | INTRAVENOUS | Status: DC
  Administered 2017-03-08: 40 mL via INTRAVENOUS
  Filled 2017-03-08: qty 1000

## 2017-03-08 SURGICAL SUPPLY — 26 items
BENZOIN TINCTURE PRP APPL 2/3 (GAUZE/BANDAGES/DRESSINGS) IMPLANT
BLADE SURG 11 STRL SS (BLADE) ×3 IMPLANT
CLIP FILSHIE TUBAL LIGA STRL (Clip) ×3 IMPLANT
CLOTH BEACON ORANGE TIMEOUT ST (SAFETY) ×3 IMPLANT
DRSG COVADERM PLUS 2X2 (GAUZE/BANDAGES/DRESSINGS) ×3 IMPLANT
DRSG OPSITE POSTOP 3X4 (GAUZE/BANDAGES/DRESSINGS) ×3 IMPLANT
DURAPREP 26ML APPLICATOR (WOUND CARE) ×3 IMPLANT
ELECT REM PT RETURN 9FT ADLT (ELECTROSURGICAL) ×3
ELECTRODE REM PT RTRN 9FT ADLT (ELECTROSURGICAL) ×1 IMPLANT
GLOVE BIOGEL PI IND STRL 7.0 (GLOVE) ×3 IMPLANT
GLOVE BIOGEL PI INDICATOR 7.0 (GLOVE) ×6
GLOVE ECLIPSE 7.0 STRL STRAW (GLOVE) ×3 IMPLANT
GOWN STRL REUS W/TWL LRG LVL3 (GOWN DISPOSABLE) ×6 IMPLANT
NEEDLE HYPO 22GX1.5 SAFETY (NEEDLE) ×3 IMPLANT
NS IRRIG 1000ML POUR BTL (IV SOLUTION) ×3 IMPLANT
PACK ABDOMINAL MINOR (CUSTOM PROCEDURE TRAY) ×3 IMPLANT
PENCIL BUTTON HOLSTER BLD 10FT (ELECTRODE) ×3 IMPLANT
PROTECTOR NERVE ULNAR (MISCELLANEOUS) ×3 IMPLANT
SPONGE LAP 4X18 X RAY DECT (DISPOSABLE) IMPLANT
SUT VIC AB 0 CT1 27 (SUTURE) ×2
SUT VIC AB 0 CT1 27XBRD ANBCTR (SUTURE) ×1 IMPLANT
SUT VICRYL 4-0 PS2 18IN ABS (SUTURE) ×3 IMPLANT
SYR CONTROL 10ML LL (SYRINGE) ×3 IMPLANT
TOWEL OR 17X24 6PK STRL BLUE (TOWEL DISPOSABLE) ×6 IMPLANT
TRAY FOLEY CATH SILVER 14FR (SET/KITS/TRAYS/PACK) ×3 IMPLANT
WATER STERILE IRR 1000ML POUR (IV SOLUTION) ×3 IMPLANT

## 2017-03-08 NOTE — Anesthesia Preprocedure Evaluation (Signed)

## 2017-03-08 NOTE — Anesthesia Postprocedure Evaluation (Signed)
Anesthesia Post Note  Patient: Sydney [name]  Procedure(s) Performed: POST PARTUM TUBAL LIGATION (Bilateral Abdomen)     Patient location during evaluation: Mother Baby Anesthesia Type: Epidural Level of consciousness: awake and alert Pain management: pain level controlled Vital Signs Assessment: post-procedure vital signs reviewed and stable Respiratory status: spontaneous breathing, nonlabored ventilation and respiratory function stable Cardiovascular status: stable Postop Assessment: no headache, no backache and epidural receding Anesthetic complications: no    Last Vitals:  Vitals:   03/08/17 1915 03/08/17 1924  BP: 118/65 121/67  Pulse: (!) 102 98  Resp: 18 18  Temp: (!) 36.2 C (!) 36.4 C  SpO2: 100%     Last Pain:  Vitals:   03/08/17 1845  TempSrc:   PainSc: 0-No pain   Pain Goal: Patients Stated Pain Goal: 0 (03/08/17 0959)               Shenae Bonanno

## 2017-03-08 NOTE — H&P (Signed)
Obstetric History and Physical  Rolena InfanteKayla Friday is a 22 y.o. G2P1001 with IUP at 5233w4d presenting for SOL/SROM. Patient states she has been having  regular, every 4 minutes contractions, minimal vaginal bleeding, ruptured membranes (unsure of color) around ~0830, with active fetal movement.    Prenatal Course Source of Care: FT  with onset of care at 12 weeks Dating: By LMP --->  Estimated Date of Delivery: 03/04/17 Pregnancy complications or risks: Patient Active Problem List   Diagnosis Date Noted  . Normal labor 03/08/2017  . Encounter for induction of labor 03/08/2017  . Fetal echogenic intracardiac focus on prenatal ultrasound 10/03/2016  . Low-lying placenta 10/03/2016  . Asymptomatic bacteriuria during pregnancy in first trimester 08/26/2016  . Supervision of normal pregnancy 08/21/2016  . History of acute pancreatitis 08/21/2016  . Family history of Down syndrome 08/21/2016  . Bilateral low back pain without sciatica 08/10/2015   She plans to breastfeed She desires bilateral tubal ligation for postpartum contraception.   Sono:    @[redacted]w[redacted]d , CWD, normal anatomy, cephalic presentation, posterior placenta, 996g, 57% EFW  Prenatal labs and studies: ABO, Rh: O/Positive/-- (07/11 1510) Antibody: Negative (10/19 0908) Rubella: 4.99 (07/11 1510) RPR: Non Reactive (10/19 0908)  HBsAg: Negative (07/11 1510)  HIV: Non Reactive (10/19 0908)  ONG:EXBMWUXLGBS:Negative (01/10 0000) 2 hr Glucola  normal Genetic screening normal Anatomy US abnormal with Lt EICF  Prenatal Transfer Tool  Maternal Diabetes: No Genetic Screening: Normal Maternal Ultrasounds/Referrals: Normal Fetal Ultrasounds or other Referrals:  Other:  Lt EICF Maternal Substance Abuse:  No Significant Maternal Medications:  None Significant Maternal Lab Results: None  Past Medical History:  Diagnosis Date  . Bronchitis   . Family history of anesthesia complication    grandparents  reaction unknown  . Gallstones   . Pneumonia      Past Surgical History:  Procedure Laterality Date  . CHOLECYSTECTOMY N/A 05/22/2013   Procedure: LAPAROSCOPIC CHOLECYSTECTOMY WITH INTRAOPERATIVE CHOLANGIOGRAM;  Surgeon: Ernestene MentionHaywood M Ingram, MD;  [facility];  Service: General;  Laterality: N/A;  . ERCP N/A 05/21/2013   Procedure: ENDOSCOPIC RETROGRADE CHOLANGIOPANCREATOGRAPHY (ERCP);  Surgeon: Theda BelfastPatrick D Hung, MD;  [facility];  Service: Endoscopy;  Laterality: N/A;  1006 induction    OB History  Gravida Para Term Preterm AB Living  2 1 1     1   SAB TAB Ectopic Multiple Live Births          1    # Outcome Date GA Lbr Len/2nd Weight Sex Delivery Anes PTL Lv  2 Current           1 Term 04/13/13 5867w0d 14:04 / 00:53 3.13 kg (6 lb 14.4 oz) F Vag-Spont EPI N LIV      Social History   Socioeconomic History  . Marital status: Married    Spouse name: None  . Number of children: None  . Years of education: None  . Highest education level: None  Social Needs  . Financial resource strain: None  . Food insecurity - worry: None  . Food insecurity - inability: None  . Transportation needs - medical: None  . Transportation needs - non-medical: None  Occupational History  . None  Tobacco Use  . Smoking status: Former Smoker    Packs/day: 1.00    Types: Cigarettes    Last attempt to quit: 04/11/2015    Years since quitting: 1.9  . Smokeless tobacco: Never Used  Substance and Sexual Activity  . Alcohol use: No  Alcohol/week: 0.0 oz  . Drug use: No  . Sexual activity: Yes    Birth control/protection: None  Other Topics Concern  . None  Social History Narrative  . None    Family History  Problem Relation Age of Onset  . Healthy Mother   . Healthy Father   . Healthy Brother   . Hypertension Maternal Grandmother   . Stroke Maternal Grandfather   . Alzheimer's disease Paternal Grandmother   . Diabetes Paternal Grandfather   . Healthy Daughter   . Healthy Maternal Aunt   . Healthy Maternal Uncle   . Healthy  Paternal Aunt     Medications Prior to Admission  Medication Sig Dispense Refill Last Dose  . acetaminophen (TYLENOL) 500 MG tablet Take 1,000 mg by mouth every 6 (six) hours as needed for moderate pain.    03/08/2017 at Unknown time  . Prenatal Vit-Fe Fumarate-FA (PRENATAL VITAMINS) 28-0.8 MG TABS 1 tablet daily (Patient taking differently: Take 1 tablet by mouth daily. 1 tablet daily) 30 tablet 11 Past Month at Unknown time    Allergies  Allergen Reactions  . Latex Rash    Review of Systems: Negative except for what is mentioned in HPI.  Physical Exam: BP 134/71 (BP Location: Left Arm)   Pulse 83   Temp 97.7 F (36.5 C) (Oral)   Resp 18   Ht 5\' 4"  (1.626 m)   Wt 98.9 kg (218 lb)   LMP 05/28/2016 (Exact Date)   BMI 37.42 kg/m  CONSTITUTIONAL: Well-developed, well-nourished female in no acute distress.  HENT:  Normocephalic, atraumatic, External right and left ear normal. Oropharynx is clear and moist EYES: Conjunctivae and EOM are normal. Pupils are equal, round, and reactive to light. No scleral icterus.  NECK: Normal range of motion, supple, no masses SKIN: Skin is warm and dry. No rash noted. Not diaphoretic. No erythema. No pallor. NEUROLOGIC: Alert and oriented to person, place, and time. Normal reflexes, muscle tone coordination. No cranial nerve deficit noted. PSYCHIATRIC: Normal mood and affect. Normal behavior. Normal judgment and thought content. CARDIOVASCULAR: Normal heart rate noted, regular rhythm RESPIRATORY: Effort and breath sounds normal, no problems with respiration noted ABDOMEN: Soft, nontender, nondistended, gravid. MUSCULOSKELETAL: Normal range of motion. No edema and no tenderness. 2+ distal pulses.  Cervical Exam: Dilation: 6 Effacement (%): 80 Cervical Position: Middle Station: -1 Presentation: Vertex Exam by:: m wilkins rnc  Presentation: cephalic FHT:  Baseline rate 120 bpm   Variability moderate  Accelerations present   Decelerations  none Contractions: Every 3-4 mins   Pertinent Labs/Studies:   Results for orders placed or performed during the hospital encounter of 03/08/17 (from the past 24 hour(s))  POCT fern test     Status: None   Collection Time: 03/08/17 10:32 AM  Result Value Ref Range   POCT Fern Test Positive = ruptured amniotic membanes     Assessment : Elaijah Munoz is a 22 y.o. G2P1001 at [redacted]w[redacted]d being admitted for labor. SROM ~0830.  Plan: Labor: Active labor. Expectant management.  Epidural FWB: Reassuring fetal heart tracing. GBS negative Delivery plan: Hopeful for vaginal delivery   Caryl Ada, DO OB Fellow Faculty Practice, [facility] 03/08/2017, 11:19 AM

## 2017-03-08 NOTE — Progress Notes (Signed)
Faculty Practice OB/GYN Attending Note  22 y.o. [phone] s/p SVD who desires permanent sterilization.  Other reversible forms of contraception were recommended and discussed with patient; she vehemently declines all other modalities. Her husband also declined vasectomy.  Risks of procedure discussed with patient including but not limited to: risk of regret, permanence and irreversibility of method, bleeding, infection, injury to surrounding organs and need for additional procedures.  Failure risk of 1-2 % with increased risk of ectopic gestation if pregnancy occurs was also discussed with patient.  Patient verbalized understanding of these risks and wants to proceed with sterilization.  Written informed consent obtained.  To OR when ready.   Jaynie CollinsUGONNA  Alvah Lagrow, MD, FACOG Obstetrician & Gynecologist, [facility] Center for [facility], [facility]

## 2017-03-08 NOTE — Lactation Note (Signed)
This note was copied from a baby's chart. Lactation Consultation Note  Patient Name: Sydney [name]: 03/08/2017 Reason for consult: Initial assessment;1st time breastfeeding;Primapara;Term  Visited with P2 Mom of 7 hr old term baby.  Mom trying to latch baby, and offered assistance.  Baby unwrapped and encouraged baby STS as much as possible.  Mom trying in cradle hold, so assisted her in using cross cradle.  Mom has flat nipples and compressible areola.  After 2 attempts, baby latched deeply and regular swallows identified for Mom and FOB.  Demonstrated alternate breast compression during sucking to increase milk transfer.  Encouraged STS and feeding baby often on cue.  Goal of 8-12 feedings per 24 hrs.  Lactation brochure given to Mom.  Mom aware of IP and OP lactation services available to her.   Encouraged Mom to ask for help prn.  Maternal Data Formula Feeding for Exclusion: No Has patient been taught Hand Expression?: Yes Does the patient have breastfeeding experience prior to this delivery?: No  Feeding Feeding Type: Breast Fed Length of feed: 15 min  LATCH Score Latch: Grasps breast easily, tongue down, lips flanged, rhythmical sucking.  Audible Swallowing: Spontaneous and intermittent  Type of Nipple: Flat  Comfort (Breast/Nipple): Soft / non-tender  Hold (Positioning): No assistance needed to correctly position infant at breast.  LATCH Score: 9  Interventions Interventions: Breast feeding basics reviewed;Assisted with latch;Skin to skin;Breast massage;Hand express;Breast compression;Adjust position;Support pillows;Position options  Lactation Tools Discussed/Used WIC Program: Yes   Consult Status Consult Status: Follow-up [name]: 03/09/17 Follow-up type: In-patient    [name], Keidra Withers E 03/08/2017, 10:06 PM

## 2017-03-08 NOTE — Anesthesia Preprocedure Evaluation (Signed)
Anesthesia Evaluation  Patient identified by MRN, date of birth, ID band Patient awake    Reviewed: Allergy & Precautions, H&P , NPO status , Patient's Chart, lab work & pertinent test results, reviewed documented beta blocker date and time   Airway Mallampati: II  TM Distance: >3 FB Neck ROM: full    Dental no notable dental hx.    Pulmonary neg pulmonary ROS, former smoker,    Pulmonary exam normal breath sounds clear to auscultation       Cardiovascular negative cardio ROS Normal cardiovascular exam Rhythm:regular Rate:Normal     Neuro/Psych negative neurological ROS  negative psych ROS   GI/Hepatic negative GI ROS, Neg liver ROS,   Endo/Other  negative endocrine ROS  Renal/GU negative Renal ROS  negative genitourinary   Musculoskeletal   Abdominal   Peds  Hematology negative hematology ROS (+)   Anesthesia Other Findings   Reproductive/Obstetrics (+) Pregnancy                             Anesthesia Physical  Anesthesia Plan  ASA: II  Anesthesia Plan: Epidural   Post-op Pain Management:    Induction:   PONV Risk Score and Plan: 2 and Treatment may vary due to age or medical condition  Airway Management Planned: Nasal Cannula and Natural Airway  Additional Equipment:   Intra-op Plan:   Post-operative Plan:   Informed Consent: I have reviewed the patients History and Physical, chart, labs and discussed the procedure including the risks, benefits and alternatives for the proposed anesthesia with the patient or authorized representative who has indicated his/her understanding and acceptance.     Plan Discussed with: CRNA and Anesthesiologist  Anesthesia Plan Comments:         Anesthesia Quick Evaluation

## 2017-03-08 NOTE — Anesthesia Pain Management Evaluation Note (Signed)
  CRNA Pain Management Visit Note  Patient: Sydney [name], [age] y.o., female  "Hello I am a member of the anesthesia [facility]. We have an anesthesia team available at all times to provide care throughout the hospital, including epidural management and anesthesia for C-section. I don't know your plan for the delivery whether it a natural birth, water birth, IV sedation, nitrous supplementation, doula or epidural, but we want to meet your pain goals."   1.Was your pain managed to your expectations on prior hospitalizations?   Yes   2.What is your expectation for pain management during this hospitalization?     Epidural  3.How can we help you reach that goal? Support PRN  Record the patient's initial score and the patient's pain goal.   Pain: 1  Pain Goal: 5 [facility] wants you to be able to say your pain was always managed very well.  [facility] Psych[facility] CenterWRINKLE,Armanie Martine 03/08/2017

## 2017-03-08 NOTE — Transfer of Care (Signed)
Immediate Anesthesia Transfer of Care Note  Patient: Sydney [name]  Procedure(s) Performed: POST PARTUM TUBAL LIGATION (Bilateral Abdomen)  Patient Location: PACU  Anesthesia Type:Epidural  Level of Consciousness: awake  Airway & Oxygen Therapy: Patient Spontanous Breathing  Post-op Assessment: Report given to RN and Post -op Vital signs reviewed and stable  Post vital signs: stable  Last Vitals:  Vitals:   03/08/17 1631 03/08/17 1800  BP: 122/63 105/72  Pulse: 100 (!) 106  Resp: 19 (!) 27  Temp:    SpO2:  98%    Last Pain:  Vitals:   03/08/17 1601  TempSrc: Oral  PainSc:       Patients Stated Pain Goal: 0 (03/08/17 0959)  Complications: No apparent anesthesia complications

## 2017-03-08 NOTE — Anesthesia Procedure Notes (Signed)
Epidural Patient location during procedure: OB Start time: 03/08/2017 11:10 AM End time: 03/08/2017 11:15 AM  Staffing Anesthesiologist: Bethena Midgetddono, Kathryn Linarez, MD  Preanesthetic Checklist Completed: patient identified, site marked, surgical consent, pre-op evaluation, timeout performed, IV checked, risks and benefits discussed and monitors and equipment checked  Epidural Patient position: sitting Prep: site prepped and draped and DuraPrep Patient monitoring: continuous pulse ox and blood pressure Approach: midline Location: L4-L5 Injection technique: LOR air  Needle:  Needle type: Tuohy  Needle gauge: 17 G Needle length: 9 cm and 9 Needle insertion depth: 6 cm Catheter type: closed end flexible Catheter size: 19 Gauge Catheter at skin depth: 11 cm Test dose: negative  Assessment Events: blood not aspirated, injection not painful, no injection resistance, negative IV test and no paresthesia

## 2017-03-08 NOTE — MAU Note (Signed)
Pt seen in MAU yesterday, DC'd home, states uc's have become more intense.  Reports gush of fluid around 0850 this morning, unsure of color of fluid.  Denies bright red bleeding.

## 2017-03-08 NOTE — Anesthesia Postprocedure Evaluation (Signed)
Anesthesia Post Note  Patient: Sydney [name]  Procedure(s) Performed: AN AD HOC LABOR EPIDURAL     Patient location during evaluation: Mother Baby Anesthesia Type: Epidural Level of consciousness: awake and alert Pain management: pain level controlled Vital Signs Assessment: post-procedure vital signs reviewed and stable Respiratory status: spontaneous breathing, nonlabored ventilation and respiratory function stable Cardiovascular status: stable Postop Assessment: no headache, no backache and epidural receding Anesthetic complications: no    Last Vitals:  Vitals:   03/08/17 1531 03/08/17 1547  BP: 132/60 (!) 138/57  Pulse: (!) 110 (!) 101  Resp:    Temp:      Last Pain:  Vitals:   03/08/17 1103  TempSrc:   PainSc: 10-Worst pain ever   Pain Goal: Patients Stated Pain Goal: 0 (03/08/17 0959)               Ainhoa Rallo

## 2017-03-08 NOTE — Progress Notes (Signed)
Pads changed and pericare given

## 2017-03-08 NOTE — Op Note (Signed)
Rolena InfanteKayla Lascola 03/08/2017  PREOPERATIVE DIAGNOSES: Multiparity, undesired fertility  POSTOPERATIVE DIAGNOSES: Multiparity, undesired fertility  PROCEDURE:  Postpartum Bilateral Tubal Sterilization using Filshie Clips   SURGEON: Dr.  Jaynie CollinsUgonna Avien Taha and Dr. Caryl AdaJazma Phelps  ANESTHESIA:  Epidural and local analgesia using 30 ml of 0.5% Marcaine  COMPLICATIONS:  None immediate.  ESTIMATED BLOOD LOSS: 15 ml.  FLUIDS: 500 ml LR.  INDICATIONS:  22 y.o. [id number] with undesired fertility,status post vaginal delivery, desires permanent sterilization.  Other reversible forms of contraception were discussed with patient; she declines all other modalities. Risks of procedure discussed with patient including but not limited to: risk of regret, permanence of method, bleeding, infection, injury to surrounding organs and need for additional procedures.  Failure risk of 1 -2 % with increased risk of ectopic gestation if pregnancy occurs was also discussed with patient.      FINDINGS:  Normal uterus, tubes, and ovaries.  PROCEDURE DETAILS: The patient was taken to the operating room where her epidural anesthesia was dosed up to surgical level and found to be adequate.  She was then placed in the dorsal supine position and prepped and draped in sterile fashion.  After an adequate timeout was performed, attention was turned to the patient's abdomen where a small transverse skin incision was made under the umbilical fold. The incision was taken down to the layer of fascia using the scalpel, and fascia was incised, and extended bilaterally using Mayo scissors. The peritoneum was entered in a sharp fashion. Attention was then turned to the patient's uterus, and left fallopian tube was identified and followed out to the fimbriated end.  A Filshie clip was placed on the left fallopian tube about 3 cm from the cornual attachment, with care given to incorporate the underlying mesosalpinx.  A similar process was carried out on  the right side allowing for bilateral tubal sterilization.  Good hemostasis was noted overall.  Local analgesia was injected into both Filshie application sites.The instruments were then removed from the patient's abdomen and the fascial incision was repaired with 0 Vicryl, and the skin was closed with a 4-0 Vicryl subcuticular stitch. The patient tolerated the procedure well.  Instrument, sponge, and needle counts were correct times two.  The patient was then taken to the recovery room awake and in stable condition.   Jaynie CollinsUGONNA  Hamda Klutts, MD, FACOG Attending Obstetrician & Gynecologist Faculty Practice, [facility]

## 2017-03-09 LAB — RPR: RPR Ser Ql: NONREACTIVE

## 2017-03-09 NOTE — Anesthesia Postprocedure Evaluation (Signed)
Anesthesia Post Note  Patient: Sydney [name]  Procedure(s) Performed: POST PARTUM TUBAL LIGATION (Bilateral Abdomen)     Patient location during evaluation: Mother Baby Anesthesia Type: Epidural Level of consciousness: awake and alert, oriented and patient cooperative Pain management: pain level controlled Vital Signs Assessment: post-procedure vital signs reviewed and stable Respiratory status: spontaneous breathing Cardiovascular status: stable Postop Assessment: no headache, epidural receding, patient able to bend at knees and no signs of nausea or vomiting Anesthetic complications: no Comments: Pain score 2.    Last Vitals:  Vitals:   03/09/17 0040 03/09/17 0520  BP: (!) 112/55 124/62  Pulse: 87 94  Resp: 18 18  Temp: 36.5 C 36.7 C  SpO2: 100% 99%    Last Pain:  Vitals:   03/09/17 0520  TempSrc: Oral  PainSc: 2    Pain Goal: Patients Stated Pain Goal: 0 (03/08/17 0959)               Merrilyn PumaWRINKLE,Zuma Hust

## 2017-03-09 NOTE — Anesthesia Postprocedure Evaluation (Signed)
Anesthesia Post Note  Patient: Rolena InfanteKayla Ligman  Procedure(s) Performed: AN AD HOC LABOR EPIDURAL     Patient location during evaluation: Mother Baby Anesthesia Type: Epidural Level of consciousness: awake and alert, oriented and patient cooperative Pain management: pain level controlled Vital Signs Assessment: post-procedure vital signs reviewed and stable Respiratory status: spontaneous breathing Cardiovascular status: stable Postop Assessment: no headache, epidural receding, patient able to bend at knees and no signs of nausea or vomiting Anesthetic complications: no Comments: Pain score 2.    Last Vitals:  Vitals:   03/09/17 0040 03/09/17 0520  BP: (!) 112/55 124/62  Pulse: 87 94  Resp: 18 18  Temp: 36.5 C 36.7 C  SpO2: 100% 99%    Last Pain:  Vitals:   03/09/17 0520  TempSrc: Oral  PainSc: 2    Pain Goal: Patients Stated Pain Goal: 0 (03/08/17 0959)               Merrilyn PumaWRINKLE,Desarai Barrack

## 2017-03-09 NOTE — Addendum Note (Signed)
Addendum  created 03/09/17 0830 by Angela AdamWrinkle, Silus Lanzo G, CRNA   Charge Capture section accepted, Sign clinical note

## 2017-03-09 NOTE — Progress Notes (Signed)
Post Partum Day 1 Vaginal delivery for SOL and BTL Subjective: no complaints, Patient is doing fairly well this morning no complaints. She is ambulating having minimal pain. No Head ache. Mild lochia. Breast feeding. Ambulating eating and drinking.   Objective: Blood pressure 124/62, pulse 94, temperature 98.1 F (36.7 C), temperature source Oral, resp. rate 18, height 5\' 4"  (1.626 m), weight 218 lb (98.9 kg), last menstrual period 05/28/2016, SpO2 99 %, unknown if currently breastfeeding.  Physical Exam:  General: alert, cooperative and appears stated age Lochia: appropriate Uterine Fundus: firm Incision: no significant drainage DVT Evaluation: No evidence of DVT seen on physical exam. Negative Homan's sign. No cords or calf tenderness.  Recent Labs    03/08/17 1058  HGB 12.4  HCT 37.5    Assessment/Plan: Plan for discharge tomorrow  Patient is PPD 1 from SVD and POD 1 from BTL  Ambulating Pain well controlled Continue to monitor    LOS: 1 day   Sydney [name] 03/09/2017, 6:57 AM

## 2017-03-09 NOTE — Addendum Note (Signed)
Addendum  created 03/09/17 0831 by Angela AdamWrinkle, Alexandrea Westergard G, CRNA   Charge Capture section accepted, Sign clinical note

## 2017-03-10 ENCOUNTER — Encounter (HOSPITAL_COMMUNITY): Payer: Self-pay | Admitting: Obstetrics & Gynecology

## 2017-03-10 ENCOUNTER — Other Ambulatory Visit: Payer: BLUE CROSS/BLUE SHIELD | Admitting: Obstetrics & Gynecology

## 2017-03-10 MED ORDER — IBUPROFEN 600 MG PO TABS: 600.0000 mg | ORAL_TABLET | Freq: Four times a day (QID) | ORAL | 0 refills | Status: DC

## 2017-03-10 NOTE — Discharge Summary (Signed)
OB Discharge Summary     Patient Name: Sydney [name] DOB: 1995/05/08 MRN: [id number]  Date of admission: 03/08/2017 Delivering MD: Pincus LargePHELPS, JAZMA Y   Date of discharge: 03/10/2017  Admitting diagnosis: 40 WKS WATER BROKE AND CTX Intrauterine pregnancy: 6369w4d     Secondary diagnosis:  Active Problems:   Normal labor   Encounter for induction of labor   SVD (spontaneous vaginal delivery)  Additional problems:  Patient Active Problem List   Diagnosis Date Noted  . Normal labor 03/08/2017  . Encounter for induction of labor 03/08/2017  . SVD (spontaneous vaginal delivery) 03/08/2017  . Fetal echogenic intracardiac focus on prenatal ultrasound 10/03/2016  . Low-lying placenta 10/03/2016  . Asymptomatic bacteriuria during pregnancy in first trimester 08/26/2016  . Supervision of normal pregnancy 08/21/2016  . History of acute pancreatitis 08/21/2016  . Family history of Down syndrome 08/21/2016  . Bilateral low back pain without sciatica 08/10/2015      Discharge diagnosis: Term Pregnancy Delivered and BTL                                                                                                Post partum procedures:BTL  Augmentation: none  Complications: None  Hospital course:  Onset of Labor With Vaginal Delivery     22 y.o. yo [id number] at 7369w4d was admitted in Active Labor on 03/08/2017. Patient had an uncomplicated labor course as follows:  Membrane Rupture Time/Date: 8:50 AM ,03/08/2017   Intrapartum Procedures: Episiotomy: None [1]                                         Lacerations:  1st degree [2];Vaginal [6]  Patient had a delivery of a Viable infant. 03/08/2017  Information for the patient's newborn:  Sydney [name], Girl Sydney [name] [[id number]][[id number]]  Delivery Method: Vag-Spont    Pateint had an uncomplicated postpartum course. Patient had a BTL post partum and doing well.   She is ambulating, tolerating a regular diet, passing flatus, and urinating well. Patient is discharged  home in stable condition on 03/10/17.   Physical exam  Vitals:   03/09/17 0922 03/09/17 1400 03/09/17 1717 03/10/17 0558  BP: 127/70 123/64 128/82 119/68  Pulse: 91 84 84 75  Resp: 20 18 16 18   Temp: 98.2 F (36.8 C) 98.2 F (36.8 C) 97.9 F (36.6 C) 98.1 F (36.7 C)  TempSrc: Oral Oral Axillary Oral  SpO2:  100% 99%   Weight:      Height:       General: alert, cooperative and no distress Lochia: appropriate Uterine Fundus: firm Incision: Healing well with no significant drainage DVT Evaluation: No evidence of DVT seen on physical exam. Negative Homan's sign. No cords or calf tenderness. Labs: Lab Results  Component Value Date   WBC 10.5 03/08/2017   HGB 12.4 03/08/2017   HCT 37.5 03/08/2017   MCV 74.6 (L) 03/08/2017   PLT 234 03/08/2017   CMP Latest Ref Rng & Units 11/21/2016  Glucose 65 - 99 mg/dL  100(H)  BUN 6 - 20 mg/dL 5(L)  Creatinine 1.61 - 1.00 mg/dL 0.96(E)  Sodium 454 - 098 mmol/L 137  Potassium 3.5 - 5.2 mmol/L 3.9  Chloride 96 - 106 mmol/L 100  CO2 20 - 29 mmol/L 20  Calcium 8.7 - 10.2 mg/dL 9.4  Total Protein 6.0 - 8.5 g/dL 6.9  Total Bilirubin 0.0 - 1.2 mg/dL 0.2  Alkaline Phos 39 - 117 IU/L 89  AST 0 - 40 IU/L 10  ALT 0 - 32 IU/L 8    Discharge instruction: per After Visit Summary and "Baby and Me Booklet".  After visit meds:  Allergies as of 03/10/2017      Reactions   Latex Rash      Medication List    STOP taking these medications   acetaminophen 500 MG tablet Commonly known as:  TYLENOL     TAKE these medications   ibuprofen 600 MG tablet Commonly known as:  ADVIL,MOTRIN Take 1 tablet (600 mg total) by mouth every 6 (six) hours.   Prenatal Vitamins 28-0.8 MG Tabs 1 tablet daily What changed:    how much to take  how to take this  when to take this  additional instructions            Discharge Care Instructions  (From admission, onward)        Start     Ordered   03/10/17 0000  Discharge wound care:     Comments:  Can remove bandage after being home for 2 days   03/10/17 0717      Diet: routine diet  Activity: Advance as tolerated. Pelvic rest for 6 weeks.   Outpatient follow up:4 weeks  Follow up Appt: Future Appointments  Date Time Provider Department Center  03/10/2017 10:00 AM Lazaro Arms, MD [facility]   Follow up Visit:No Follow-up on file.  Postpartum contraception: BTL postpartum  Newborn Data: Live born female  Birth Weight: 7 lb 9.5 oz (3445 g) APGAR: 9, 9  Newborn Delivery   Birth date/time:  03/08/2017 15:05:00 Delivery type:  Vaginal, Spontaneous     Baby Feeding: Breast Disposition:home with mother   03/10/2017 Sydney Bridgeman, MD  OB FELLOW DISCHARGE ATTESTATION  I have seen and examined this patient. I agree with above documentation and have made edits as needed.   Caryl Ada, DO

## 2017-03-11 ENCOUNTER — Inpatient Hospital Stay (HOSPITAL_COMMUNITY)
Admission: RE | Admit: 2017-03-11 | Discharge: 2017-03-11 | Disposition: A | Discharge status: Home / Self Care | Payer: BLUE CROSS/BLUE SHIELD | Source: Ambulatory Visit | Attending: Family Medicine | Admitting: Family Medicine

## 2017-04-14 ENCOUNTER — Ambulatory Visit (INDEPENDENT_AMBULATORY_CARE_PROVIDER_SITE_OTHER): Payer: BLUE CROSS/BLUE SHIELD | Admitting: Women's Health

## 2017-04-14 ENCOUNTER — Encounter: Payer: Self-pay | Admitting: Women's Health

## 2017-04-14 ENCOUNTER — Other Ambulatory Visit: Payer: Self-pay

## 2017-04-14 DIAGNOSIS — Z9851 Tubal ligation status: Secondary | ICD-10-CM | POA: Diagnosis not present

## 2017-04-14 NOTE — Progress Notes (Signed)
   POSTPARTUM VISIT Patient name: Sydney [name] MRN [id number]  Date of birth: Jun 19, 1995 Chief Complaint:   Postpartum Care  History of Present Illness:   Sydney [name] is a 22 y.o. 902P2002 Caucasian female being seen today for a postpartum visit. She is 5 weeks postpartum following a spontaneous vaginal delivery at 40.4 gestational weeks and BTL. Anesthesia: epidural. I have fully reviewed the prenatal and intrapartum course. Pregnancy uncomplicated. Postpartum course has been uncomplicated. Bleeding no bleeding. Bowel function is normal. Bladder function is normal.  Patient is not sexually active. Last sexual activity: prior to birth of baby.  Contraception method is tubal ligation.  [place] Postpartum Depression Screening: negative. Score 0.   Last pap 08/21/16.  Results were normal .  No LMP recorded.  Baby's course has been uncomplicated. Baby is feeding by bottle.  Review of Systems:   Pertinent items are noted in HPI Denies Abnormal vaginal discharge w/ itching/odor/irritation, headaches, visual changes, shortness of breath, chest pain, abdominal pain, severe nausea/vomiting, or problems with urination or bowel movements. Pertinent History Reviewed:  Reviewed past medical,surgical, obstetrical and family history.  Reviewed problem list, medications and allergies. OB History  Gravida Para Term Preterm AB Living  2 2 2     2   SAB TAB Ectopic Multiple Live Births        0 2    # Outcome Date GA Lbr Len/2nd Weight Sex Delivery Anes PTL Lv  2 Term 03/08/17 5056w4d 05:22 / 00:53 7 lb 9.5 oz (3.445 kg) F Vag-Spont EPI  LIV  1 Term 04/13/13 412w0d 14:04 / 00:53 6 lb 14.4 oz (3.13 kg) F Vag-Spont EPI N LIV     Physical Assessment:   Vitals:   04/14/17 1334  BP: 124/86  Pulse: 84  Weight: 206 lb (93.4 kg)  Height: 5\' 4"  (1.626 m)  Body mass index is 35.36 kg/m.       Physical Examination:   General appearance: alert, well appearing, and in no distress  Mental status: alert,  oriented to person, place, and time  Skin: warm & dry   Cardiovascular: normal heart rate noted   Respiratory: normal respiratory effort, no distress   Breasts: deferred, no complaints   Abdomen: soft, non-tender, BTL incision well-healed   Pelvic: VULVA: normal appearing vulva with no masses, tenderness or lesions, UTERUS: uterus is normal size, shape, consistency and nontender  Rectal: no hemorrhoids  Extremities: no edema       No results found for this or any previous visit (from the past 24 hour(s)).  Assessment & Plan:  1) Postpartum exam 2) 5 wks s/p SVB and in-hospital BTL 3) Bottlefeeding 4) Depression screening  Meds: No orders of the defined types were placed in this encounter.   Follow-up: Return for after 7/11 for physical.   No orders of the defined types were placed in this encounter.   Cheral MarkerKimberly R Trask Vosler CNM, [facility] Inc[facility]NP-BC 04/14/2017 1:55 PM

## 2017-04-15 ENCOUNTER — Encounter: Payer: Self-pay | Admitting: Women's Health

## 2017-05-10 ENCOUNTER — Encounter: Payer: Self-pay | Admitting: Family Medicine

## 2017-05-10 ENCOUNTER — Ambulatory Visit: Payer: Medicaid Other | Admitting: Family Medicine

## 2017-05-10 VITALS — BP 120/74 | HR 74 | Temp 97.0°F | Ht 64.0 in | Wt 209.4 lb

## 2017-05-10 DIAGNOSIS — N3000 Acute cystitis without hematuria: Secondary | ICD-10-CM | POA: Diagnosis not present

## 2017-05-10 MED ORDER — CIPROFLOXACIN HCL 250 MG PO TABS: 250.0000 mg | ORAL_TABLET | Freq: Two times a day (BID) | ORAL | 0 refills | Status: DC

## 2017-05-10 NOTE — Progress Notes (Signed)
   HPI  Patient presents today signs of UTI.  Patient states she has had 3 days of foul-smelling urine, dysuria, increased urinary frequency.  She denies fever, chills, sweats.  She is tolerating food and fluids like usual.  Patient has had a baby 2 months ago and has returned to being sexually active.  She is not breast-feeding  PMH: Smoking status noted ROS: Per HPI  Objective: BP 120/74   Pulse 74   Temp (!) 97 F (36.1 C) (Oral)   Ht 5\' 4"  (1.626 m)   Wt 209 lb 6.4 oz (95 kg)   BMI 35.94 kg/m  Gen: NAD, alert, cooperative with exam HEENT: NCAT CV: RRR, good S1/S2, no murmur Resp: CTABL, no wheezes, non-labored Abd: Soft, mild suprapubic tenderness, no CVA tenderness Ext: No edema, warm Neuro: Alert and oriented, No gross deficits  Assessment and plan:  #UTI Treated with Cipro, discussed usual course of illness, return to clinic with any concerns      Orders Placed This Encounter  Procedures  . Urinalysis    Meds ordered this encounter  Medications  . ciprofloxacin (CIPRO) 250 MG tablet    Sig: Take 1 tablet (250 mg total) by mouth 2 (two) times daily.    Dispense:  6 tablet    Refill:  0    Murtis SinkSam Marwa Fuhrman, MD [facility] 05/10/2017, 10:15 AM

## 2017-05-10 NOTE — Patient Instructions (Signed)
Great to see you!   Urinary Tract Infection, Adult A urinary tract infection (UTI) is an infection of any part of the urinary tract, which includes the kidneys, ureters, bladder, and urethra. These organs make, store, and get rid of urine in the body. UTI can be a bladder infection (cystitis) or kidney infection (pyelonephritis). What are the causes? This infection may be caused by fungi, viruses, or bacteria. Bacteria are the most common cause of UTIs. This condition can also be caused by repeated incomplete emptying of the bladder during urination. What increases the risk? This condition is more likely to develop if:  You ignore your need to urinate or hold urine for long periods of time.  You do not empty your bladder completely during urination.  You wipe back to front after urinating or having a bowel movement, if you are female.  You are uncircumcised, if you are female.  You are constipated.  You have a urinary catheter that stays in place (indwelling).  You have a weak defense (immune) system.  You have a medical condition that affects your bowels, kidneys, or bladder.  You have diabetes.  You take antibiotic medicines frequently or for long periods of time, and the antibiotics no longer work well against certain types of infections (antibiotic resistance).  You take medicines that irritate your urinary tract.  You are exposed to chemicals that irritate your urinary tract.  You are female.  What are the signs or symptoms? Symptoms of this condition include:  Fever.  Frequent urination or passing small amounts of urine frequently.  Needing to urinate urgently.  Pain or burning with urination.  Urine that smells bad or unusual.  Cloudy urine.  Pain in the lower abdomen or back.  Trouble urinating.  Blood in the urine.  Vomiting or being less hungry than normal.  Diarrhea or abdominal pain.  Vaginal discharge, if you are female.  How is this  diagnosed? This condition is diagnosed with a medical history and physical exam. You will also need to provide a urine sample to test your urine. Other tests may be done, including:  Blood tests.  Sexually transmitted disease (STD) testing.  If you have had more than one UTI, a cystoscopy or imaging studies may be done to determine the cause of the infections. How is this treated? Treatment for this condition often includes a combination of two or more of the following:  Antibiotic medicine.  Other medicines to treat less common causes of UTI.  Over-the-counter medicines to treat pain.  Drinking enough water to stay hydrated.  Follow these instructions at home:  Take over-the-counter and prescription medicines only as told by your health care provider.  If you were prescribed an antibiotic, take it as told by your health care provider. Do not stop taking the antibiotic even if you start to feel better.  Avoid alcohol, caffeine, tea, and carbonated beverages. They can irritate your bladder.  Drink enough fluid to keep your urine clear or pale yellow.  Keep all follow-up visits as told by your health care provider. This is important.  Make sure to: ? Empty your bladder often and completely. Do not hold urine for long periods of time. ? Empty your bladder before and after sex. ? Wipe from front to back after a bowel movement if you are female. Use each tissue one time when you wipe. Contact a health care provider if:  You have back pain.  You have a fever.  You feel nauseous or   vomit.  Your symptoms do not get better after 3 days.  Your symptoms go away and then return. Get help right away if:  You have severe back pain or lower abdominal pain.  You are vomiting and cannot keep down any medicines or water. This information is not intended to replace advice given to you by your health care provider. Make sure you discuss any questions you have with your health care  provider. Document Released: 11/07/2004 Document Revised: 07/12/2015 Document Reviewed: 12/19/2014 Elsevier Interactive Patient Education  2018 Elsevier Inc.  

## 2017-05-12 LAB — URINALYSIS
BILIRUBIN UA: NEGATIVE
GLUCOSE, UA: NEGATIVE
KETONES UA: NEGATIVE
Nitrite, UA: NEGATIVE
SPEC GRAV UA: 1.02 (ref 1.005–1.030)
Urobilinogen, Ur: 0.2 mg/dL (ref 0.2–1.0)
pH, UA: 6 (ref 5.0–7.5)

## 2018-01-14 ENCOUNTER — Ambulatory Visit (INDEPENDENT_AMBULATORY_CARE_PROVIDER_SITE_OTHER): Payer: Self-pay | Admitting: Physician Assistant

## 2018-01-14 ENCOUNTER — Encounter: Payer: Self-pay | Admitting: Physician Assistant

## 2018-01-14 VITALS — BP 116/76 | HR 102 | Temp 96.7°F | Ht 64.0 in | Wt 241.8 lb

## 2018-01-14 DIAGNOSIS — J209 Acute bronchitis, unspecified: Secondary | ICD-10-CM

## 2018-01-14 DIAGNOSIS — J0110 Acute frontal sinusitis, unspecified: Secondary | ICD-10-CM

## 2018-01-14 MED ORDER — BENZONATATE 200 MG PO CAPS: 200.0000 mg | ORAL_CAPSULE | Freq: Two times a day (BID) | ORAL | 0 refills | Status: DC | PRN

## 2018-01-14 MED ORDER — PREDNISONE 10 MG (21) PO TBPK: ORAL_TABLET | ORAL | 0 refills | Status: DC

## 2018-01-14 MED ORDER — AMOXICILLIN-POT CLAVULANATE 875-125 MG PO TABS: 1.0000 | ORAL_TABLET | Freq: Two times a day (BID) | ORAL | 0 refills | Status: DC

## 2018-01-14 MED ORDER — FLUCONAZOLE 150 MG PO TABS: 150.0000 mg | ORAL_TABLET | Freq: Once | ORAL | 0 refills | Status: AC

## 2018-01-14 NOTE — Progress Notes (Signed)
BP 116/76   Pulse (!) 102   Temp (!) 96.7 F (35.9 C) (Oral)   Ht 5\' 4"  (1.626 m)   Wt 241 lb 12.8 oz (109.7 kg)   BMI 41.50 kg/m    Subjective:    Patient ID: Sydney [name], female    DOB: 1996-01-07, 22 y.o.   MRN: [id number]  HPI: Sydney [name] is a 22 y.o. female presenting on 01/14/2018 for Fever; Generalized Body Aches; and Sore Throat  This patient has had many days of sore throat and postnasal drainage, headache at times and sinus pressure. There is copious drainage at times. Denies any fever at this time. There has been a history of sinus infections in the past.  There is cough at night. It has become more prevalent in recent days.   Past Medical History:  Diagnosis Date  . Bronchitis   . Family history of anesthesia complication    grandparents  reaction unknown  . Gallstones   . Pneumonia    Relevant past medical, surgical, family and social history reviewed and updated as indicated. Interim medical history since our last visit reviewed. Allergies and medications reviewed and updated. DATA REVIEWED: CHART IN EPIC  Family History reviewed for pertinent findings.  Review of Systems  Constitutional: Positive for chills, fatigue and fever. Negative for activity change and appetite change.  HENT: Positive for congestion, postnasal drip, sinus pressure, sinus pain and sore throat.   Eyes: Negative.   Respiratory: Positive for cough. Negative for shortness of breath and wheezing.   Cardiovascular: Negative.  Negative for chest pain, palpitations and leg swelling.  Gastrointestinal: Negative.   Genitourinary: Negative.   Musculoskeletal: Negative.   Skin: Negative.   Neurological: Positive for headaches.    Allergies as of 01/14/2018      Reactions   Latex Rash      Medication List        Accurate as of 01/14/18 12:24 PM. Always use your most recent med list.          amoxicillin-clavulanate 875-125 MG tablet Commonly known as:  AUGMENTIN Take 1 tablet by  mouth 2 (two) times daily.   benzonatate 200 MG capsule Commonly known as:  TESSALON Take 1 capsule (200 mg total) by mouth 2 (two) times daily as needed for cough.   fluconazole 150 MG tablet Commonly known as:  DIFLUCAN Take 1 tablet (150 mg total) by mouth once for 1 dose.   ibuprofen 600 MG tablet Commonly known as:  ADVIL,MOTRIN Take 1 tablet (600 mg total) by mouth every 6 (six) hours.   predniSONE 10 MG (21) Tbpk tablet Commonly known as:  STERAPRED UNI-PAK 21 TAB As directed x 6 days          Objective:    BP 116/76   Pulse (!) 102   Temp (!) 96.7 F (35.9 C) (Oral)   Ht 5\' 4"  (1.626 m)   Wt 241 lb 12.8 oz (109.7 kg)   BMI 41.50 kg/m   Allergies  Allergen Reactions  . Latex Rash    Wt Readings from Last 3 Encounters:  01/14/18 241 lb 12.8 oz (109.7 kg)  05/10/17 209 lb 6.4 oz (95 kg)  04/14/17 206 lb (93.4 kg)    Physical Exam  Constitutional: She is oriented to person, place, and time. She appears well-developed and well-nourished.  HENT:  Head: Normocephalic and atraumatic.  Right Ear: Tympanic membrane and external ear normal. No middle ear effusion.  Left Ear: Tympanic membrane and external  ear normal.  No middle ear effusion.  Nose: Mucosal edema and rhinorrhea present. Right sinus exhibits no maxillary sinus tenderness. Left sinus exhibits no maxillary sinus tenderness.  Mouth/Throat: Uvula is midline. Posterior oropharyngeal erythema present.  Eyes: Pupils are equal, round, and reactive to light. Conjunctivae and EOM are normal. Right eye exhibits no discharge. Left eye exhibits no discharge.  Neck: Normal range of motion.  Cardiovascular: Normal rate, regular rhythm and normal heart sounds.  Pulmonary/Chest: Effort normal and breath sounds normal. No respiratory distress. She has no wheezes.  Abdominal: Soft.  Lymphadenopathy:    She has no cervical adenopathy.  Neurological: She is alert and oriented to person, place, and time.  Skin: Skin is  warm and dry.  Psychiatric: She has a normal mood and affect.        Assessment & Plan:   1. Acute non-recurrent frontal sinusitis - amoxicillin-clavulanate (AUGMENTIN) 875-125 MG tablet; Take 1 tablet by mouth 2 (two) times daily.  Dispense: 20 tablet; Refill: 0 - fluconazole (DIFLUCAN) 150 MG tablet; Take 1 tablet (150 mg total) by mouth once for 1 dose.  Dispense: 1 tablet; Refill: 0  2. Bronchitis, acute, with bronchospasm - amoxicillin-clavulanate (AUGMENTIN) 875-125 MG tablet; Take 1 tablet by mouth 2 (two) times daily.  Dispense: 20 tablet; Refill: 0 - predniSONE (STERAPRED UNI-PAK 21 TAB) 10 MG (21) TBPK tablet; As directed x 6 days  Dispense: 21 tablet; Refill: 0 - benzonatate (TESSALON) 200 MG capsule; Take 1 capsule (200 mg total) by mouth 2 (two) times daily as needed for cough.  Dispense: 20 capsule; Refill: 0   Continue all other maintenance medications as listed above.  Follow up plan: No follow-ups on file.  Educational handout given for survey  Remus Loffler PA-C [facility] [address]  [place], [place] [address] [phone]   01/14/2018, 12:24 PM

## 2018-05-10 ENCOUNTER — Other Ambulatory Visit: Payer: Self-pay

## 2018-05-10 ENCOUNTER — Emergency Department (HOSPITAL_COMMUNITY)
Admission: EM | Admit: 2018-05-10 | Discharge: 2018-05-10 | Disposition: A | Discharge status: Home / Self Care | Payer: Self-pay | Attending: Emergency Medicine | Admitting: Emergency Medicine

## 2018-05-10 ENCOUNTER — Encounter (HOSPITAL_COMMUNITY): Payer: Self-pay | Admitting: Emergency Medicine

## 2018-05-10 ENCOUNTER — Emergency Department (HOSPITAL_COMMUNITY): Payer: Self-pay

## 2018-05-10 DIAGNOSIS — Y939 Activity, unspecified: Secondary | ICD-10-CM | POA: Insufficient documentation

## 2018-05-10 DIAGNOSIS — S93602A Unspecified sprain of left foot, initial encounter: Secondary | ICD-10-CM | POA: Insufficient documentation

## 2018-05-10 DIAGNOSIS — S82831A Other fracture of upper and lower end of right fibula, initial encounter for closed fracture: Secondary | ICD-10-CM | POA: Insufficient documentation

## 2018-05-10 DIAGNOSIS — Y999 Unspecified external cause status: Secondary | ICD-10-CM | POA: Insufficient documentation

## 2018-05-10 DIAGNOSIS — Y92838 Other recreation area as the place of occurrence of the external cause: Secondary | ICD-10-CM | POA: Insufficient documentation

## 2018-05-10 DIAGNOSIS — Z87891 Personal history of nicotine dependence: Secondary | ICD-10-CM | POA: Insufficient documentation

## 2018-05-10 DIAGNOSIS — W16722A Jumping or diving from boat striking bottom causing other injury, initial encounter: Secondary | ICD-10-CM | POA: Insufficient documentation

## 2018-05-10 DIAGNOSIS — Z91040 Latex allergy status: Secondary | ICD-10-CM | POA: Insufficient documentation

## 2018-05-10 DIAGNOSIS — Y9319 Activity, other involving water and watercraft: Secondary | ICD-10-CM | POA: Insufficient documentation

## 2018-05-10 MED ORDER — HYDROCODONE-ACETAMINOPHEN 5-325 MG PO TABS: 1.0000 | ORAL_TABLET | ORAL | 0 refills | Status: DC | PRN

## 2018-05-10 NOTE — ED Triage Notes (Signed)
Patient complains of bilateral ankle pain from jumping [place] of [place]. Right side is more affected than the left and the patient reports she is unable to bear weight on that ankle.
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
## 2018-05-11 ENCOUNTER — Telehealth: Payer: Self-pay | Admitting: Orthopedic Surgery

## 2018-05-11 NOTE — Telephone Encounter (Signed)
I have contacted patient and relayed Dr. Mort Sawyers instructions to her. We went over them several times. She stated she understood and will come in on 05/18/18 @ 9 am.

## 2018-05-11 NOTE — Telephone Encounter (Signed)
No weight bearing   Elevate   Apply ice   Take ibuprofen for pain   F/U in office Monday April 6

## 2018-05-11 NOTE — Telephone Encounter (Signed)
Patient went to ER (05/10/18) was diagnosed with a distal fibular fracture with mild displacement and sprained Left foot. She was referred to you for evaluation.  Patient is aware of the protocol for self pay patients.  Please review and advise.

## 2018-05-18 ENCOUNTER — Other Ambulatory Visit: Payer: Self-pay

## 2018-05-18 ENCOUNTER — Encounter: Payer: Self-pay | Admitting: Orthopedic Surgery

## 2018-05-18 ENCOUNTER — Ambulatory Visit (INDEPENDENT_AMBULATORY_CARE_PROVIDER_SITE_OTHER): Payer: Medicaid Other

## 2018-05-18 ENCOUNTER — Ambulatory Visit: Payer: Self-pay | Admitting: Orthopedic Surgery

## 2018-05-18 ENCOUNTER — Other Ambulatory Visit: Payer: Self-pay | Admitting: Orthopedic Surgery

## 2018-05-18 VITALS — BP 117/82 | HR 98 | Temp 98.3°F | Ht 64.0 in | Wt 240.0 lb

## 2018-05-18 DIAGNOSIS — S82831A Other fracture of upper and lower end of right fibula, initial encounter for closed fracture: Secondary | ICD-10-CM

## 2018-05-18 DIAGNOSIS — S82832A Other fracture of upper and lower end of left fibula, initial encounter for closed fracture: Secondary | ICD-10-CM

## 2018-05-18 DIAGNOSIS — S93402A Sprain of unspecified ligament of left ankle, initial encounter: Secondary | ICD-10-CM

## 2018-05-18 DIAGNOSIS — IMO0001 Reserved for inherently not codable concepts without codable children: Secondary | ICD-10-CM

## 2018-05-18 NOTE — Patient Instructions (Signed)
Work status out of work 4 weeks Medication ibuprofen Weightbearing status as tolerated both lower extremities Follow-up 1 week
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
## 2018-05-19 DIAGNOSIS — S82831A Other fracture of upper and lower end of right fibula, initial encounter for closed fracture: Secondary | ICD-10-CM | POA: Insufficient documentation

## 2018-05-25 ENCOUNTER — Other Ambulatory Visit: Payer: Self-pay

## 2018-05-25 ENCOUNTER — Ambulatory Visit (INDEPENDENT_AMBULATORY_CARE_PROVIDER_SITE_OTHER): Payer: Medicaid Other

## 2018-05-25 ENCOUNTER — Ambulatory Visit (INDEPENDENT_AMBULATORY_CARE_PROVIDER_SITE_OTHER): Payer: Medicaid Other | Admitting: Orthopedic Surgery

## 2018-05-25 ENCOUNTER — Encounter: Payer: Self-pay | Admitting: Orthopedic Surgery

## 2018-05-25 VITALS — Ht 64.0 in | Wt 240.0 lb

## 2018-05-25 DIAGNOSIS — S82831D Other fracture of upper and lower end of right fibula, subsequent encounter for closed fracture with routine healing: Secondary | ICD-10-CM

## 2018-05-25 DIAGNOSIS — IMO0001 Reserved for inherently not codable concepts without codable children: Secondary | ICD-10-CM

## 2018-05-25 DIAGNOSIS — S93402D Sprain of unspecified ligament of left ankle, subsequent encounter: Secondary | ICD-10-CM

## 2018-05-25 DIAGNOSIS — S93402A Sprain of unspecified ligament of left ankle, initial encounter: Secondary | ICD-10-CM | POA: Insufficient documentation

## 2018-05-25 NOTE — Progress Notes (Signed)
Follow-up office visit  Encounter Diagnoses  Name Primary?  . Other closed fracture of distal end of right fibula with routine healing, subsequent encounter 05/10/18 Yes  . Sprain of ankle, first degree, left, subsequent encounter    Sydney [name] complains of pain in the left ankle more than the right  Bilateral ankle injuries right fibular fracture for x-ray today  Pain is controlled  CAM Walker on the right ASO brace on the left  X-ray shows stable mortise fracture Weber B fibula right ankle  Exam both ankles have stable anterior talofibular ligaments there is tenderness on the right ankle on the fibula as well as the left ankle and the anterior talofibular ligament neurovascular exam intact bilaterally  X-ray report shows stable mortise as stated  Recommend weightbearing as tolerated in each area and follow-up in x-ray 4 weeks

## 2018-06-22 ENCOUNTER — Ambulatory Visit: Payer: Self-pay | Admitting: Orthopedic Surgery

## 2018-06-22 ENCOUNTER — Ambulatory Visit (INDEPENDENT_AMBULATORY_CARE_PROVIDER_SITE_OTHER): Payer: Medicaid Other

## 2018-06-22 ENCOUNTER — Encounter: Payer: Self-pay | Admitting: Orthopedic Surgery

## 2018-06-22 ENCOUNTER — Other Ambulatory Visit: Payer: Self-pay

## 2018-06-22 VITALS — Temp 95.8°F | Ht 64.0 in | Wt 240.0 lb

## 2018-06-22 DIAGNOSIS — IMO0001 Reserved for inherently not codable concepts without codable children: Secondary | ICD-10-CM

## 2018-06-22 DIAGNOSIS — S82831D Other fracture of upper and lower end of right fibula, subsequent encounter for closed fracture with routine healing: Secondary | ICD-10-CM

## 2018-06-22 DIAGNOSIS — S93402D Sprain of unspecified ligament of left ankle, subsequent encounter: Secondary | ICD-10-CM

## 2018-06-22 NOTE — Patient Instructions (Signed)
Remove brace on left and then on the rt its optional  Be careful on uneven ground

## 2018-06-22 NOTE — Progress Notes (Signed)
Patient ID: Sydney [name], female   DOB: 02/24/1995, 23 y.o.   MRN: [id number]  FRACTURE CARE   Chief Complaint  Patient presents with  . Ankle Injury    right 05/10/18 right ankle fracture improving  . Foot Pain    sprain left foot still painful (*wearing flip flop)    Encounter Diagnosis  Name Primary?  . Other closed fracture of distal end of right fibula with routine healing, subsequent encounter 05/10/18 Yes    CURRENT TREATMENT : Right side cam walker left side ASO brace  POST INJURY DAY: 6 weeks or 43 days  GLOBAL PERIOD DAY first office visit April 6/90  She has some soreness on the right fibula more pain on the left ankle  She has a positive drawer test on the left.  Mild soreness on the right.  X-ray shows a fracture is nondisplaced has some callus forming ankle mortise is intact  Plan Remove brace on left and then on the rt its optional  Be careful on uneven ground

## 2018-10-30 ENCOUNTER — Other Ambulatory Visit: Payer: Self-pay

## 2018-10-30 ENCOUNTER — Ambulatory Visit (INDEPENDENT_AMBULATORY_CARE_PROVIDER_SITE_OTHER)
Admission: RE | Admit: 2018-10-30 | Discharge: 2018-10-30 | Disposition: A | Discharge status: Home / Self Care | Payer: Self-pay | Source: Ambulatory Visit

## 2018-10-30 DIAGNOSIS — J22 Unspecified acute lower respiratory infection: Secondary | ICD-10-CM

## 2018-10-30 MED ORDER — AMOXICILLIN-POT CLAVULANATE 875-125 MG PO TABS: 1.0000 | ORAL_TABLET | Freq: Two times a day (BID) | ORAL | 0 refills | Status: DC

## 2018-10-30 MED ORDER — BENZONATATE 100 MG PO CAPS: 100.0000 mg | ORAL_CAPSULE | Freq: Three times a day (TID) | ORAL | 0 refills | Status: DC

## 2018-10-30 NOTE — ED Provider Notes (Signed)
Virtual Visit via Video Note:  Sydney [name]  initiated request for Telemedicine visit with [facility] Urgent [facility] team. I connected with Sydney [name]  on 10/30/2018 at 9:53 AM  for a synchronized telemedicine visit using a video enabled HIPPA compliant telemedicine application. I verified that I am speaking with Sydney [name]  using two identifiers. Orvan July, NP  was physically located in a [facility] Urgent care site and Sydney [name] was located at a different location.   The limitations of evaluation and management by telemedicine as well as the availability of in-person appointments were discussed. Patient was informed that she  may incur a bill ( including co-pay) for this virtual visit encounter. Sydney [name]  expressed understanding and gave verbal consent to proceed with virtual visit.     History of Present Illness:Sydney [name]  is a 23 y.o. female presents with over 2 weeks of productive cough. Hx of bronchitis and PNA. Symptoms have been constant, waxing and waning.  Sputum is green. Some burning in chest and SOB with coughing and exertion. Intermittent fever. Taking Mucinex, sudafed. She has also had some nasal congestion and drainage. No recent sick contacts or recent travels.   Past Medical History:  Diagnosis Date  . Bronchitis   . Family history of anesthesia complication    grandparents  reaction unknown  . Gallstones   . Pneumonia     Allergies  Allergen Reactions  . Latex Rash        Observations/Objective: VITALS: Per patient if applicable, see vitals. GENERAL: Alert, appears well and in no acute distress. HEENT: Atraumatic, conjunctiva clear, no obvious abnormalities on inspection of external nose and ears. NECK: Normal movements of the head and neck. CARDIOPULMONARY: No increased WOB. Speaking in clear sentences. I:E ratio WNL.  MS: Moves all visible extremities without noticeable abnormality. PSYCH: Pleasant and cooperative, well-groomed. Speech normal rate  and rhythm. Affect is appropriate. Insight and judgement are appropriate. Attention is focused, linear, and appropriate.  NEURO: CN grossly intact. Oriented as arrived to appointment on time with no prompting. Moves both UE equally.  SKIN: No obvious lesions, wounds, erythema, or cyanosis noted on face or hands.     Assessment and Plan: Treating for lower respiratory tract infection based on sputum production and over 2 weeks of symptoms. Augmentin for infection and tessalon pearls for cough. Continue the mucinex. Tylenol or ibuprofen as needed for fever/pain. Sudafed for nasal congestion.      Follow Up Instructions:  Follow up as needed for continued or worsening symptoms     I discussed the assessment and treatment plan with the patient. The patient was provided an opportunity to ask questions and all were answered. The patient agreed with the plan and demonstrated an understanding of the instructions.   The patient was advised to call back or seek an in-person evaluation if the symptoms worsen or if the condition fails to improve as anticipated.   Orvan July, NP  10/30/2018 9:53 AM         Orvan July, NP 10/30/18 [phone]

## 2018-10-30 NOTE — Discharge Instructions (Signed)
Treating you for a respiratory infection Take the medication as prescribed Keep taking the mucinex and tessalon pearls as needed for cough.  Follow up as needed for continued or worsening symptoms

## 2019-02-09 ENCOUNTER — Inpatient Hospital Stay: Admission: RE | Admit: 2019-02-09 | Payer: Self-pay | Source: Ambulatory Visit

## 2019-03-19 ENCOUNTER — Ambulatory Visit: Payer: Medicaid Other | Attending: Internal Medicine

## 2019-03-19 DIAGNOSIS — Z20822 Contact with and (suspected) exposure to covid-19: Secondary | ICD-10-CM

## 2019-03-20 LAB — NOVEL CORONAVIRUS, NAA: SARS-CoV-2, NAA: NOT DETECTED

## 2019-06-14 ENCOUNTER — Encounter: Payer: Self-pay | Admitting: Family Medicine

## 2019-06-14 ENCOUNTER — Telehealth (INDEPENDENT_AMBULATORY_CARE_PROVIDER_SITE_OTHER): Payer: Self-pay | Admitting: Family Medicine

## 2019-06-14 DIAGNOSIS — H9201 Otalgia, right ear: Secondary | ICD-10-CM

## 2019-06-14 MED ORDER — PREDNISONE 10 MG PO TABS: ORAL_TABLET | ORAL | 0 refills | Status: DC

## 2019-06-14 NOTE — Patient Instructions (Signed)
I think you are likely experiencing eustachian tube dysfunction.  I would like you to start Claritin or Zyrtec (generic is fine) and take daily.  This will please cover for the allergy mediated side of this condition.  I am putting you on a short course of prednisone to see if we can reduce inflammation.  You can use Dramamine/meclizine OTC to help with dizzy spells if they happen.  Make sure you are hydrating adequately.  If symptoms worsen for any reason I suspect that there is a secondary infection developing, please message me and I will send you an antibiotic in.  Otherwise if symptoms simply do not respond and you continue to not have infectious symptoms, and you decide that you want to see an ear nose and throat doctor, please shoot me a message and I will put the referral in as well.  Eustachian Tube Dysfunction  Eustachian tube dysfunction refers to a condition in which a blockage develops in the narrow passage that connects the middle ear to the back of the nose (eustachian tube). The eustachian tube regulates air pressure in the middle ear by letting air move between the ear and nose. It also helps to drain fluid from the middle ear space. Eustachian tube dysfunction can affect one or both ears. When the eustachian tube does not function properly, air pressure, fluid, or both can build up in the middle ear. What are the causes? This condition occurs when the eustachian tube becomes blocked or cannot open normally. Common causes of this condition include:  Ear infections.  Colds and other infections that affect the nose, mouth, and throat (upper respiratory tract).  Allergies.  Irritation from cigarette smoke.  Irritation from stomach acid coming up into the esophagus (gastroesophageal reflux). The esophagus is the tube that carries food from the mouth to the stomach.  Sudden changes in air pressure, such as from descending in an airplane or scuba diving.  Abnormal growths in the nose  or throat, such as: ? Growths that line the nose (nasal polyps). ? Abnormal growth of cells (tumors). ? Enlarged tissue at the back of the throat (adenoids). What increases the risk? You are more likely to develop this condition if:  You smoke.  You are overweight.  You are a child who has: ? Certain birth defects of the mouth, such as cleft palate. ? Large tonsils or adenoids. What are the signs or symptoms? Common symptoms of this condition include:  A feeling of fullness in the ear.  Ear pain.  Clicking or popping noises in the ear.  Ringing in the ear.  Hearing loss.  Loss of balance.  Dizziness. Symptoms may get worse when the air pressure around you changes, such as when you travel to an area of high elevation, fly on an airplane, or go scuba diving. How is this diagnosed? This condition may be diagnosed based on:  Your symptoms.  A physical exam of your ears, nose, and throat.  Tests, such as those that measure: ? The movement of your eardrum (tympanogram). ? Your hearing (audiometry). How is this treated? Treatment depends on the cause and severity of your condition.  In mild cases, you may relieve your symptoms by moving air into your ears. This is called "popping the ears."  In more severe cases, or if you have symptoms of fluid in your ears, treatment may include: ? Medicines to relieve congestion (decongestants). ? Medicines that treat allergies (antihistamines). ? Nasal sprays or ear drops that contain medicines  that reduce swelling (steroids). ? A procedure to drain the fluid in your eardrum (myringotomy). In this procedure, a small tube is placed in the eardrum to:  Drain the fluid.  Restore the air in the middle ear space. ? A procedure to insert a balloon device through the nose to inflate the opening of the eustachian tube (balloon dilation). Follow these instructions at home: Lifestyle  Do not do any of the following until your health care  provider approves: ? Travel to high altitudes. ? Fly in airplanes. ? Work in a Pension scheme manager or room. ? Scuba dive.  Do not use any products that contain nicotine or tobacco, such as cigarettes and e-cigarettes. If you need help quitting, ask your health care provider.  Keep your ears dry. Wear fitted earplugs during showering and bathing. Dry your ears completely after. General instructions  Take over-the-counter and prescription medicines only as told by your health care provider.  Use techniques to help pop your ears as recommended by your health care provider. These may include: ? Chewing gum. ? Yawning. ? Frequent, forceful swallowing. ? Closing your mouth, holding your nose closed, and gently blowing as if you are trying to blow air out of your nose.  Keep all follow-up visits as told by your health care provider. This is important. Contact a health care provider if:  Your symptoms do not go away after treatment.  Your symptoms come back after treatment.  You are unable to pop your ears.  You have: ? A fever. ? Pain in your ear. ? Pain in your head or neck. ? Fluid draining from your ear.  Your hearing suddenly changes.  You become very dizzy.  You lose your balance. Summary  Eustachian tube dysfunction refers to a condition in which a blockage develops in the eustachian tube.  It can be caused by ear infections, allergies, inhaled irritants, or abnormal growths in the nose or throat.  Symptoms include ear pain, hearing loss, or ringing in the ears.  Mild cases are treated with maneuvers to unblock the ears, such as yawning or ear popping.  Severe cases are treated with medicines. Surgery may also be done (rare). This information is not intended to replace advice given to you by your health care provider. Make sure you discuss any questions you have with your health care provider. Document Revised: 05/20/2017 Document Reviewed: 05/20/2017 Elsevier Patient  Education  [place].

## 2019-06-14 NOTE — Progress Notes (Signed)
MyChart Video visit  Subjective: CC: ear pain PCP: Raliegh Ip, DO LOV:FIEPP Sydney [name] is a 24 y.o. female. Patient provides verbal consent for consult held via video.  Due to COVID-19 pandemic this visit was conducted virtually. This visit type was conducted due to national recommendations for restrictions regarding the COVID-19 Pandemic (e.g. social distancing, sheltering in place) in an effort to limit this patient's exposure and mitigate transmission in our community. All issues noted in this document were discussed and addressed.  A physical exam was not performed with this format.   Location of patient: home Location of provider: [facility] Others present for call: none  1. Ear pain Patient reports onset of right sided ear pain (pressure and fullness) and dizziness for at least 3 weeks but has gotten worse recently.  She reports decreased hearing on right. No drainage or fevers.  No trauma.  Denies rhinorrhea, watery eyes.  She had some associated throat irritation that resolved.  She has used Motrin but not specifically for this.    ROS: Per HPI  Allergies  Allergen Reactions  . Latex Rash   Past Medical History:  Diagnosis Date  . Bronchitis   . Family history of anesthesia complication    grandparents  reaction unknown  . Gallstones   . Pneumonia     Current Outpatient Medications:  .  amoxicillin-clavulanate (AUGMENTIN) 875-125 MG tablet, Take 1 tablet by mouth every 12 (twelve) hours., Disp: 14 tablet, Rfl: 0 .  benzonatate (TESSALON) 100 MG capsule, Take 1 capsule (100 mg total) by mouth every 8 (eight) hours., Disp: 21 capsule, Rfl: 0 .  ibuprofen (ADVIL,MOTRIN) 600 MG tablet, Take 1 tablet (600 mg total) by mouth every 6 (six) hours. (Patient not taking: Reported on 06/22/2018), Disp: 30 tablet, Rfl: 0  Gen: well appearing female, NAD HEENT: no rhinorrhea, sclera white Pulmonary: normal work of breathing on room air Neuro: no nystagmus appreciated  Assessment/  Plan: 24 y.o. female   1. Right ear pain Likely eustachian tube dysfunction.  Start Claritin.  Prednisone Dosepak sent.  Home care instructions reviewed with the patient.  Okay to use meclizine if needed OTC.  If symptoms do not respond to the treatment, may need to consider ENT referral.  If symptoms worsen or she develops infectious signs or symptoms she will contact me at which point we will send empiric antibiotics.  She will follow as needed - predniSONE (DELTASONE) 10 MG tablet; Take 60mg  by mouth day 1, 50mg  day 2, 40mg  day 3, 30mg  day 4, 20mg  day 5, 10mg  day 6.  Then stop.  Dispense: 21 tablet; Refill: 0   Start time: 5:31pm End time: 5:41pm  Total time spent on patient care (including video visit/ documentation): 15 minutes  Landin Tallon , DO [facility] [phone]

## 2019-12-10 ENCOUNTER — Telehealth: Payer: Medicaid Other | Admitting: Emergency Medicine

## 2019-12-10 DIAGNOSIS — J019 Acute sinusitis, unspecified: Secondary | ICD-10-CM

## 2019-12-10 MED ORDER — IPRATROPIUM BROMIDE 0.03 % NA SOLN: 2.0000 | Freq: Three times a day (TID) | NASAL | 0 refills | Status: DC

## 2019-12-10 MED ORDER — DOXYCYCLINE HYCLATE 100 MG PO CAPS: 100.0000 mg | ORAL_CAPSULE | Freq: Two times a day (BID) | ORAL | 0 refills | Status: DC

## 2019-12-10 NOTE — Progress Notes (Signed)
We are sorry that you are not feeling well.  Here is how we plan to help!   **Please do not respond to this message unless you have follow up questions.**  Based on what you have shared with me it looks like you have sinusitis.  Sinusitis is inflammation and infection in the sinus cavities of the head.  Based on your presentation I believe you most likely have Acute Viral Sinusitis.This is an infection most likely caused by a virus. There is not specific treatment for viral sinusitis other than to help you with the symptoms until the infection runs its course.  You may use an oral decongestant such as Mucinex D or if you have glaucoma or high blood pressure use plain Mucinex. Saline nasal spray help and can safely be used as often as needed for congestion, I have prescribed: Ipratropium Bromide nasal spray 0.03% 2 sprays in eah nostril 2-3 times a day  Some authorities believe that zinc sprays or the use of Echinacea may shorten the course of your symptoms.  Sinus infections are not as easily transmitted as other respiratory infection, however we still recommend that you avoid close contact with loved ones, especially the very young and elderly.  Remember to wash your hands thoroughly throughout the day as this is the number one way to prevent the spread of infection!  Home Care:  Only take medications as instructed by your medical team.  Do not take these medications with alcohol.  A steam or ultrasonic humidifier can help congestion.  You can place a towel over your head and breathe in the steam from hot water coming from a faucet.  Avoid close contacts especially the very young and the elderly.  Cover your mouth when you cough or sneeze.  Always remember to wash your hands.  Get Help Right Away If:  You develop worsening fever or sinus pain.  You develop a severe head ache or visual changes.  Your symptoms persist after you have completed your treatment plan.  Make sure  you  Understand these instructions.  Will watch your condition.  Will get help right away if you are not doing well or get worse.  Your e-visit answers were reviewed by a board certified advanced clinical practitioner to complete your personal care plan.  Depending on the condition, your plan could have included both over the counter or prescription medications.  If there is a problem please reply  once you have received a response from your provider.  Your safety is important to [place].  If you have drug allergies check your prescription carefully.    You can use MyChart to ask questions about today's visit, request a non-urgent call back, or ask for a work or school excuse for 24 hours related to this e-Visit. If it has been greater than 24 hours you will need to follow up with your provider, or enter a new e-Visit to address those concerns.  You will get an e-mail in the next two days asking about your experience.  I hope that your e-visit has been valuable and will speed your recovery. Thank you for using e-visits.    Greater than 5 but less than 10 minutes spent researching, coordinating, and implementing care for this patient today

## 2019-12-10 NOTE — Addendum Note (Signed)
Addended by: Arthor Captain on: 12/10/2019 02:03 PM   Modules accepted: Orders

## 2020-01-13 ENCOUNTER — Telehealth: Payer: Medicaid Other | Admitting: Family

## 2020-01-13 DIAGNOSIS — B379 Candidiasis, unspecified: Secondary | ICD-10-CM

## 2020-01-13 DIAGNOSIS — N760 Acute vaginitis: Secondary | ICD-10-CM

## 2020-01-13 DIAGNOSIS — B9689 Other specified bacterial agents as the cause of diseases classified elsewhere: Secondary | ICD-10-CM

## 2020-01-13 MED ORDER — FLUCONAZOLE 150 MG PO TABS: ORAL_TABLET | ORAL | 0 refills | Status: DC

## 2020-01-13 MED ORDER — METRONIDAZOLE 500 MG PO TABS: 500.0000 mg | ORAL_TABLET | Freq: Two times a day (BID) | ORAL | 0 refills | Status: AC

## 2020-01-13 NOTE — Progress Notes (Signed)
We are sorry that you are not feeling well. Here is how we plan to help! Based on what you shared with me it looks like you: May have a vaginosis due to bacteria and yeast.   Vaginosis is an inflammation of the vagina that can result in discharge, itching and pain. The cause is usually a change in the normal balance of vaginal bacteria or an infection. Vaginosis can also result from reduced estrogen levels after menopause.  The most common causes of vaginosis are:   Bacterial vaginosis which results from an overgrowth of one on several organisms that are normally present in your vagina.   Yeast infections which are caused by a naturally occurring fungus called candida.   Vaginal atrophy (atrophic vaginosis) which results from the thinning of the vagina from reduced estrogen levels after menopause.   Trichomoniasis which is caused by a parasite and is commonly transmitted by sexual intercourse.  Factors that increase your risk of developing vaginosis include: Marland Kitchen Medications, such as antibiotics and steroids . Uncontrolled diabetes . Use of hygiene products such as bubble bath, vaginal spray or vaginal deodorant . Douching . Wearing damp or tight-fitting clothing . Using an intrauterine device (IUD) for birth control . Hormonal changes, such as those associated with pregnancy, birth control pills or menopause . Sexual activity . Having a sexually transmitted infection  Your treatment plan is Metronidazole or Flagyl 500mg  twice a day for 7 days. I am also sending in Diflucan to treat the yeast.  I have electronically sent this prescription into the pharmacy that you have chosen.  Be sure to take all of the medication as directed. Stop taking any medication if you develop a rash, tongue swelling or shortness of breath. Mothers who are breast feeding should consider pumping and discarding their breast milk while on these antibiotics. However, there is no consensus that infant exposure at these  doses would be harmful.  Remember that medication creams can weaken latex condoms.   HOME CARE:  Good hygiene may prevent some types of vaginosis from recurring and may relieve some symptoms:  . Avoid baths, hot tubs and whirlpool spas. Rinse soap from your outer genital area after a shower, and dry the area well to prevent irritation. Don't use scented or harsh soaps, such as those with deodorant or antibacterial action. Marland Kitchen Avoid irritants. These include scented tampons and pads. . Wipe from front to back after using the toilet. Doing so avoids spreading fecal bacteria to your vagina.  Other things that may help prevent vaginosis include:  Marland Kitchen Don't douche. Your vagina doesn't require cleansing other than normal bathing. Repetitive douching disrupts the normal organisms that reside in the vagina and can actually increase your risk of vaginal infection. Douching won't clear up a vaginal infection. . Use a latex condom. Both female and female latex condoms may help you avoid infections spread by sexual contact. . Wear cotton underwear. Also wear pantyhose with a cotton crotch. If you feel comfortable without it, skip wearing underwear to bed. Yeast thrives in Mar[place] Kitchen Your symptoms should improve in the next day or two.  GET HELP RIGHT AWAY IF:  . You have pain in your lower abdomen ( pelvic area or over your ovaries) . You develop nausea or vomiting . You develop a fever . Your discharge changes or worsens . You have persistent pain with intercourse . You develop shortness of breath, a rapid pulse, or you faint.  These symptoms could be signs of problems  or infections that need to be evaluated by a medical provider now.  MAKE SURE YOU    Understand these instructions.  Will watch your condition.  Will get help right away if you are not doing well or get worse.  Your e-visit answers were reviewed by a board certified advanced clinical practitioner to complete your  personal care plan. Depending upon the condition, your plan could have included both over the counter or prescription medications. Please review your pharmacy choice to make sure that you have choses a pharmacy that is open for you to pick up any needed prescription, Your safety is important to [place]. If you have drug allergies check your prescription carefully.   You can use MyChart to ask questions about today's visit, request a non-urgent call back, or ask for a work or school excuse for 24 hours related to this e-Visit. If it has been greater than 24 hours you will need to follow up with your provider, or enter a new e-Visit to address those concerns. You will get a MyChart message within the next two days asking about your experience. I hope that your e-visit has been valuable and will speed your recovery.  Greater than 5 minutes, yet less than 10 minutes of time have been spent researching, coordinating, and implementing care for this patient.

## 2020-02-22 ENCOUNTER — Telehealth: Payer: Medicaid Other | Admitting: Nurse Practitioner

## 2020-02-22 DIAGNOSIS — J019 Acute sinusitis, unspecified: Secondary | ICD-10-CM

## 2020-02-22 MED ORDER — FLUCONAZOLE 150 MG PO TABS: 150.0000 mg | ORAL_TABLET | Freq: Once | ORAL | 0 refills | Status: AC

## 2020-02-22 MED ORDER — AMOXICILLIN-POT CLAVULANATE 875-125 MG PO TABS: 1.0000 | ORAL_TABLET | Freq: Two times a day (BID) | ORAL | 0 refills | Status: DC

## 2020-02-22 NOTE — Addendum Note (Signed)
Addended by: Bennie Pierini on: 02/22/2020 01:24 PM   Modules accepted: Orders

## 2020-02-22 NOTE — Progress Notes (Signed)

## 2020-08-22 ENCOUNTER — Telehealth: Payer: Medicaid Other | Admitting: Family Medicine

## 2020-08-22 DIAGNOSIS — R042 Hemoptysis: Secondary | ICD-10-CM

## 2020-08-22 NOTE — Telephone Encounter (Signed)
Please offer patient appt.  Apparently she has some issues going on

## 2020-08-22 NOTE — Progress Notes (Signed)
[place]  Needs in person due to symptoms.

## 2020-08-23 ENCOUNTER — Telehealth: Payer: Self-pay

## 2020-08-23 NOTE — Telephone Encounter (Signed)
Agree if she is feeling really poorly she should be seen emergently.  Thanks for the update.

## 2020-08-23 NOTE — Telephone Encounter (Signed)
-----   Message from Raliegh Ip, DO sent at 08/22/2020  2:46 PM EDT -----    ----- Message ----- From: Freddy Finner, NP Sent: 08/22/2020   1:02 PM EDT To: Raliegh Ip, DO  Advised pt of in person appt needed based off reports of bloody mucus and stiff neck.

## 2020-08-25 ENCOUNTER — Ambulatory Visit (INDEPENDENT_AMBULATORY_CARE_PROVIDER_SITE_OTHER): Payer: Self-pay | Admitting: Family Medicine

## 2020-08-25 ENCOUNTER — Encounter: Payer: Self-pay | Admitting: Family Medicine

## 2020-08-25 DIAGNOSIS — J029 Acute pharyngitis, unspecified: Secondary | ICD-10-CM

## 2020-08-25 DIAGNOSIS — J069 Acute upper respiratory infection, unspecified: Secondary | ICD-10-CM

## 2020-08-25 LAB — RAPID STREP SCREEN (MED CTR MEBANE ONLY): Strep Gp A Ag, IA W/Reflex: NEGATIVE

## 2020-08-25 LAB — CULTURE, GROUP A STREP

## 2020-08-25 MED ORDER — PREDNISONE 20 MG PO TABS: 40.0000 mg | ORAL_TABLET | Freq: Every day | ORAL | 0 refills | Status: AC

## 2020-08-25 NOTE — Progress Notes (Signed)
   Virtual Visit  Note Due to COVID-19 pandemic this visit was conducted virtually. This visit type was conducted due to national recommendations for restrictions regarding the COVID-19 Pandemic (e.g. social distancing, sheltering in place) in an effort to limit this patient's exposure and mitigate transmission in our community. All issues noted in this document were discussed and addressed.  A physical exam was not performed with this format.  I connected with Sydney [name] on 08/25/20 at 0922 by telephone and verified that I am speaking with the correct person using two identifiers. Sydney [name] is currently located at home and no one is currently with her during the visit. The provider, Gabriel Earing, FNP is located in their office at time of visit.  I discussed the limitations, risks, security and privacy concerns of performing an evaluation and management service by telephone and the availability of in person appointments. I also discussed with the patient that there may be a patient responsible charge related to this service. The patient expressed understanding and agreed to proceed.  CC: sore throat  History and Present Illness:  HPI Sydney [name] reports sore throat, cough with green mucus, and congestion for 5 days. She was seen in the ED 2 days ago and was diagnosed with a viral URI. She was given one dose of prednisone and an albuterol inhaler. She had a negative Covid test and negative CXR. She reports white spots on her tonsil so she is concerned about strep throat. She reports some intermittent wheezing and chest tightness in the morning. She has not used the albuterol inhaler. She has been taking theraflu, benadryl, and advil with some improvement. Denies chest pain or systemic symptoms.     ROS As per HPI.  Observations/Objective: Alert and oriented x 3. Able to speak in full sentences without difficulty.   Assessment and Plan: Prajna was seen today for sore throat.  Diagnoses and  all orders for this visit:  Viral URI with cough Continue OTC measures. Prednisone burst as below. Rest, hydration.  -     predniSONE (DELTASONE) 20 MG tablet; Take 2 tablets (40 mg total) by mouth daily with breakfast for 5 days.  Sore throat Rapid strep pending. Will notify patient of results and treat with abx if positive.  -     Rapid Strep Screen ([facility] ONLY)    Follow Up Instructions: Return to office for new or worsening symptoms, or if symptoms persist.     I discussed the assessment and treatment plan with the patient. The patient was provided an opportunity to ask questions and all were answered. The patient agreed with the plan and demonstrated an understanding of the instructions.   The patient was advised to call back or seek an in-person evaluation if the symptoms worsen or if the condition fails to improve as anticipated.  The above assessment and management plan was discussed with the patient. The patient verbalized understanding of and has agreed to the management plan. Patient is aware to call the clinic if symptoms persist or worsen. Patient is aware when to return to the clinic for a follow-up visit. Patient educated on when it is appropriate to go to the emergency department.   Time call ended:  0933  I provided 11 minutes of  non face-to-face time during this encounter.    Gabriel Earing, FNP

## 2020-08-28 ENCOUNTER — Telehealth: Payer: Self-pay | Admitting: Family Medicine

## 2020-08-28 DIAGNOSIS — J988 Other specified respiratory disorders: Secondary | ICD-10-CM

## 2020-08-28 MED ORDER — AMOXICILLIN-POT CLAVULANATE 875-125 MG PO TABS: 1.0000 | ORAL_TABLET | Freq: Two times a day (BID) | ORAL | 0 refills | Status: DC

## 2020-08-28 NOTE — Telephone Encounter (Signed)
Augmentin sent in.  

## 2020-08-28 NOTE — Telephone Encounter (Signed)
Patient aware.

## 2020-08-28 NOTE — Telephone Encounter (Signed)
  Incoming Patient Call  08/28/2020  What symptoms do you have? Sore throat, congested, coughing up green mucus. Had a televisit with Tiffany last Thursday. Tiffany told her if she was no better today she would call her in a ABX.  How long have you been sick? Last Sunday  Have you been seen for this problem? YES  If your provider decides to give you a prescription, which pharmacy would you like for it to be sent to? [facility] in [place]   Patient informed that this information will be sent to the clinical staff for review and that they should receive a follow up call.

## 2021-01-15 ENCOUNTER — Telehealth: Payer: Medicaid Other | Admitting: Family Medicine

## 2021-01-15 DIAGNOSIS — R300 Dysuria: Secondary | ICD-10-CM

## 2021-01-15 MED ORDER — CEPHALEXIN 500 MG PO CAPS: 500.0000 mg | ORAL_CAPSULE | Freq: Two times a day (BID) | ORAL | 0 refills | Status: AC

## 2021-01-15 NOTE — Progress Notes (Signed)

## 2021-02-06 IMAGING — DX RIGHT ANKLE - COMPLETE 3+ VIEW
3 series · 3 of 3 positions shown · non-contrast
Comparison: None.

CLINICAL DATA: Pain after trauma

EXAM:
RIGHT ANKLE - COMPLETE 3+ VIEW

[ankle ap]
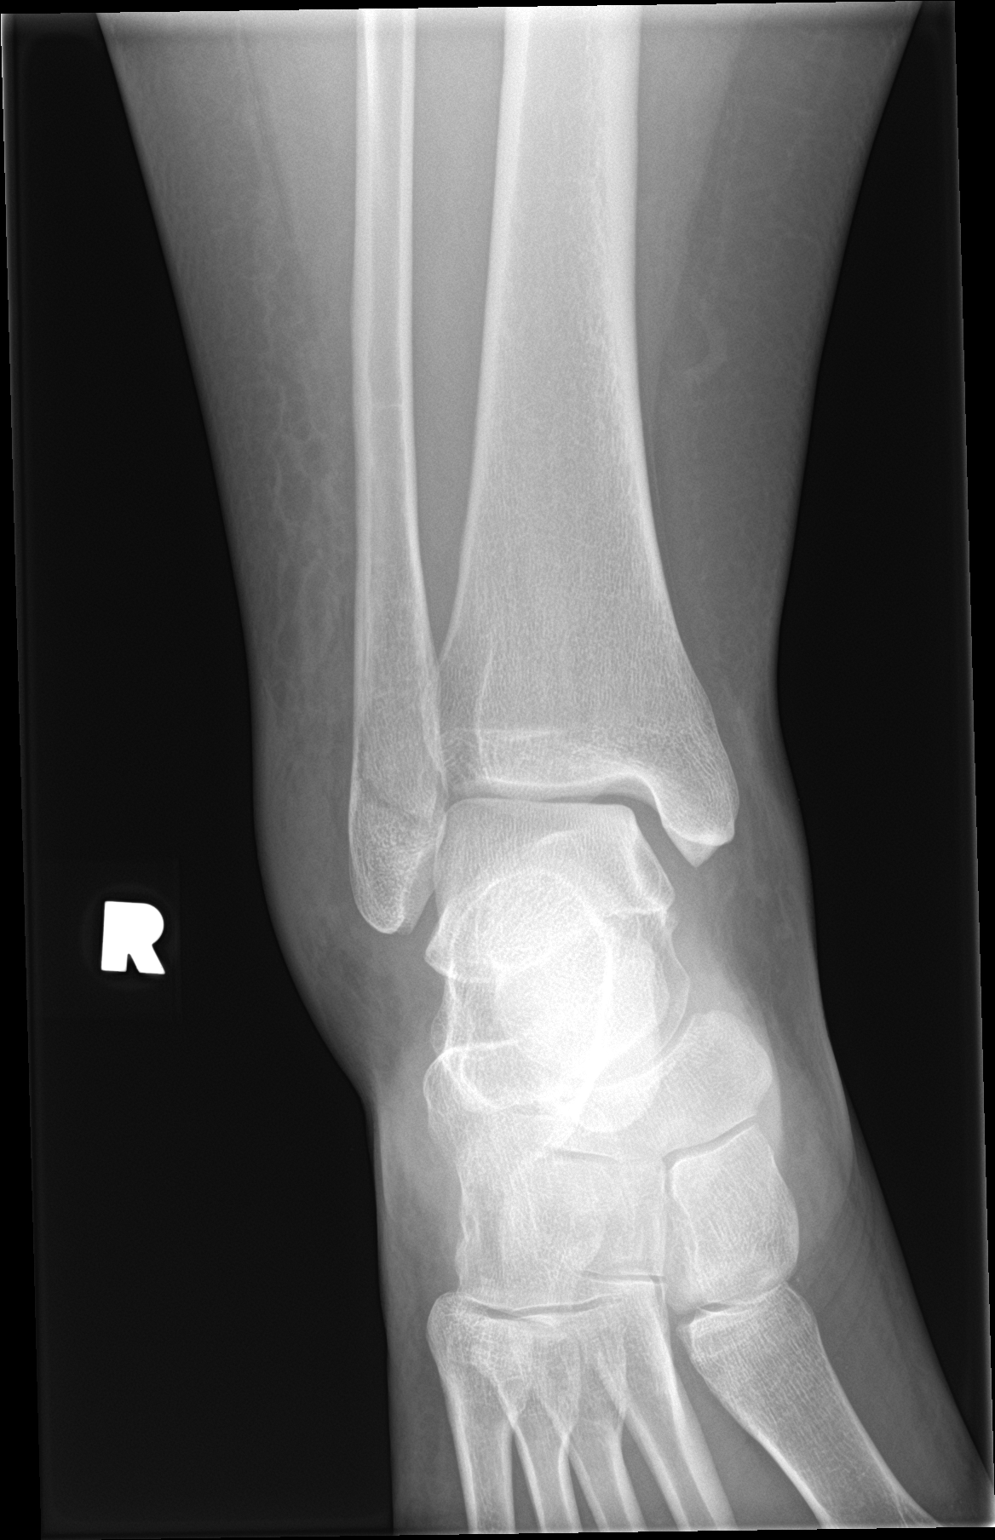

[ankle obl]
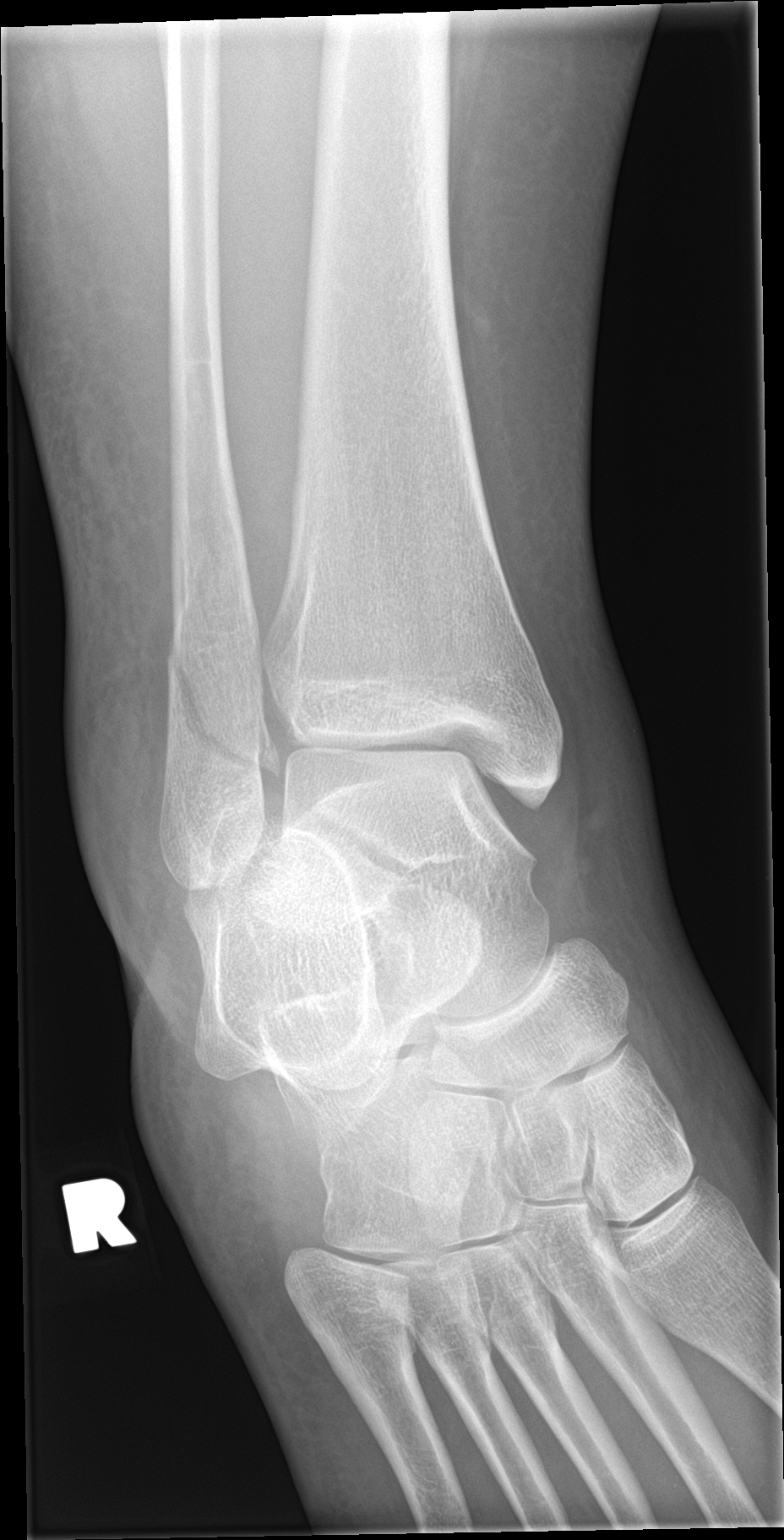

[ankle lat]
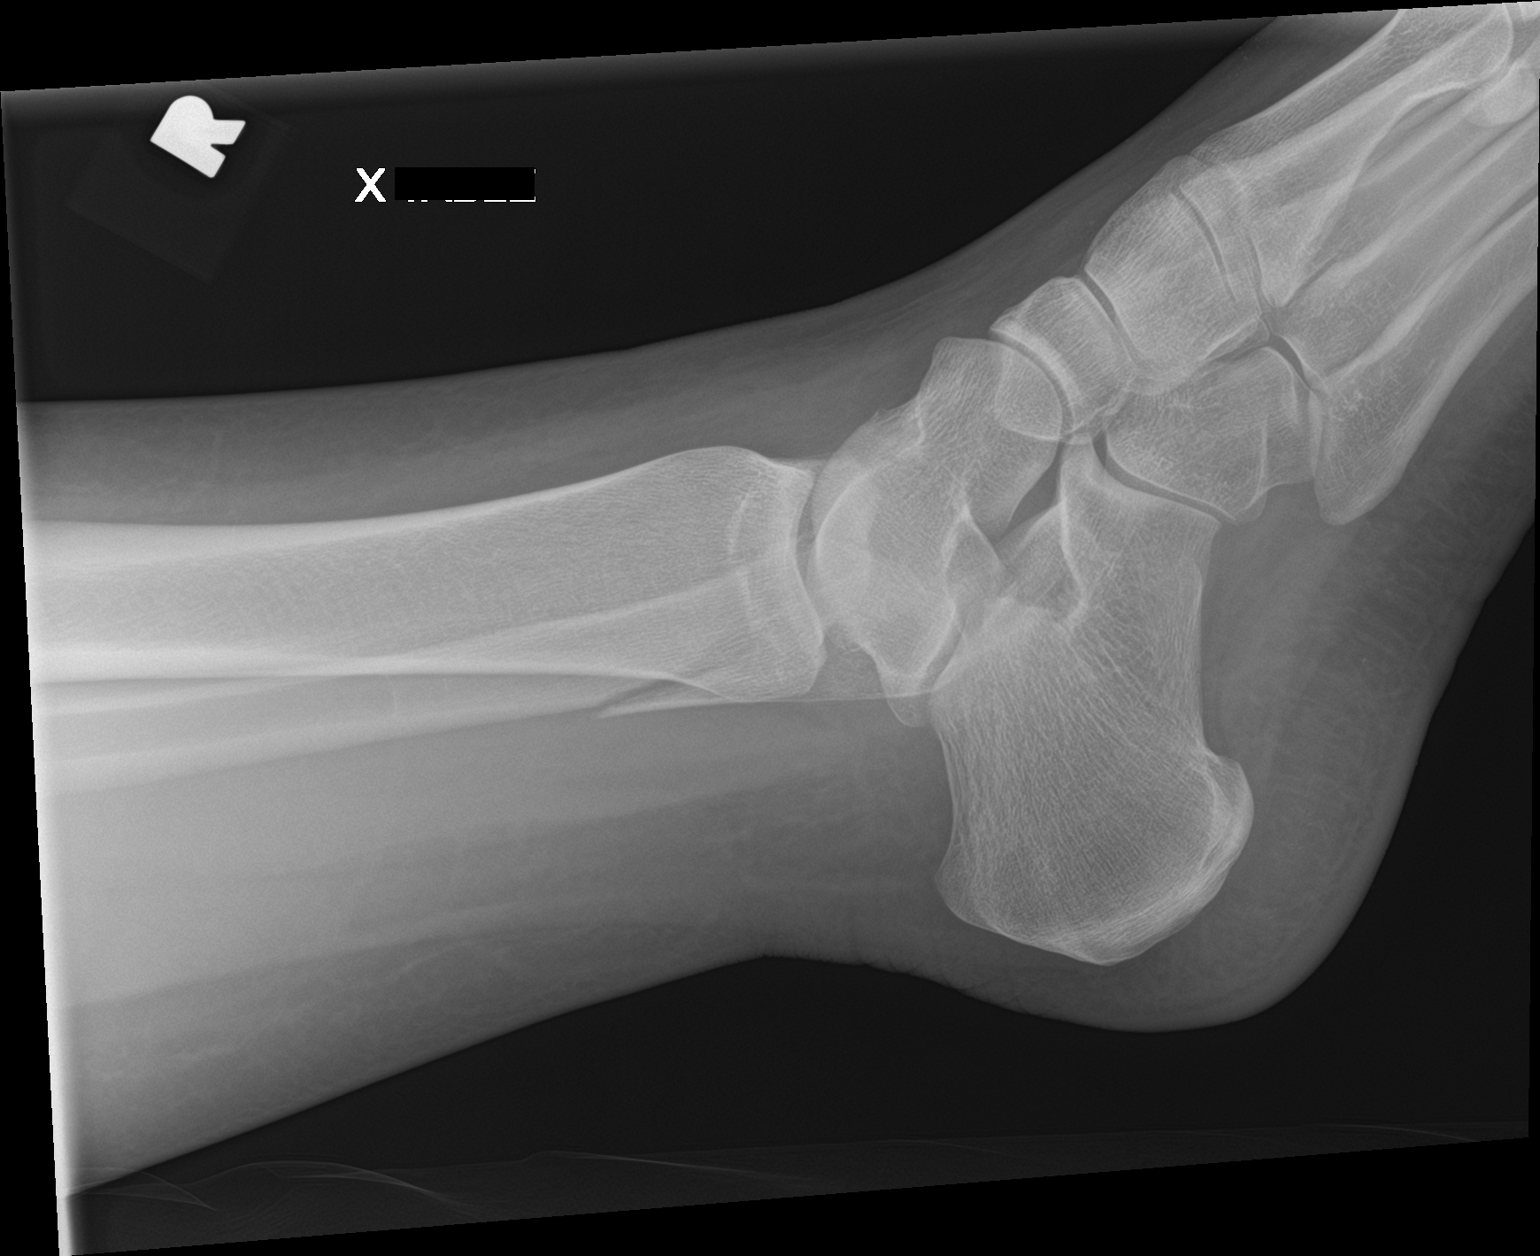

[3 of 3 positions shown; findings below may reference images not displayed]

FINDINGS: Lateral soft tissue swelling is identified. A distal fibular
fracture is noted with mild displacement. No other acute
abnormalities identified.
IMPRESSION: Distal fibular fracture with mild displacement.

## 2021-02-06 IMAGING — DX LEFT FOOT - COMPLETE 3+ VIEW
3 series · 3 of 3 positions shown · non-contrast
Comparison: None.

CLINICAL DATA: Pain after trauma

EXAM:
LEFT FOOT - COMPLETE 3+ VIEW

[foot ap]
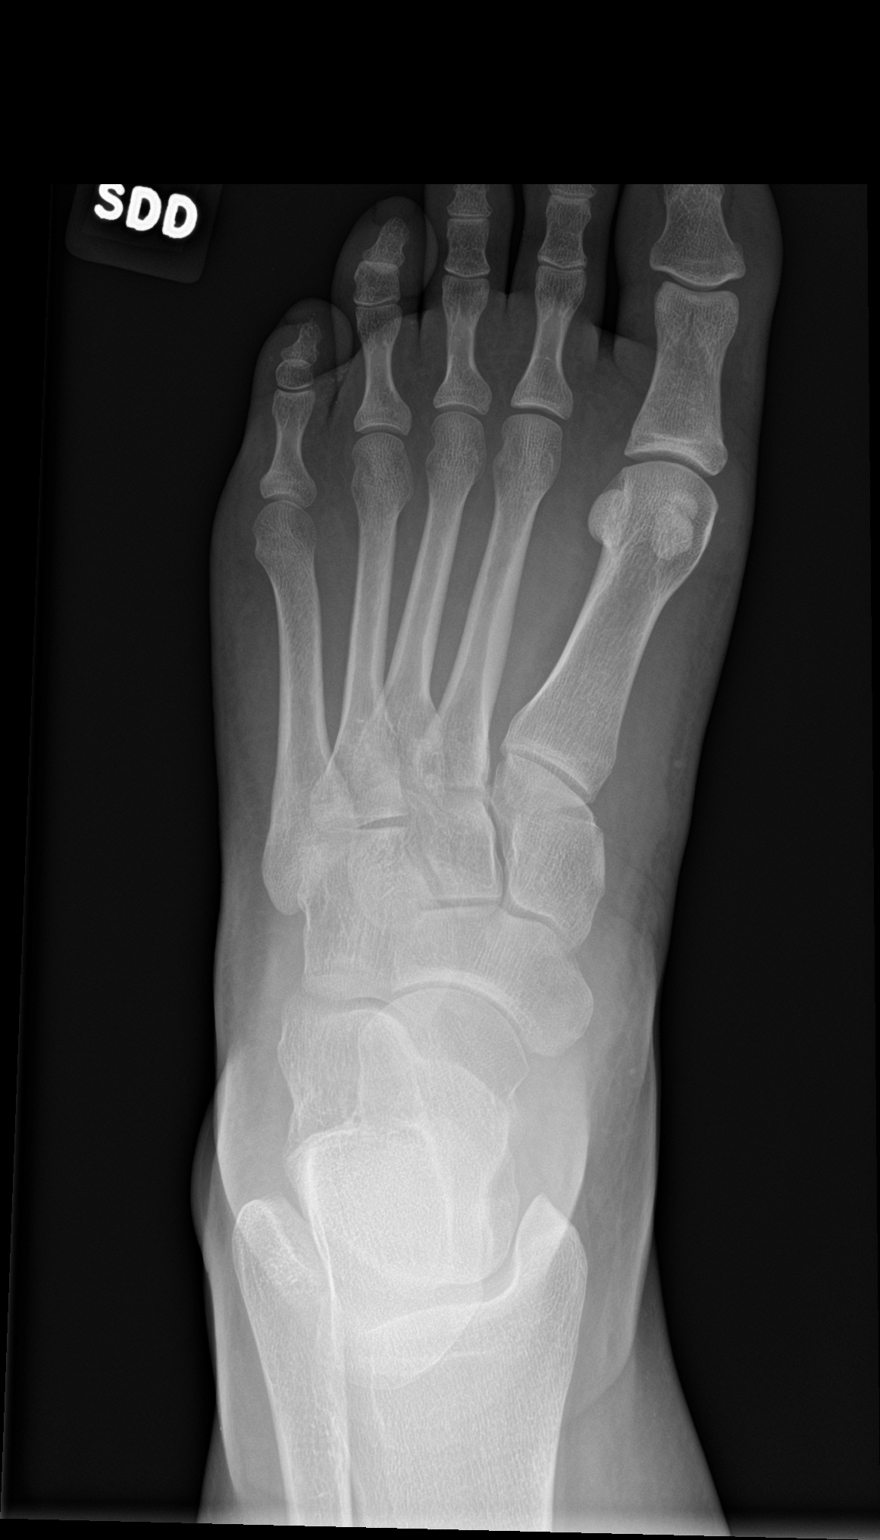

[foot obl]
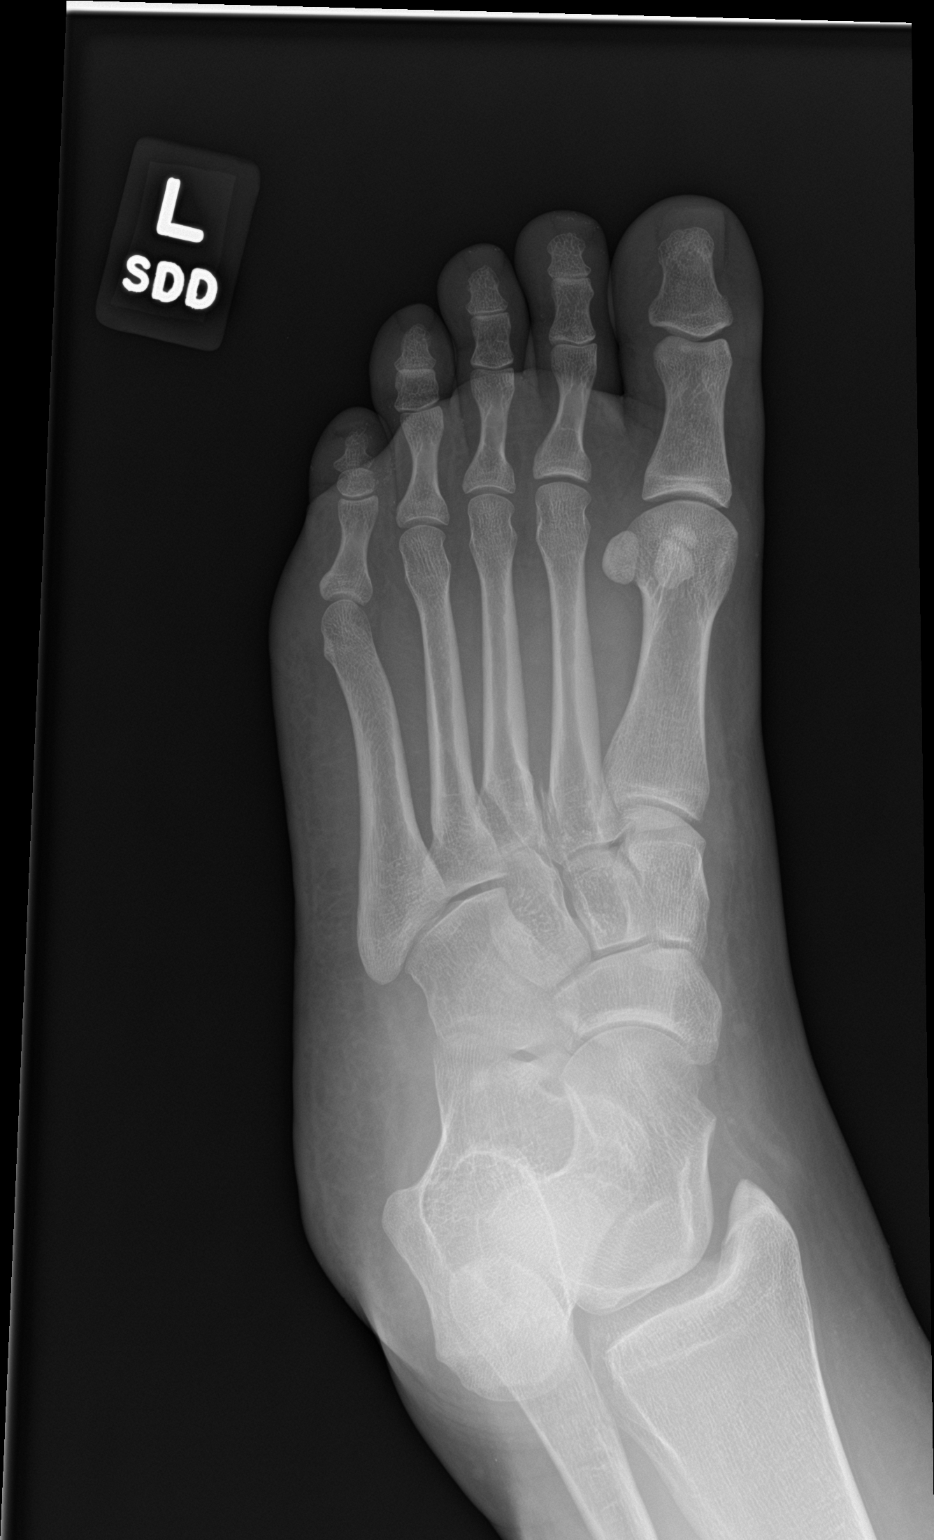

[foot lat]
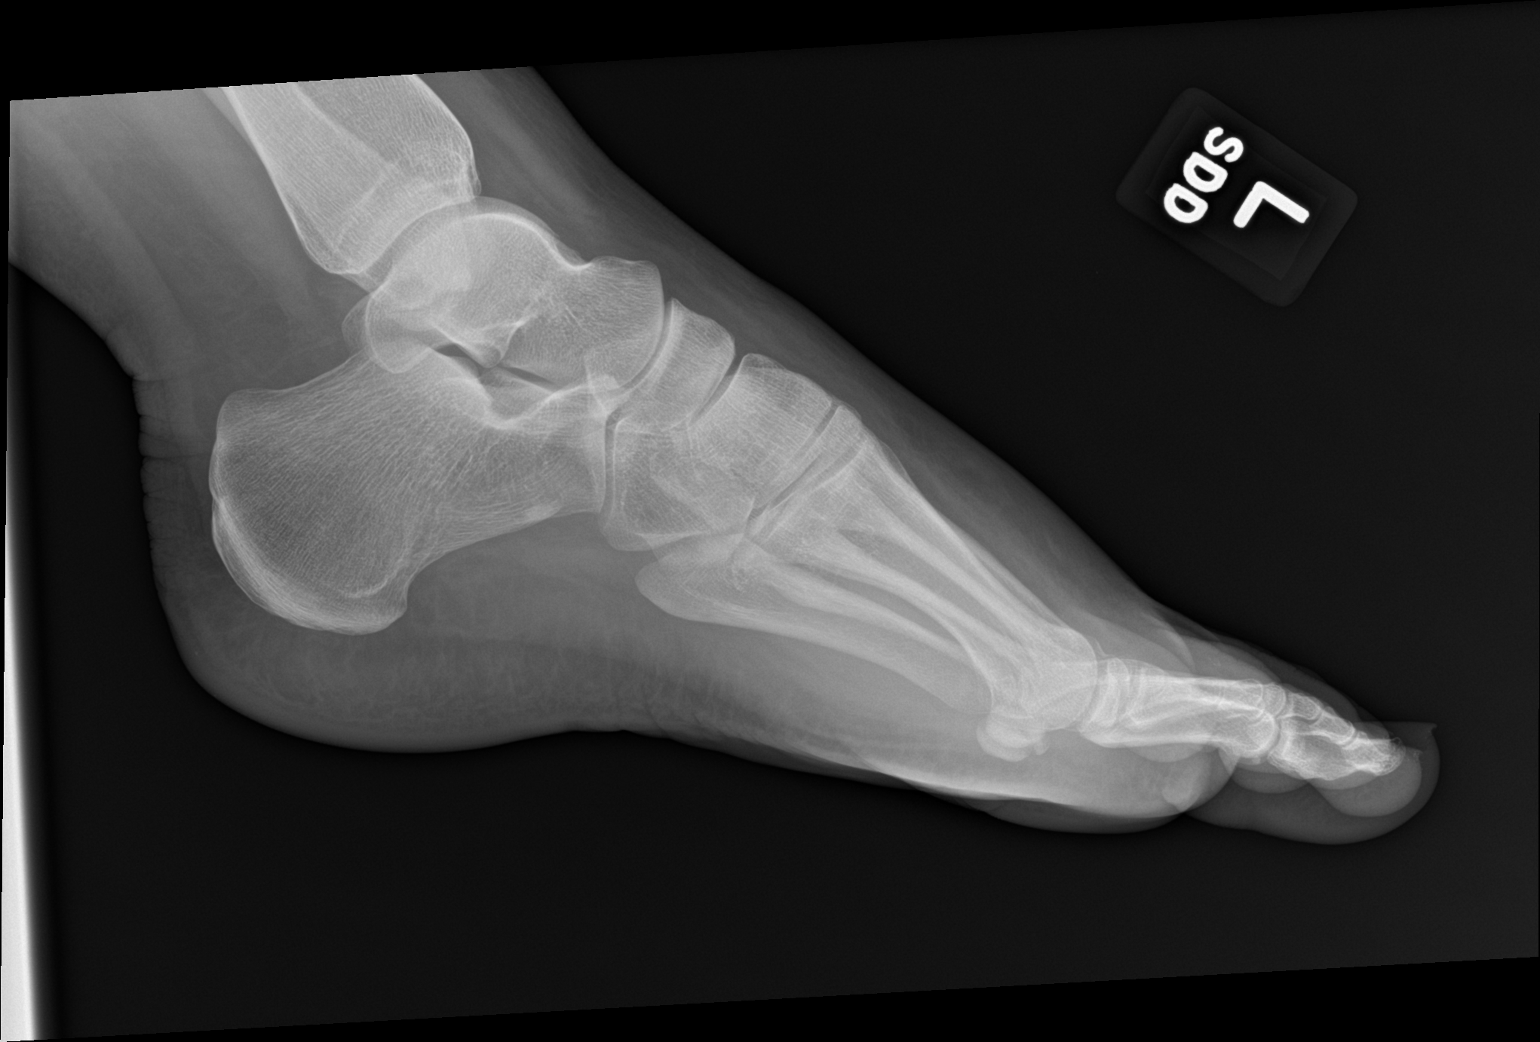

[3 of 3 positions shown; findings below may reference images not displayed]

FINDINGS: There is no evidence of fracture or dislocation. There is no
evidence of arthropathy or other focal bone abnormality. Soft
tissues are unremarkable.
IMPRESSION: Negative.

## 2021-02-10 ENCOUNTER — Telehealth: Payer: Medicaid Other | Admitting: Physician Assistant

## 2021-02-10 DIAGNOSIS — B9689 Other specified bacterial agents as the cause of diseases classified elsewhere: Secondary | ICD-10-CM

## 2021-02-10 DIAGNOSIS — J019 Acute sinusitis, unspecified: Secondary | ICD-10-CM

## 2021-02-10 MED ORDER — FLUCONAZOLE 150 MG PO TABS: 150.0000 mg | ORAL_TABLET | Freq: Once | ORAL | 0 refills | Status: AC

## 2021-02-10 MED ORDER — AMOXICILLIN-POT CLAVULANATE 875-125 MG PO TABS
1.0000 | ORAL_TABLET | Freq: Two times a day (BID) | ORAL | 0 refills | Status: DC
Start: 2021-02-10 — End: 2021-07-24

## 2021-02-10 NOTE — Progress Notes (Signed)
E-Visit for Sinus Problems  We are sorry that you are not feeling well.  Here is how we plan to help!  Based on what you have shared with me it looks like you have sinusitis.  Sinusitis is inflammation and infection in the sinus cavities of the head.  Based on your presentation I believe you most likely have Acute Bacterial Sinusitis.  This is an infection caused by bacteria and is treated with antibiotics. I have prescribed Augmentin 875mg /125mg  one tablet twice daily with food, for 7 days. You may use an oral decongestant such as Mucinex D or if you have glaucoma or high blood pressure use plain Mucinex. Saline nasal spray help and can safely be used as often as needed for congestion.  If you develop worsening sinus pain, fever or notice severe headache and vision changes, or if symptoms are not better after completion of antibiotic, please schedule an appointment with a health care provider.    I have also sent in a Diflucan in case of antibiotic-associated yeast infection.   Sinus infections are not as easily transmitted as other respiratory infection, however we still recommend that you avoid close contact with loved ones, especially the very young and elderly.  Remember to wash your hands thoroughly throughout the day as this is the number one way to prevent the spread of infection!  Home Care: Only take medications as instructed by your medical team. Complete the entire course of an antibiotic. Do not take these medications with alcohol. A steam or ultrasonic humidifier can help congestion.  You can place a towel over your head and breathe in the steam from hot water coming from a faucet. Avoid close contacts especially the very young and the elderly. Cover your mouth when you cough or sneeze. Always remember to wash your hands.  Get Help Right Away If: You develop worsening fever or sinus pain. You develop a severe head ache or visual changes. Your symptoms persist after you have  completed your treatment plan.  Make sure you Understand these instructions. Will watch your condition. Will get help right away if you are not doing well or get worse.  Thank you for choosing an e-visit.  Your e-visit answers were reviewed by a board certified advanced clinical practitioner to complete your personal care plan. Depending upon the condition, your plan could have included both over the counter or prescription medications.  Please review your pharmacy choice. Make sure the pharmacy is open so you can pick up prescription now. If there is a problem, you may contact your provider through and have the prescription routed to another pharmacy.  Your safety is important to [employer]. If you have drug allergies check your prescription carefully.   For the next 24 hours you can use MyChart to ask questions about today's visit, request a non-urgent call back, or ask for a work or school excuse. You will get an email in the next two days asking about your experience. I hope that your e-visit has been valuable and will speed your recovery.

## 2021-02-10 NOTE — Progress Notes (Signed)
I have spent 5 minutes in review of e-visit questionnaire, review and updating patient chart, medical decision making and response to patient.   Alli Jasmer Cody Keanon Bevins, PA-C    

## 2021-07-24 ENCOUNTER — Telehealth: Payer: Medicaid Other | Admitting: Physician Assistant

## 2021-07-24 DIAGNOSIS — J019 Acute sinusitis, unspecified: Secondary | ICD-10-CM

## 2021-07-24 DIAGNOSIS — B9689 Other specified bacterial agents as the cause of diseases classified elsewhere: Secondary | ICD-10-CM

## 2021-07-24 MED ORDER — AMOXICILLIN-POT CLAVULANATE 875-125 MG PO TABS: 1.0000 | ORAL_TABLET | Freq: Two times a day (BID) | ORAL | 0 refills | Status: DC

## 2021-07-24 NOTE — Progress Notes (Signed)

## 2021-07-24 NOTE — Progress Notes (Signed)
I have spent 5 minutes in review of e-visit questionnaire, review and updating patient chart, medical decision making and response to patient.   Isack Lavalley Cody Luay Balding, PA-C    

## 2021-09-16 ENCOUNTER — Telehealth: Payer: Self-pay | Admitting: Nurse Practitioner

## 2021-09-16 DIAGNOSIS — B3731 Acute candidiasis of vulva and vagina: Secondary | ICD-10-CM

## 2021-09-16 MED ORDER — FLUCONAZOLE 150 MG PO TABS: 150.0000 mg | ORAL_TABLET | Freq: Once | ORAL | 1 refills | Status: AC

## 2021-09-16 NOTE — Progress Notes (Signed)
I have spent 5 minutes in review of e-visit questionnaire, review and updating patient chart, medical decision making and response to patient.  ° °Sydney [name] W Dorsey Charette, NP ° °  °

## 2021-09-16 NOTE — Progress Notes (Signed)

## 2021-09-27 ENCOUNTER — Telehealth: Payer: Self-pay | Admitting: Family Medicine

## 2021-09-27 DIAGNOSIS — H9201 Otalgia, right ear: Secondary | ICD-10-CM

## 2021-09-27 MED ORDER — CIPRO HC 0.2-1 % OT SUSP: 3.0000 [drp] | Freq: Two times a day (BID) | OTIC | 0 refills | Status: DC

## 2021-09-27 MED ORDER — NEOMYCIN-POLYMYXIN-HC 3.5-10000-1 OT SOLN: 4.0000 [drp] | Freq: Four times a day (QID) | OTIC | 0 refills | Status: DC

## 2021-09-27 NOTE — Progress Notes (Addendum)
E Visit for Swimmer's Ear  We are sorry that you are not feeling well. Here is how we plan to help!  I have prescribed: Neomycin 0.35%, polymyxin B 10,000 units/mL, and hydrocortisone 0,5% otic solution 4 drops in affected ears four times a day for 7 days    In certain cases swimmer's ear may progress to a more serious bacterial infection of the middle or inner ear.  If you have a fever 102 and up and significantly worsening symptoms, this could indicate a more serious infection moving to the middle/inner and needs face to face evaluation in an office by a provider.  Your symptoms should improve over the next 3 days and should resolve in about 7 days.  HOME CARE:  Wash your hands frequently. Do not place the tip of the bottle on your ear or touch it with your fingers. You can take Acetominophen 650 mg every 4-6 hours as needed for pain.  If pain is severe or moderate, you can apply a heating pad (set on low) or hot water bottle (wrapped in a towel) to outer ear for 20 minutes.  This will also increase drainage. Avoid ear plugs Do not use Q-tips After showers, help the water run out by tilting your head to one side.  GET HELP RIGHT AWAY IF:  Fever is over 102.2 degrees. You develop progressive ear pain or hearing loss. Ear symptoms persist longer than 3 days after treatment.  MAKE SURE YOU:  Understand these instructions. Will watch your condition. Will get help right away if you are not doing well or get worse.  TO PREVENT SWIMMER'S EAR: Use a bathing cap or custom fitted swim molds to keep your ears dry. Towel off after swimming to dry your ears. Tilt your head or pull your earlobes to allow the water to escape your ear canal. If there is still water in your ears, consider using a hairdryer on the lowest setting.   Thank you for choosing an e-visit.  Your e-visit answers were reviewed by a board certified advanced clinical practitioner to complete your personal care plan.  Depending upon the condition, your plan could have included both over the counter or prescription medications.  Please review your pharmacy choice. Make sure the pharmacy is open so you can pick up prescription now. If there is a problem, you may contact your provider through MyChart messaging and have the prescription routed to another pharmacy.  Your safety is important to us. If you have drug allergies check your prescription carefully.   For the next 24 hours you can use MyChart to ask questions about today's visit, request a non-urgent call back, or ask for a work or school excuse. You will get an email in the next two days asking about your experience. I hope that your e-visit has been valuable and will speed your recovery.   I provided 5  minutes of non face-to-face time during this encounter for chart review, medication and order placement, as well as and documentation.   

## 2021-09-27 NOTE — Addendum Note (Signed)
Addended by: Freddy Finner on: 09/27/2021 10:56 AM   Modules accepted: Orders

## 2021-10-10 ENCOUNTER — Encounter: Payer: Self-pay | Admitting: Family Medicine

## 2021-10-10 ENCOUNTER — Telehealth: Payer: Self-pay | Admitting: Family Medicine

## 2021-10-10 DIAGNOSIS — J019 Acute sinusitis, unspecified: Secondary | ICD-10-CM

## 2021-10-10 DIAGNOSIS — B9689 Other specified bacterial agents as the cause of diseases classified elsewhere: Secondary | ICD-10-CM

## 2021-10-10 MED ORDER — DOXYCYCLINE HYCLATE 100 MG PO TABS: 100.0000 mg | ORAL_TABLET | Freq: Two times a day (BID) | ORAL | 0 refills | Status: AC

## 2021-10-10 NOTE — Progress Notes (Signed)

## 2021-11-15 ENCOUNTER — Telehealth: Payer: Self-pay | Admitting: Family Medicine

## 2021-11-15 DIAGNOSIS — B3731 Acute candidiasis of vulva and vagina: Secondary | ICD-10-CM

## 2021-11-15 MED ORDER — FLUCONAZOLE 150 MG PO TABS: 150.0000 mg | ORAL_TABLET | Freq: Once | ORAL | 0 refills | Status: AC

## 2021-11-15 NOTE — Progress Notes (Signed)

## 2022-03-17 ENCOUNTER — Telehealth: Payer: Self-pay | Admitting: Family

## 2022-03-17 DIAGNOSIS — J019 Acute sinusitis, unspecified: Secondary | ICD-10-CM

## 2022-03-17 MED ORDER — AMOXICILLIN-POT CLAVULANATE 875-125 MG PO TABS: 1.0000 | ORAL_TABLET | Freq: Two times a day (BID) | ORAL | 0 refills | Status: DC

## 2022-03-17 NOTE — Progress Notes (Signed)

## 2022-03-22 ENCOUNTER — Telehealth: Payer: Self-pay | Admitting: Physician Assistant

## 2022-03-22 DIAGNOSIS — B379 Candidiasis, unspecified: Secondary | ICD-10-CM

## 2022-03-22 DIAGNOSIS — T3695XA Adverse effect of unspecified systemic antibiotic, initial encounter: Secondary | ICD-10-CM

## 2022-03-22 MED ORDER — FLUCONAZOLE 150 MG PO TABS: 150.0000 mg | ORAL_TABLET | ORAL | 0 refills | Status: DC | PRN

## 2022-03-22 NOTE — Progress Notes (Signed)
E-Visit for Vaginal Symptoms  We are sorry that you are not feeling well. Here is how we plan to help! Based on what you shared with me it looks like you: May have a yeast vaginosis secondary to antibiotics.   Vaginosis is an inflammation of the vagina that can result in discharge, itching and pain. The cause is usually a change in the normal balance of vaginal bacteria or an infection. Vaginosis can also result from reduced estrogen levels after menopause.  The most common causes of vaginosis are:   Bacterial vaginosis which results from an overgrowth of one on several organisms that are normally present in your vagina.   Yeast infections which are caused by a naturally occurring fungus called candida.   Vaginal atrophy (atrophic vaginosis) which results from the thinning of the vagina from reduced estrogen levels after menopause.   Trichomoniasis which is caused by a parasite and is commonly transmitted by sexual intercourse.  Factors that increase your risk of developing vaginosis include: Medications, such as antibiotics and steroids Uncontrolled diabetes Use of hygiene products such as bubble bath, vaginal spray or vaginal deodorant Douching Wearing damp or tight-fitting clothing Using an intrauterine device (IUD) for birth control Hormonal changes, such as those associated with pregnancy, birth control pills or menopause Sexual activity Having a sexually transmitted infection  Your treatment plan is Diflucan (fluconazole) 143m tablet once, repeat once antibiotic completed.  I have electronically sent this prescription into the pharmacy that you have chosen.  Be sure to take all of the medication as directed. Stop taking any medication if you develop a rash, tongue swelling or shortness of breath. Mothers who are breast feeding should consider pumping and discarding their breast milk while on these antibiotics. However, there is no consensus that infant exposure at these doses  would be harmful.  Remember that medication creams can weaken latex condoms. .Marland Kitchen  HOME CARE:  Good hygiene may prevent some types of vaginosis from recurring and may relieve some symptoms:  Avoid baths, hot tubs and whirlpool spas. Rinse soap from your outer genital area after a shower, and dry the area well to prevent irritation. Don't use scented or harsh soaps, such as those with deodorant or antibacterial action. Avoid irritants. These include scented tampons and pads. Wipe from front to back after using the toilet. Doing so avoids spreading fecal bacteria to your vagina.  Other things that may help prevent vaginosis include:  Don't douche. Your vagina doesn't require cleansing other than normal bathing. Repetitive douching disrupts the normal organisms that reside in the vagina and can actually increase your risk of vaginal infection. Douching won't clear up a vaginal infection. Use a latex condom. Both female and female latex condoms may help you avoid infections spread by sexual contact. Wear cotton underwear. Also wear pantyhose with a cotton crotch. If you feel comfortable without it, skip wearing underwear to bed. Yeast thrives in mCampbell SoupYour symptoms should improve in the next day or two.  GET HELP RIGHT AWAY IF:  You have pain in your lower abdomen ( pelvic area or over your ovaries) You develop nausea or vomiting You develop a fever Your discharge changes or worsens You have persistent pain with intercourse You develop shortness of breath, a rapid pulse, or you faint.  These symptoms could be signs of problems or infections that need to be evaluated by a medical provider now.  MAKE SURE YOU   Understand these instructions. Will watch your condition. Will get help  right away if you are not doing well or get worse.  Thank you for choosing an e-visit.  Your e-visit answers were reviewed by a board certified advanced clinical practitioner to complete your  personal care plan. Depending upon the condition, your plan could have included both over the counter or prescription medications.  Please review your pharmacy choice. Make sure the pharmacy is open so you can pick up prescription now. If there is a problem, you may contact your provider through [employer] and have the prescription routed to another pharmacy.  Your safety is important to [place]. If you have drug allergies check your prescription carefully.   For the next 24 hours you can use MyChart to ask questions about today's visit, request a non-urgent call back, or ask for a work or school excuse. You will get an email in the next two days asking about your experience. I hope that your e-visit has been valuable and will speed your recovery.   I have spent 5 minutes in review of e-visit questionnaire, review and updating patient chart, medical decision making and response to patient.   Mar Daring, PA-C

## 2022-04-03 ENCOUNTER — Telehealth: Payer: Self-pay | Admitting: Physician Assistant

## 2022-04-03 DIAGNOSIS — H109 Unspecified conjunctivitis: Secondary | ICD-10-CM

## 2022-04-03 MED ORDER — POLYMYXIN B-TRIMETHOPRIM 10000-0.1 UNIT/ML-% OP SOLN: 1.0000 [drp] | OPHTHALMIC | 0 refills | Status: DC

## 2022-04-03 NOTE — Progress Notes (Signed)

## 2022-04-05 ENCOUNTER — Telehealth: Payer: Medicaid Other | Admitting: Physician Assistant

## 2022-04-05 DIAGNOSIS — R3989 Other symptoms and signs involving the genitourinary system: Secondary | ICD-10-CM

## 2022-04-05 NOTE — Progress Notes (Signed)
Because of concern for a resistant bacteria causing a bladder infection, giving recent antibiotic use, I feel your condition warrants further evaluation and I recommend that you be seen in a face to face visit. This is mainly because of need for a urine culture to make sure the most effective antibiotic regimen is given so that this does not progress to a more complicated infection. I would first recommend reaching out to your PCP. If unavailable, you can me seen in person today at one of our in-person urgent cares.    NOTE: There will be NO CHARGE for this eVisit   If you are having a true medical emergency please call 911.      For an urgent face to face visit, [place] has eight urgent care centers for your convenience:   NEW!! [facility] [place] [facility] [place] Get Driving Directions [id number] [address] [place], [place] [facility] [place] [facility] [place] Get Driving Directions [id number] [place], [place] [address]   [facility]) Get Driving Directions [id number] [address], [place], [place] [address]  [facility] [place] ([place]) Get Driving Directions [id number] [address] [place],  [place]  [address]  [place] [facility] - at [address] Get Driving Directions  [phone] [address] [place],  [place] [address]   [facility] Get Driving Directions [id number] [place], [place], [place] [address]   [facility] Get Driving Directions  [id number] [address] [place], [place] [address]   [facility] [place] Get Driving Directions [id number] [address], [place],  [address]  Your MyChart E-visit questionnaire  answers were reviewed by a board certified advanced clinical practitioner to complete your personal care plan based on your specific symptoms.  Thank you for using e-Visits.

## 2022-05-13 ENCOUNTER — Telehealth: Payer: Self-pay | Admitting: Family Medicine

## 2022-05-13 DIAGNOSIS — J302 Other seasonal allergic rhinitis: Secondary | ICD-10-CM

## 2022-05-13 NOTE — Progress Notes (Signed)
E visit for Allergic Rhinitis We are sorry that you are not feeling well.  Here is how we plan to help!  Based on what you have shared with me it looks like you have Allergic Rhinitis.  Rhinitis is when a reaction occurs that causes nasal congestion, runny nose, sneezing, and itching.  Most types of rhinitis are caused by an inflammation and are associated with symptoms in the eyes ears or throat. There are several types of rhinitis.  The most common are acute rhinitis, which is usually caused by a viral illness, allergic or seasonal rhinitis, and nonallergic or year-round rhinitis.  Nasal allergies occur certain times of the year.  Allergic rhinitis is caused when allergens in the air trigger the release of histamine in the body.  Histamine causes itching, swelling, and fluid to build up in the fragile linings of the nasal passages, sinuses and eyelids.  An itchy nose and clear discharge are common.  I recommend the following over the counter treatments: You should take a daily dose of antihistamine and Xyzal 5 mg take 1 tablet daily  I also would recommend a nasal spray: Flonase 2 sprays into each nostril once daily and Saline 1 spray into each nostril as needed  You may also benefit from eye drops such as: Systane 1-2 driops each eye twice daily as needed  HOME CARE:  You can use an over-the-counter saline nasal spray as needed Avoid areas where there is heavy dust, mites, or molds Stay indoors on windy days during the pollen season Keep windows closed in home, at least in bedroom; use air conditioner. Use high-efficiency house air filter Keep windows closed in car, turn AC on re-circulate Avoid playing out with dog during pollen season  GET HELP RIGHT AWAY IF:  If your symptoms do not improve within 10 days You become short of breath You develop yellow or green discharge from your nose for over 3 days You have coughing fits  MAKE SURE YOU:  Understand these instructions Will  watch your condition Will get help right away if you are not doing well or get worse  Thank you for choosing an e-visit. Your e-visit answers were reviewed by a board certified advanced clinical practitioner to complete your personal care plan. Depending upon the condition, your plan could have included both over the counter or prescription medications. Please review your pharmacy choice. Be sure that the pharmacy you have chosen is open so that you can pick up your prescription now.  If there is a problem you may message your provider in [place] to have the prescription routed to another pharmacy. Your safety is important to [place]. If you have drug allergies check your prescription carefully.  For the next 24 hours, you can use MyChart to ask questions about today's visit, request a non-urgent call back, or ask for a work or school excuse from your e-visit provider. You will get an email in the next two days asking about your experience. I hope that your e-visit has been valuable and will speed your recovery.  I provided 5 minutes of non face-to-face time during this encounter for chart review, medication and order placement, as well as and documentation.        I provided 5 minutes of non face-to-face time during this encounter for chart review, medication and order placement, as well as and documentation.

## 2022-08-02 ENCOUNTER — Telehealth: Payer: Self-pay | Admitting: Physician Assistant

## 2022-08-02 DIAGNOSIS — R3989 Other symptoms and signs involving the genitourinary system: Secondary | ICD-10-CM

## 2022-08-02 MED ORDER — NITROFURANTOIN MONOHYD MACRO 100 MG PO CAPS: 100.0000 mg | ORAL_CAPSULE | Freq: Two times a day (BID) | ORAL | 0 refills | Status: DC

## 2022-08-02 NOTE — Progress Notes (Signed)
E-Visit for Urinary Problems  We are sorry that you are not feeling well.  Here is how we plan to help!  Based on what you shared with me it looks like you most likely have a simple urinary tract infection.  A UTI (Urinary Tract Infection) is a bacterial infection of the bladder.  Most cases of urinary tract infections are simple to treat but a key part of your care is to encourage you to drink plenty of fluids and watch your symptoms carefully.  I have prescribed MacroBid 100 mg twice a day for 5 days.  Your symptoms should gradually improve. Call us if the burning in your urine worsens, you develop worsening fever, back pain or pelvic pain or if your symptoms do not resolve after completing the antibiotic.  Urinary tract infections can be prevented by drinking plenty of water to keep your body hydrated.  Also be sure when you wipe, wipe from front to back and don't hold it in!  If possible, empty your bladder every 4 hours.  HOME CARE Drink plenty of fluids Compete the full course of the antibiotics even if the symptoms resolve Remember, when you need to go.go. Holding in your urine can increase the likelihood of getting a UTI! GET HELP RIGHT AWAY IF: You cannot urinate You get a high fever Worsening back pain occurs You see blood in your urine You feel sick to your stomach or throw up You feel like you are going to pass out  MAKE SURE YOU  Understand these instructions. Will watch your condition. Will get help right away if you are not doing well or get worse.   Thank you for choosing an e-visit.  Your e-visit answers were reviewed by a board certified advanced clinical practitioner to complete your personal care plan. Depending upon the condition, your plan could have included both over the counter or prescription medications.  Please review your pharmacy choice. Make sure the pharmacy is open so you can pick up prescription now. If there is a problem, you may contact your  provider through MyChart messaging and have the prescription routed to another pharmacy.  Your safety is important to us. If you have drug allergies check your prescription carefully.   For the next 24 hours you can use MyChart to ask questions about today's visit, request a non-urgent call back, or ask for a work or school excuse. You will get an email in the next two days asking about your experience. I hope that your e-visit has been valuable and will speed your recovery.  I have spent 5 minutes in review of e-visit questionnaire, review and updating patient chart, medical decision making and response to patient.   Dewey Neukam M Iria Jamerson, PA-C  

## 2022-11-15 ENCOUNTER — Telehealth: Payer: Self-pay | Admitting: Physician Assistant

## 2022-11-15 DIAGNOSIS — R3989 Other symptoms and signs involving the genitourinary system: Secondary | ICD-10-CM

## 2022-11-15 MED ORDER — NITROFURANTOIN MONOHYD MACRO 100 MG PO CAPS: 100.0000 mg | ORAL_CAPSULE | Freq: Two times a day (BID) | ORAL | 0 refills | Status: DC

## 2022-11-15 NOTE — Progress Notes (Signed)
E-Visit for Urinary Problems  We are sorry that you are not feeling well.  Here is how we plan to help!  Based on what you shared with me it looks like you most likely have a simple urinary tract infection.  A UTI (Urinary Tract Infection) is a bacterial infection of the bladder.  Most cases of urinary tract infections are simple to treat but a key part of your care is to encourage you to drink plenty of fluids and watch your symptoms carefully.  I have prescribed MacroBid 100 mg twice a day for 5 days.  Your symptoms should gradually improve. Call us if the burning in your urine worsens, you develop worsening fever, back pain or pelvic pain or if your symptoms do not resolve after completing the antibiotic.  Urinary tract infections can be prevented by drinking plenty of water to keep your body hydrated.  Also be sure when you wipe, wipe from front to back and don't hold it in!  If possible, empty your bladder every 4 hours.  HOME CARE Drink plenty of fluids Compete the full course of the antibiotics even if the symptoms resolve Remember, when you need to go.go. Holding in your urine can increase the likelihood of getting a UTI! GET HELP RIGHT AWAY IF: You cannot urinate You get a high fever Worsening back pain occurs You see blood in your urine You feel sick to your stomach or throw up You feel like you are going to pass out  MAKE SURE YOU  Understand these instructions. Will watch your condition. Will get help right away if you are not doing well or get worse.   Thank you for choosing an e-visit.  Your e-visit answers were reviewed by a board certified advanced clinical practitioner to complete your personal care plan. Depending upon the condition, your plan could have included both over the counter or prescription medications.  Please review your pharmacy choice. Make sure the pharmacy is open so you can pick up prescription now. If there is a problem, you may contact your  provider through MyChart messaging and have the prescription routed to another pharmacy.  Your safety is important to us. If you have drug allergies check your prescription carefully.   For the next 24 hours you can use MyChart to ask questions about today's visit, request a non-urgent call back, or ask for a work or school excuse. You will get an email in the next two days asking about your experience. I hope that your e-visit has been valuable and will speed your recovery.  I have spent 5 minutes in review of e-visit questionnaire, review and updating patient chart, medical decision making and response to patient.   Yavier Snider M Heberto Sturdevant, PA-C  

## 2023-01-25 ENCOUNTER — Telehealth: Payer: Self-pay | Admitting: Physician Assistant

## 2023-01-25 DIAGNOSIS — J019 Acute sinusitis, unspecified: Secondary | ICD-10-CM

## 2023-01-25 DIAGNOSIS — B9689 Other specified bacterial agents as the cause of diseases classified elsewhere: Secondary | ICD-10-CM

## 2023-01-25 MED ORDER — FLUCONAZOLE 150 MG PO TABS
150.0000 mg | ORAL_TABLET | Freq: Once | ORAL | 0 refills | Status: AC
Start: 2023-01-25 — End: 2023-01-25

## 2023-01-25 MED ORDER — AMOXICILLIN-POT CLAVULANATE 875-125 MG PO TABS: 1.0000 | ORAL_TABLET | Freq: Two times a day (BID) | ORAL | 0 refills | Status: AC

## 2023-01-25 NOTE — Progress Notes (Signed)

## 2023-01-25 NOTE — Progress Notes (Signed)
I have spent 5 minutes in review of e-visit questionnaire, review and updating patient chart, medical decision making and response to patient.   Laure Kidney, PA-C

## 2023-05-01 ENCOUNTER — Telehealth: Payer: Self-pay | Admitting: Physician Assistant

## 2023-05-01 DIAGNOSIS — T3695XA Adverse effect of unspecified systemic antibiotic, initial encounter: Secondary | ICD-10-CM

## 2023-05-01 DIAGNOSIS — B9689 Other specified bacterial agents as the cause of diseases classified elsewhere: Secondary | ICD-10-CM

## 2023-05-01 DIAGNOSIS — J019 Acute sinusitis, unspecified: Secondary | ICD-10-CM

## 2023-05-01 DIAGNOSIS — B379 Candidiasis, unspecified: Secondary | ICD-10-CM

## 2023-05-01 MED ORDER — AMOXICILLIN-POT CLAVULANATE 875-125 MG PO TABS: 1.0000 | ORAL_TABLET | Freq: Two times a day (BID) | ORAL | 0 refills | Status: DC

## 2023-05-01 MED ORDER — FLUCONAZOLE 150 MG PO TABS: 150.0000 mg | ORAL_TABLET | ORAL | 0 refills | Status: DC | PRN

## 2023-05-01 NOTE — Progress Notes (Signed)

## 2023-05-22 ENCOUNTER — Telehealth: Payer: Self-pay | Admitting: Physician Assistant

## 2023-05-22 DIAGNOSIS — R3989 Other symptoms and signs involving the genitourinary system: Secondary | ICD-10-CM

## 2023-05-23 MED ORDER — NITROFURANTOIN MONOHYD MACRO 100 MG PO CAPS
100.0000 mg | ORAL_CAPSULE | Freq: Two times a day (BID) | ORAL | 0 refills | Status: DC
Start: 2023-05-23 — End: 2023-08-05

## 2023-05-23 NOTE — Progress Notes (Signed)

## 2023-08-05 ENCOUNTER — Telehealth: Payer: Self-pay | Admitting: Physician Assistant

## 2023-08-05 DIAGNOSIS — R3989 Other symptoms and signs involving the genitourinary system: Secondary | ICD-10-CM

## 2023-08-05 MED ORDER — CEPHALEXIN 500 MG PO CAPS: 500.0000 mg | ORAL_CAPSULE | Freq: Two times a day (BID) | ORAL | 0 refills | Status: AC

## 2023-08-05 NOTE — Progress Notes (Signed)

## 2023-08-05 NOTE — Progress Notes (Signed)
 I have spent 5 minutes in review of e-visit questionnaire, review and updating patient chart, medical decision making and response to patient.   Piedad Climes, PA-C
